# Patient Record
Sex: Female | Born: 1955 | Race: White | Hispanic: No | Smoking: Former smoker
Health system: Southern US, Community
[De-identification: ages and names within clinical notes are randomized; demographics above are authoritative.]

## PROBLEM LIST (undated history)

## (undated) DIAGNOSIS — F329 Major depressive disorder, single episode, unspecified: Secondary | ICD-10-CM

## (undated) DIAGNOSIS — F319 Bipolar disorder, unspecified: Secondary | ICD-10-CM

## (undated) DIAGNOSIS — R079 Chest pain, unspecified: Secondary | ICD-10-CM

## (undated) DIAGNOSIS — I10 Essential (primary) hypertension: Secondary | ICD-10-CM

## (undated) DIAGNOSIS — Z950 Presence of cardiac pacemaker: Secondary | ICD-10-CM

## (undated) DIAGNOSIS — G4700 Insomnia, unspecified: Secondary | ICD-10-CM

## (undated) DIAGNOSIS — M19049 Primary osteoarthritis, unspecified hand: Secondary | ICD-10-CM

## (undated) DIAGNOSIS — F321 Major depressive disorder, single episode, moderate: Secondary | ICD-10-CM

## (undated) DIAGNOSIS — G8929 Other chronic pain: Secondary | ICD-10-CM

## (undated) DIAGNOSIS — E785 Hyperlipidemia, unspecified: Secondary | ICD-10-CM

## (undated) DIAGNOSIS — I5020 Unspecified systolic (congestive) heart failure: Secondary | ICD-10-CM

## (undated) DIAGNOSIS — R5383 Other fatigue: Secondary | ICD-10-CM

## (undated) DIAGNOSIS — F32A Depression, unspecified: Secondary | ICD-10-CM

## (undated) DIAGNOSIS — R519 Headache, unspecified: Secondary | ICD-10-CM

## (undated) DIAGNOSIS — M542 Cervicalgia: Secondary | ICD-10-CM

## (undated) HISTORY — DX: Chest pain, unspecified: R07.9

## (undated) HISTORY — DX: Other chronic pain: G89.29

## (undated) HISTORY — PX: TUBAL LIGATION: SHX77

## (undated) HISTORY — DX: Presence of cardiac pacemaker: Z95.0

## (undated) HISTORY — DX: Bipolar disorder, unspecified: F31.9

## (undated) HISTORY — DX: Primary osteoarthritis, unspecified hand: M19.049

## (undated) HISTORY — PX: APPENDECTOMY: SHX54

## (undated) HISTORY — DX: Insomnia, unspecified: G47.00

## (undated) HISTORY — DX: Hyperlipidemia, unspecified: E78.5

## (undated) HISTORY — DX: Other fatigue: R53.83

## (undated) HISTORY — PX: NECK SURGERY: SHX720

## (undated) HISTORY — DX: Unspecified systolic (congestive) heart failure: I50.20

## (undated) HISTORY — DX: Major depressive disorder, single episode, moderate: F32.1

## (undated) HISTORY — PX: KIDNEY SURGERY: SHX687

## (undated) HISTORY — DX: Cervicalgia: M54.2

---

## 1997-06-11 ENCOUNTER — Ambulatory Visit (HOSPITAL_COMMUNITY): Admission: RE | Admit: 1997-06-11 | Discharge: 1997-06-11 | Payer: Self-pay | Admitting: Obstetrics and Gynecology

## 2000-04-06 ENCOUNTER — Encounter: Payer: Self-pay | Admitting: Family Medicine

## 2000-04-06 ENCOUNTER — Ambulatory Visit (HOSPITAL_COMMUNITY): Admission: RE | Admit: 2000-04-06 | Discharge: 2000-04-06 | Payer: Self-pay | Admitting: Family Medicine

## 2000-08-21 ENCOUNTER — Other Ambulatory Visit: Admission: RE | Admit: 2000-08-21 | Discharge: 2000-08-21 | Payer: Self-pay | Admitting: Family Medicine

## 2000-08-22 ENCOUNTER — Encounter: Admission: RE | Admit: 2000-08-22 | Discharge: 2000-08-22 | Payer: Self-pay | Admitting: Family Medicine

## 2000-08-22 ENCOUNTER — Encounter: Payer: Self-pay | Admitting: Family Medicine

## 2000-09-04 ENCOUNTER — Encounter: Payer: Self-pay | Admitting: Dermatology

## 2000-09-04 ENCOUNTER — Encounter: Admission: RE | Admit: 2000-09-04 | Discharge: 2000-09-04 | Payer: Self-pay | Admitting: Dermatology

## 2001-03-07 ENCOUNTER — Ambulatory Visit (HOSPITAL_COMMUNITY): Admission: RE | Admit: 2001-03-07 | Discharge: 2001-03-07 | Payer: Self-pay | Admitting: Internal Medicine

## 2001-03-07 ENCOUNTER — Encounter: Payer: Self-pay | Admitting: Internal Medicine

## 2001-04-03 ENCOUNTER — Emergency Department (HOSPITAL_COMMUNITY): Admission: EM | Admit: 2001-04-03 | Discharge: 2001-04-03 | Payer: Self-pay | Admitting: Emergency Medicine

## 2001-08-15 ENCOUNTER — Inpatient Hospital Stay (HOSPITAL_COMMUNITY): Admission: RE | Admit: 2001-08-15 | Discharge: 2001-08-16 | Payer: Self-pay | Admitting: Neurosurgery

## 2001-08-15 ENCOUNTER — Encounter: Payer: Self-pay | Admitting: Neurosurgery

## 2002-02-04 ENCOUNTER — Encounter: Admission: RE | Admit: 2002-02-04 | Discharge: 2002-05-05 | Payer: Self-pay | Admitting: Orthopedic Surgery

## 2002-05-24 ENCOUNTER — Emergency Department (HOSPITAL_COMMUNITY): Admission: EM | Admit: 2002-05-24 | Discharge: 2002-05-24 | Payer: Self-pay

## 2002-07-01 ENCOUNTER — Other Ambulatory Visit: Admission: RE | Admit: 2002-07-01 | Discharge: 2002-07-01 | Payer: Self-pay | Admitting: Family Medicine

## 2002-07-03 ENCOUNTER — Encounter: Payer: Self-pay | Admitting: Family Medicine

## 2002-07-03 ENCOUNTER — Ambulatory Visit (HOSPITAL_COMMUNITY): Admission: RE | Admit: 2002-07-03 | Discharge: 2002-07-03 | Payer: Self-pay | Admitting: Family Medicine

## 2002-07-04 ENCOUNTER — Encounter: Payer: Self-pay | Admitting: Family Medicine

## 2002-07-04 ENCOUNTER — Encounter: Admission: RE | Admit: 2002-07-04 | Discharge: 2002-07-04 | Payer: Self-pay | Admitting: Family Medicine

## 2002-08-05 ENCOUNTER — Encounter: Admission: RE | Admit: 2002-08-05 | Discharge: 2002-08-05 | Payer: Self-pay | Admitting: Obstetrics and Gynecology

## 2002-08-05 ENCOUNTER — Other Ambulatory Visit: Admission: RE | Admit: 2002-08-05 | Discharge: 2002-08-05 | Payer: Self-pay | Admitting: Obstetrics and Gynecology

## 2002-09-02 ENCOUNTER — Encounter: Admission: RE | Admit: 2002-09-02 | Discharge: 2002-09-02 | Payer: Self-pay | Admitting: Obstetrics and Gynecology

## 2003-08-14 ENCOUNTER — Emergency Department (HOSPITAL_COMMUNITY): Admission: EM | Admit: 2003-08-14 | Discharge: 2003-08-14 | Payer: Self-pay | Admitting: Emergency Medicine

## 2003-08-14 IMAGING — CR DG CERVICAL SPINE COMPLETE 4+V
5 series · 5 of 5 positions shown · non-contrast
Comparison: none

CLINICAL DATA: CERVICAL SPINE SERIES
 The patient has anterior fusion C3-5.  Degenerative changes are seen at all levels.  There is 4 mm of anterolisthesis of C2 on C3.  Though this is likely degenerative, flexion and extension views could be helpful to exclude mobility at this level.  No evidence for acute fracture or dislocation.

[view not recorded (1 of 5)]
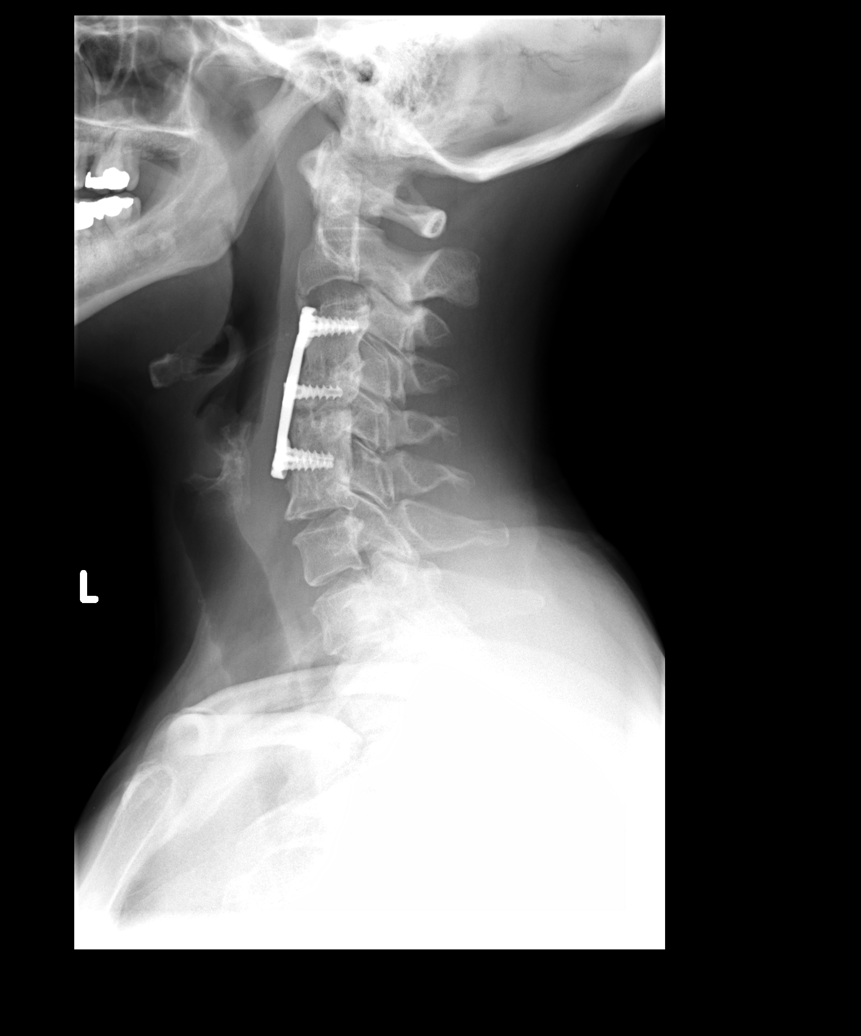

[view not recorded (2 of 5)]
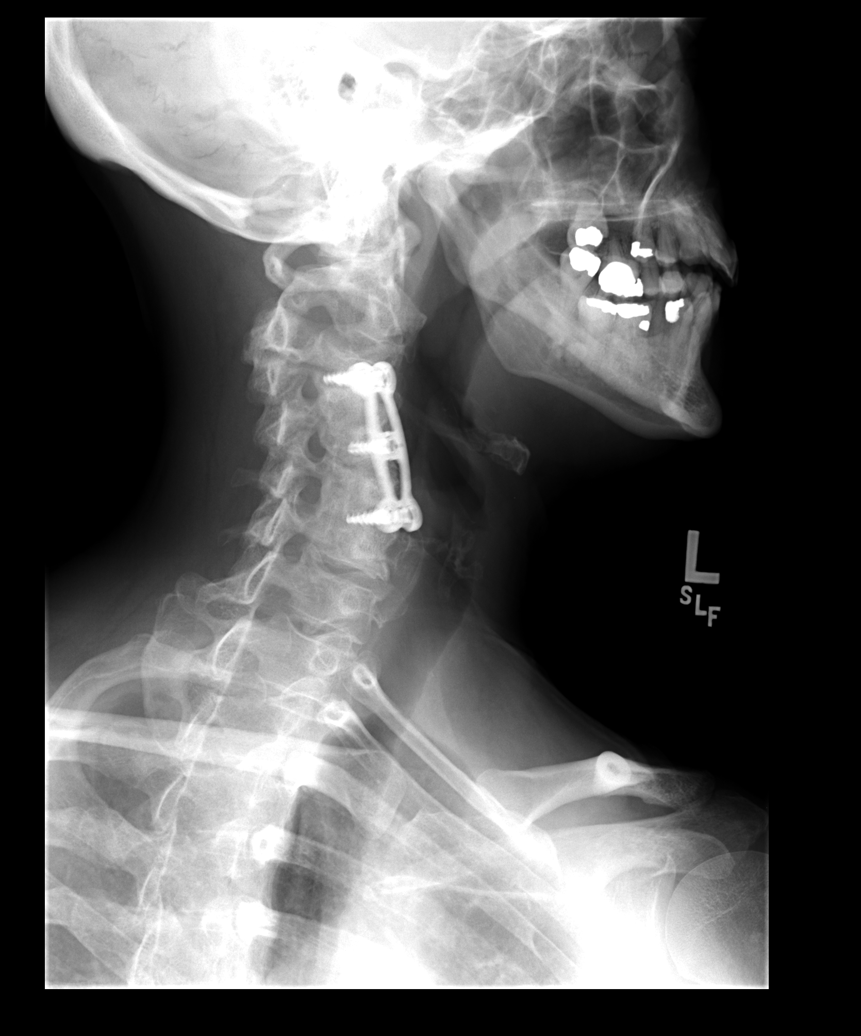

[view not recorded (3 of 5)]
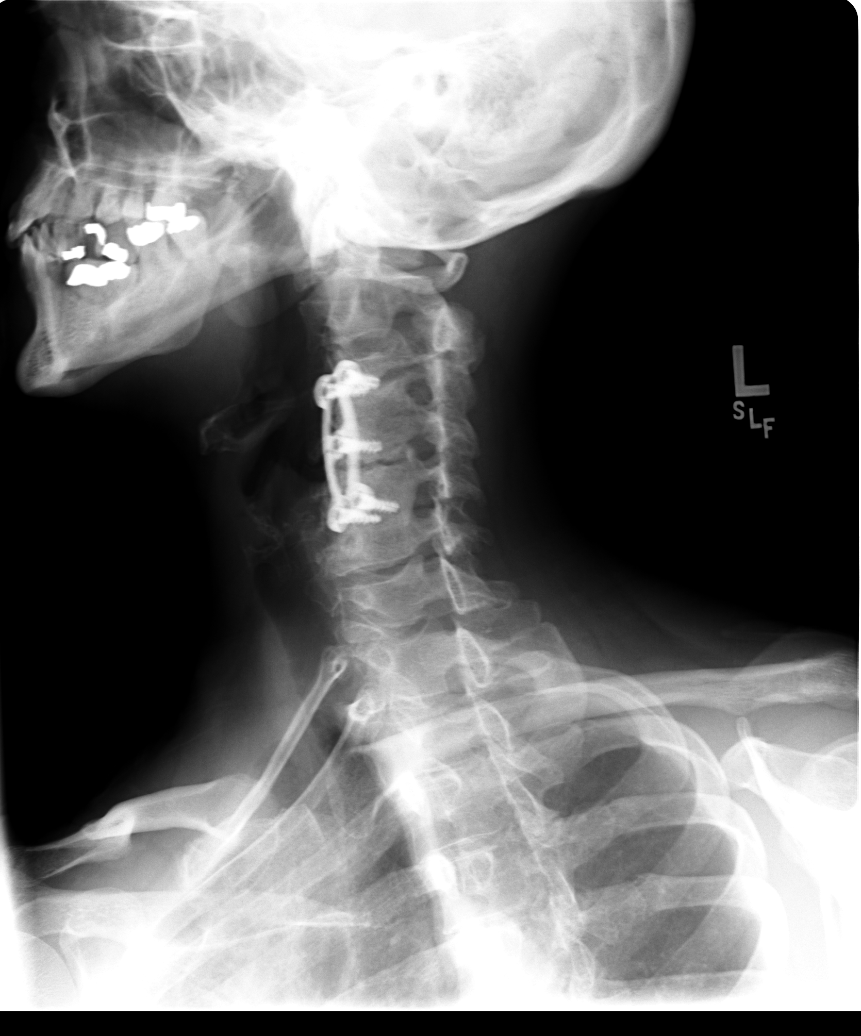

[view not recorded (4 of 5)]
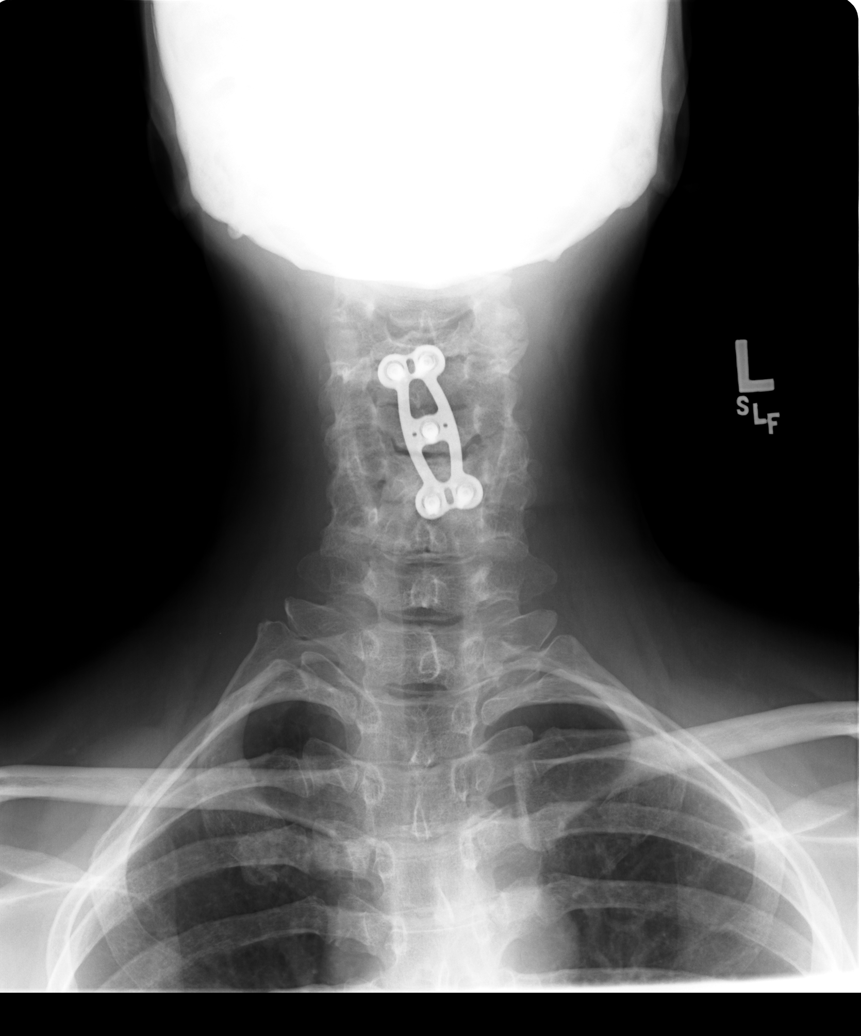

[view not recorded (5 of 5)]
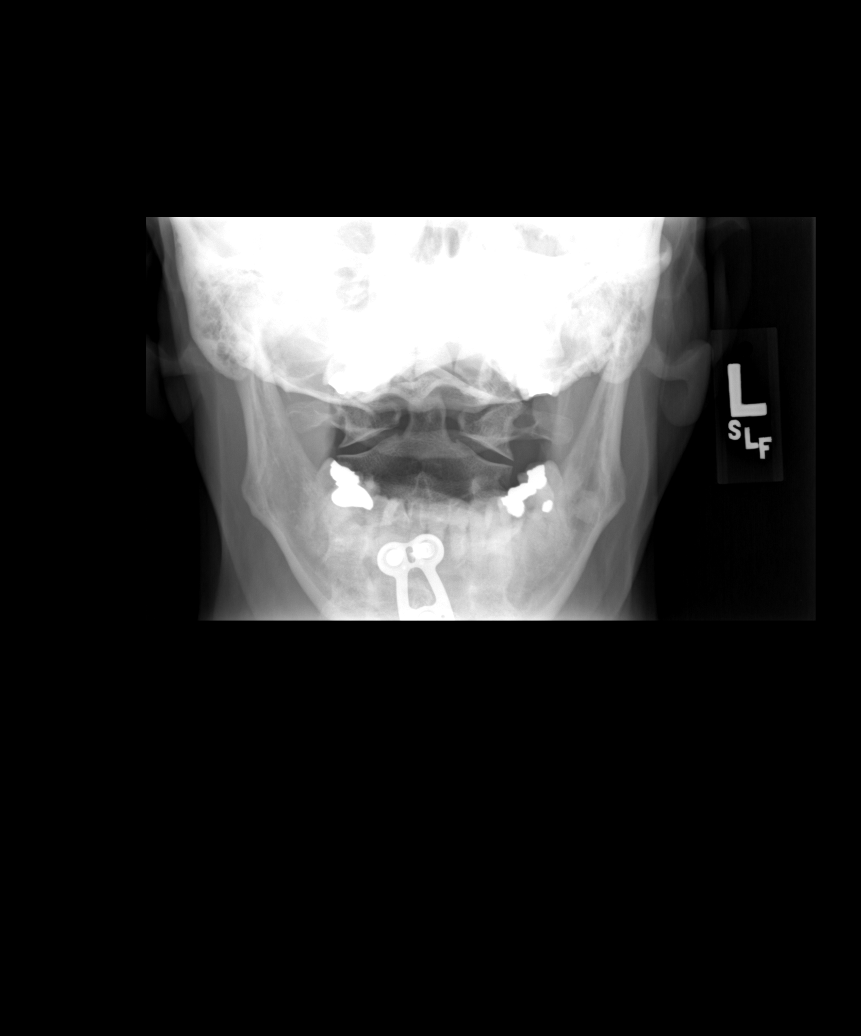

[5 of 5 positions shown; findings below may reference images not displayed]

IMPRESSION: Degenerative changes and postoperative changes.
 Anterolisthesis of C2 on C3 by 4 mm.  Consider follow-up flexion and extension views for further evaluation.

## 2004-04-16 ENCOUNTER — Ambulatory Visit: Payer: Self-pay | Admitting: Internal Medicine

## 2004-04-16 ENCOUNTER — Observation Stay (HOSPITAL_COMMUNITY): Admission: EM | Admit: 2004-04-16 | Discharge: 2004-04-19 | Payer: Self-pay | Admitting: Family Medicine

## 2004-04-16 IMAGING — CR DG CHEST 1V PORT
1 series · 1 of 1 positions shown · non-contrast
Comparison: None

CLINICAL DATA: Chest pain, shortness of breath

PORTABLE CHEST - 1 VIEW:

[view not recorded]
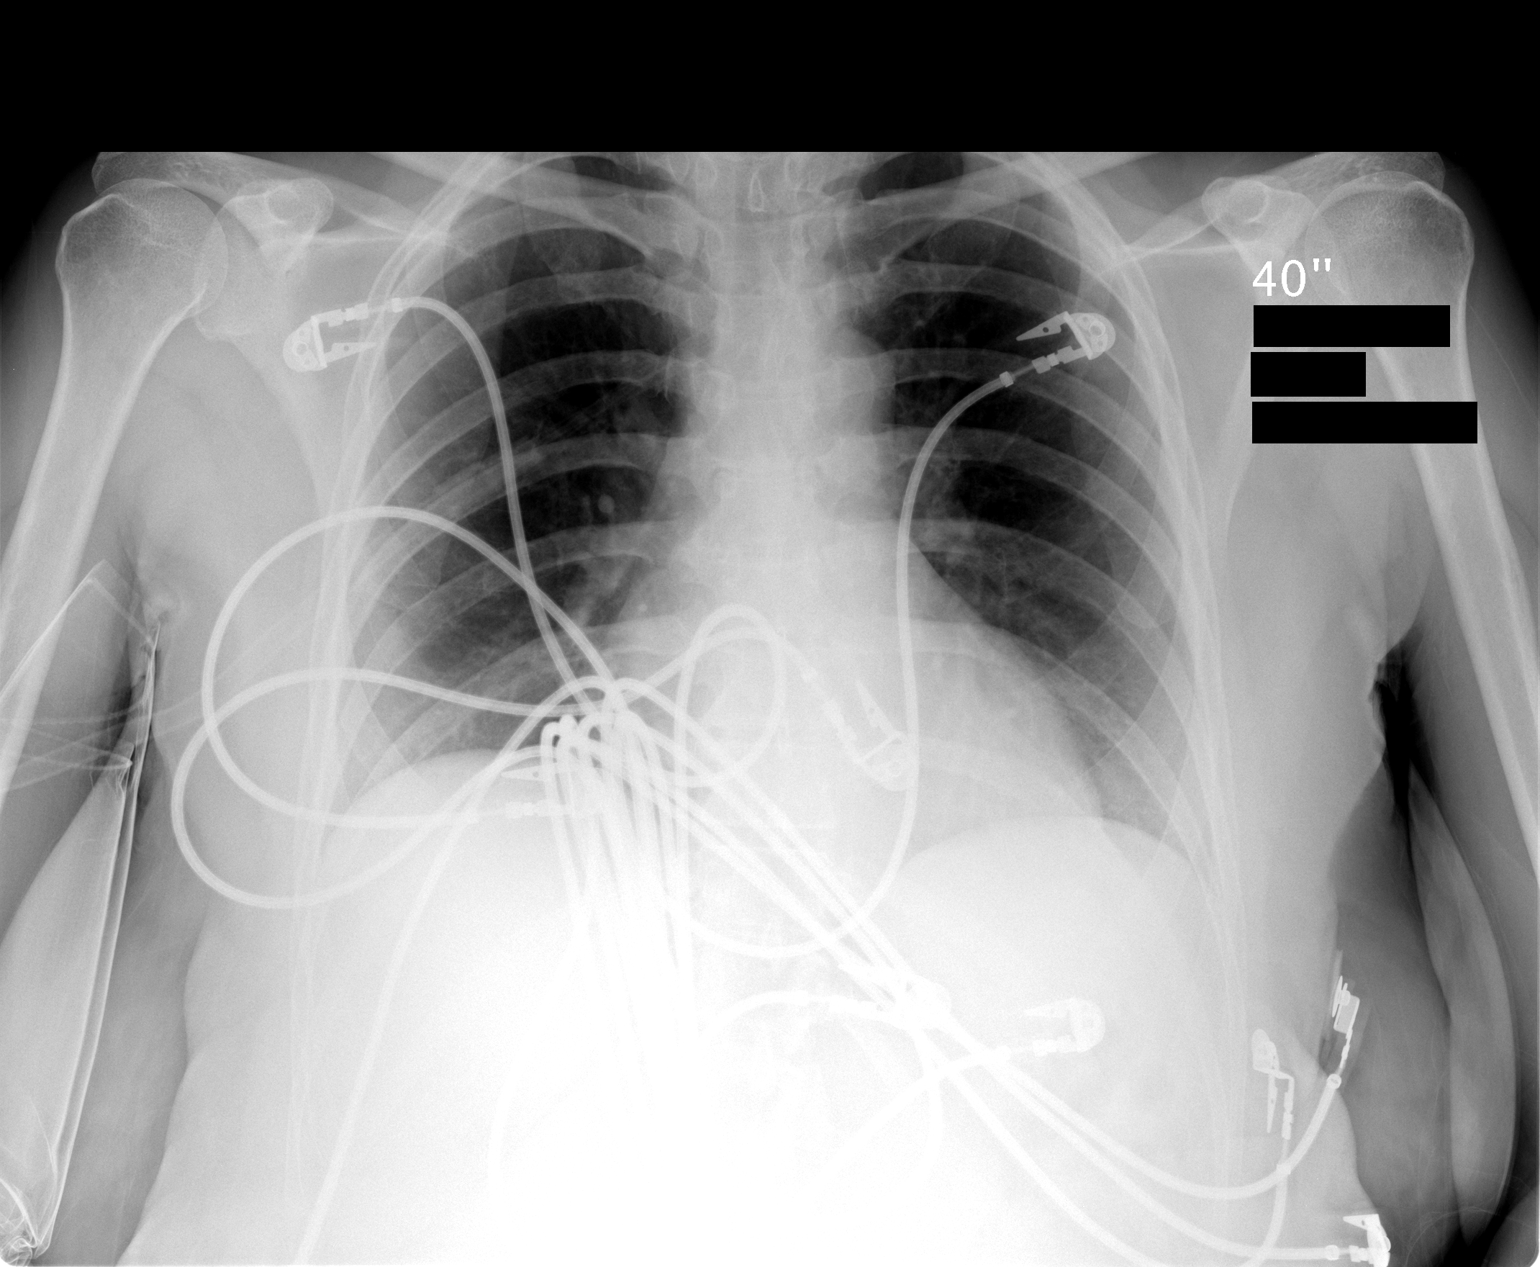

[1 of 1 positions shown; findings below may reference images not displayed]

FINDINGS: Heart and mediastinal contours are within normal limits. Lungs are
clear. No effusions.
IMPRESSION: No active disease.

## 2004-04-17 IMAGING — CR DG CHEST 2V
2 series · 2 of 2 positions shown · non-contrast
Comparison: [DATE].

CLINICAL DATA: Chest pain. 
 PA AND LATERAL CHEST, [DATE]:

[view not recorded (1 of 2)]
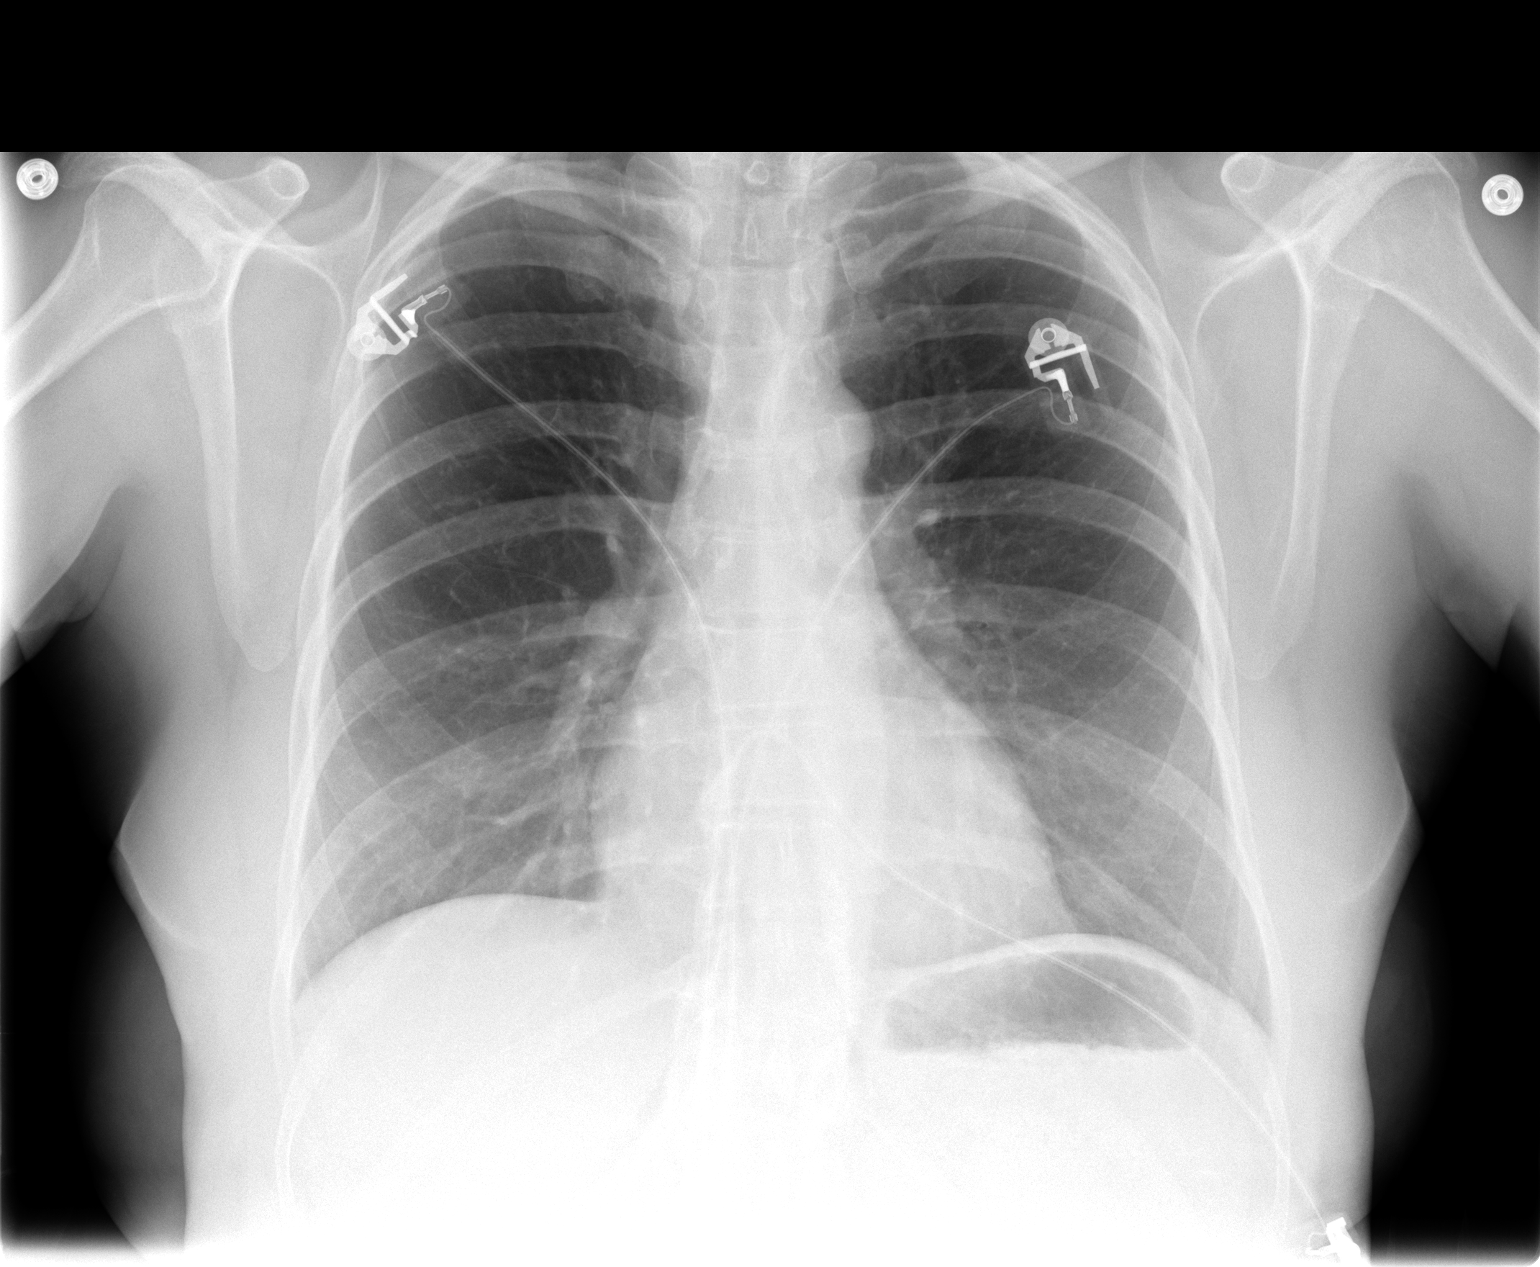

[view not recorded (2 of 2)]
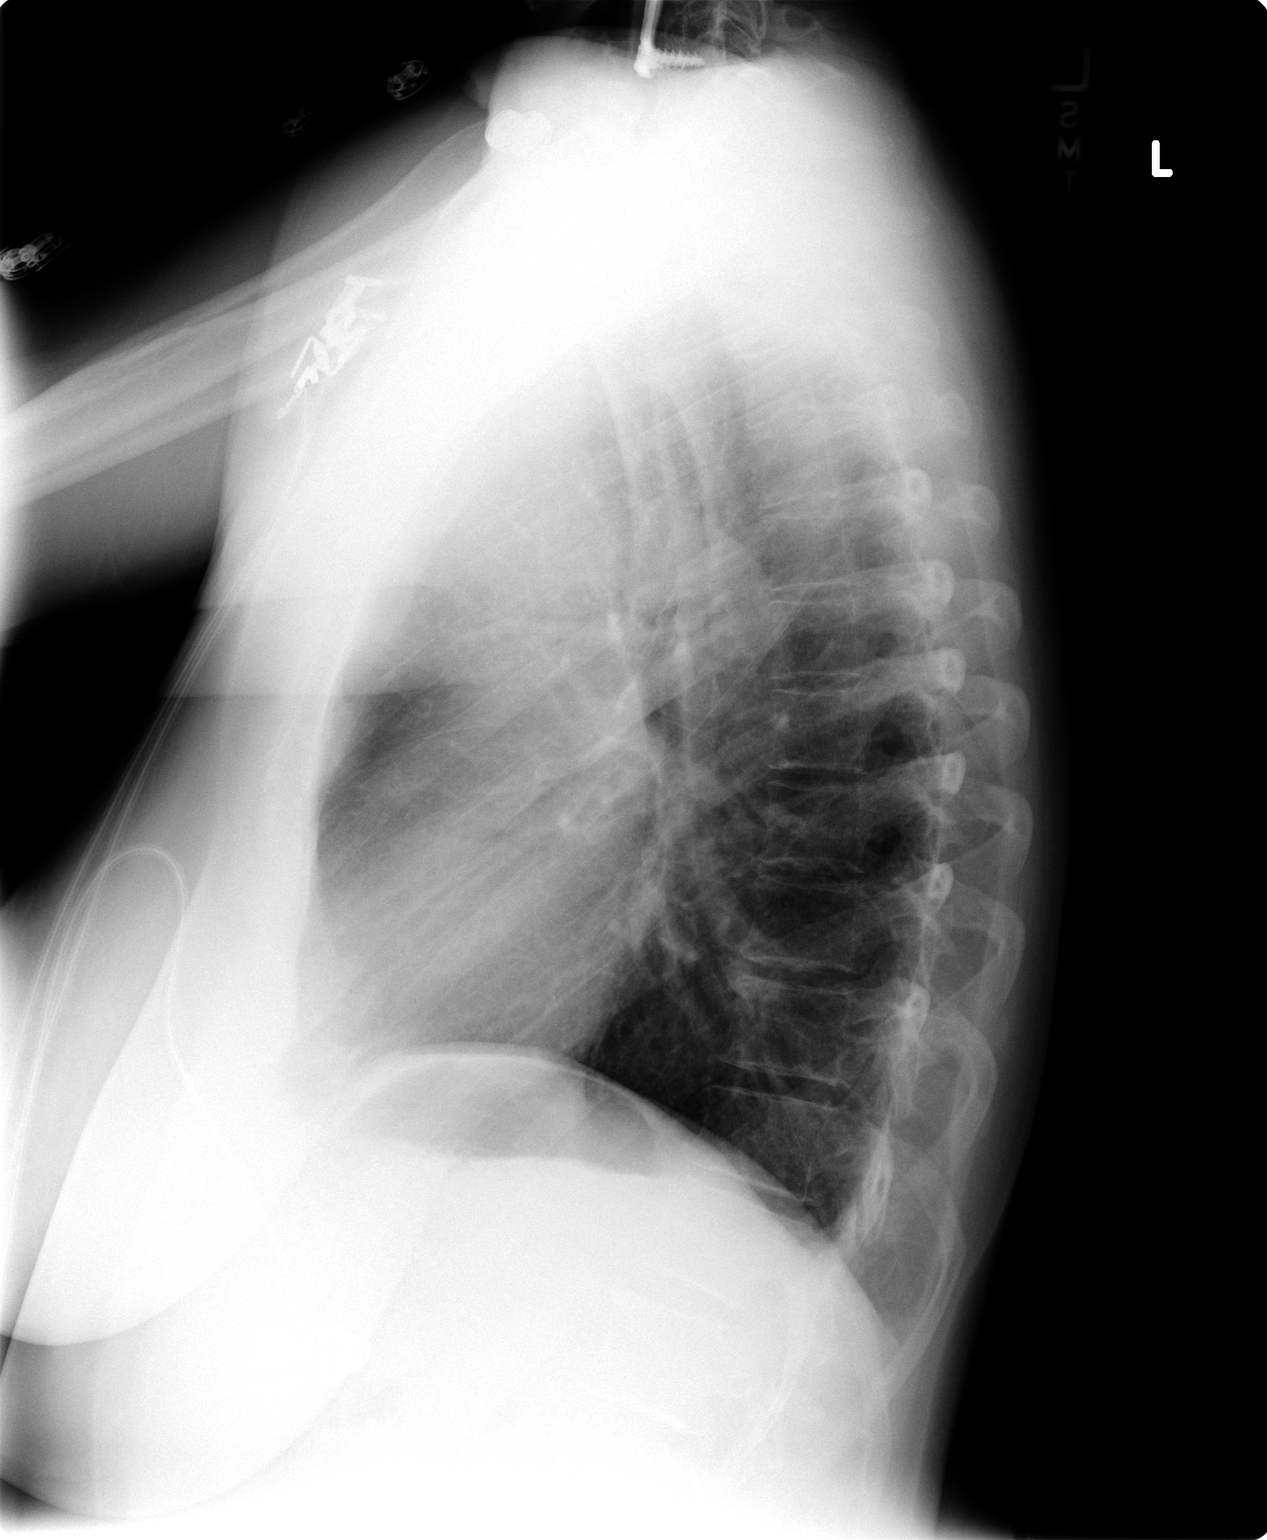

[2 of 2 positions shown; findings below may reference images not displayed]

The lungs are clear.  Cardiac and mediastinal contours are normal.  Osseous structures unremarkable.
IMPRESSION: Negative for acute cardiac or pulmonary process.

## 2004-04-29 ENCOUNTER — Ambulatory Visit: Payer: Self-pay | Admitting: Internal Medicine

## 2004-05-30 ENCOUNTER — Ambulatory Visit: Payer: Self-pay | Admitting: Internal Medicine

## 2004-05-31 ENCOUNTER — Ambulatory Visit: Payer: Self-pay | Admitting: Internal Medicine

## 2004-06-09 ENCOUNTER — Ambulatory Visit: Payer: Self-pay | Admitting: Internal Medicine

## 2004-06-20 ENCOUNTER — Encounter: Admission: RE | Admit: 2004-06-20 | Discharge: 2004-06-20 | Payer: Self-pay | Admitting: Internal Medicine

## 2010-08-24 ENCOUNTER — Inpatient Hospital Stay (INDEPENDENT_AMBULATORY_CARE_PROVIDER_SITE_OTHER)
Admission: RE | Admit: 2010-08-24 | Discharge: 2010-08-24 | Disposition: A | Discharge status: Home / Self Care | Payer: PRIVATE HEALTH INSURANCE | Source: Ambulatory Visit | Attending: Family Medicine | Admitting: Family Medicine

## 2010-08-24 DIAGNOSIS — S93429A Sprain of deltoid ligament of unspecified ankle, initial encounter: Secondary | ICD-10-CM

## 2011-11-22 ENCOUNTER — Other Ambulatory Visit (HOSPITAL_COMMUNITY): Payer: Self-pay | Admitting: Nurse Practitioner

## 2011-11-22 ENCOUNTER — Other Ambulatory Visit (HOSPITAL_COMMUNITY): Payer: Self-pay | Admitting: Psychiatry

## 2011-11-22 DIAGNOSIS — Z1231 Encounter for screening mammogram for malignant neoplasm of breast: Secondary | ICD-10-CM

## 2011-12-12 ENCOUNTER — Ambulatory Visit (HOSPITAL_COMMUNITY)
Admission: RE | Admit: 2011-12-12 | Discharge: 2011-12-12 | Disposition: A | Discharge status: Home / Self Care | Payer: Self-pay | Source: Ambulatory Visit | Attending: Nurse Practitioner | Admitting: Nurse Practitioner

## 2011-12-12 DIAGNOSIS — Z1231 Encounter for screening mammogram for malignant neoplasm of breast: Secondary | ICD-10-CM

## 2011-12-12 IMAGING — MG MM DIGITAL SCREENING BILAT
4 series · 4 of 4 positions shown · non-contrast
Comparison: Previous exam dated [DATE]..

CLINICAL DATA: Screening.

DIGITAL BILATERAL SCREENING MAMMOGRAM WITH CAD

[R CC]
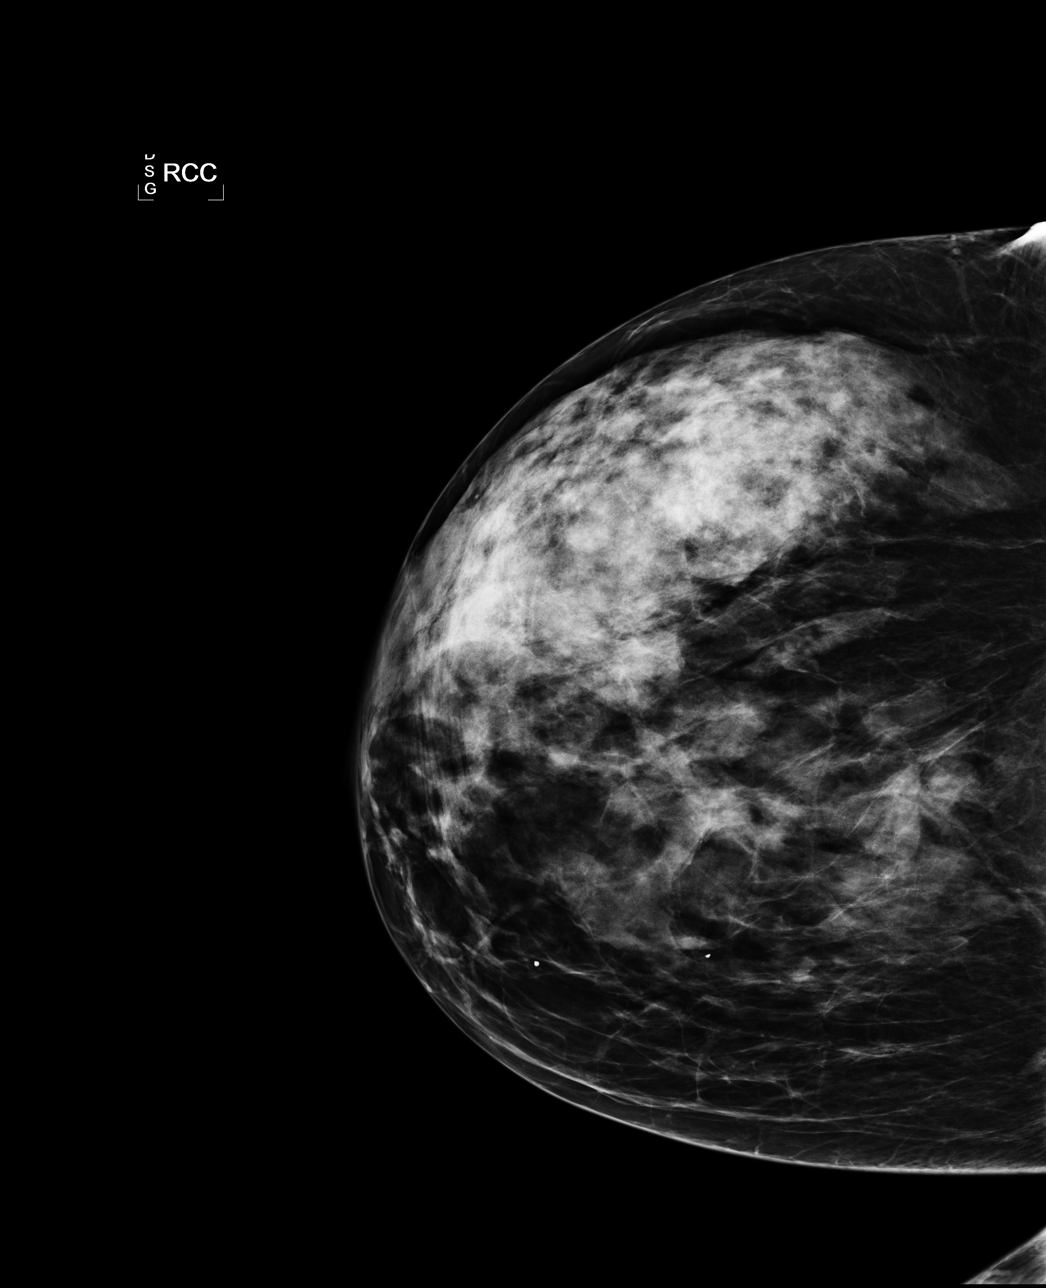

[R MLO]
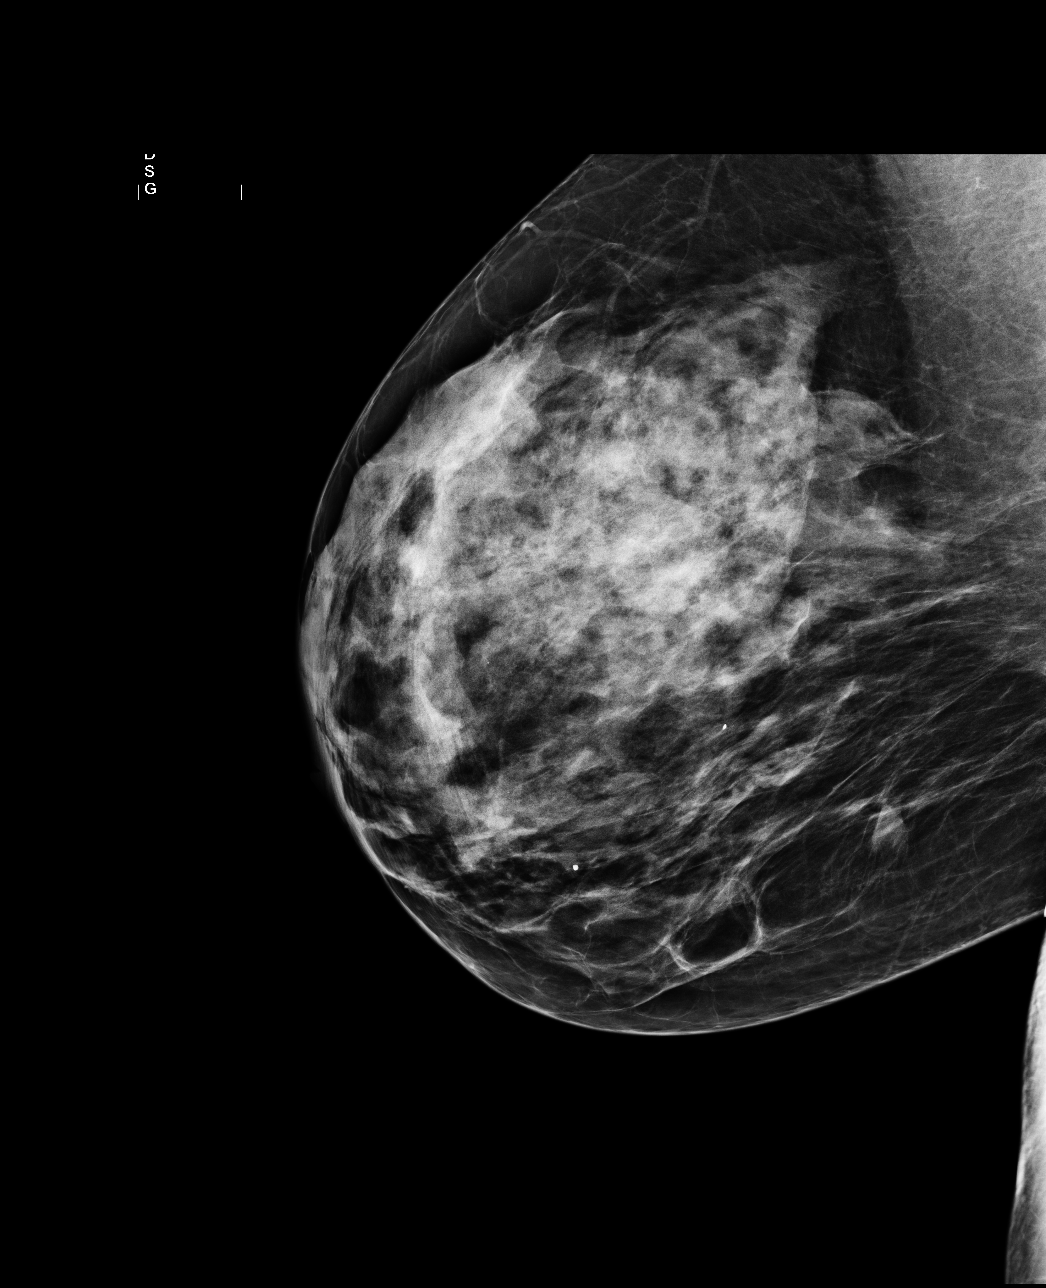

[L CC]
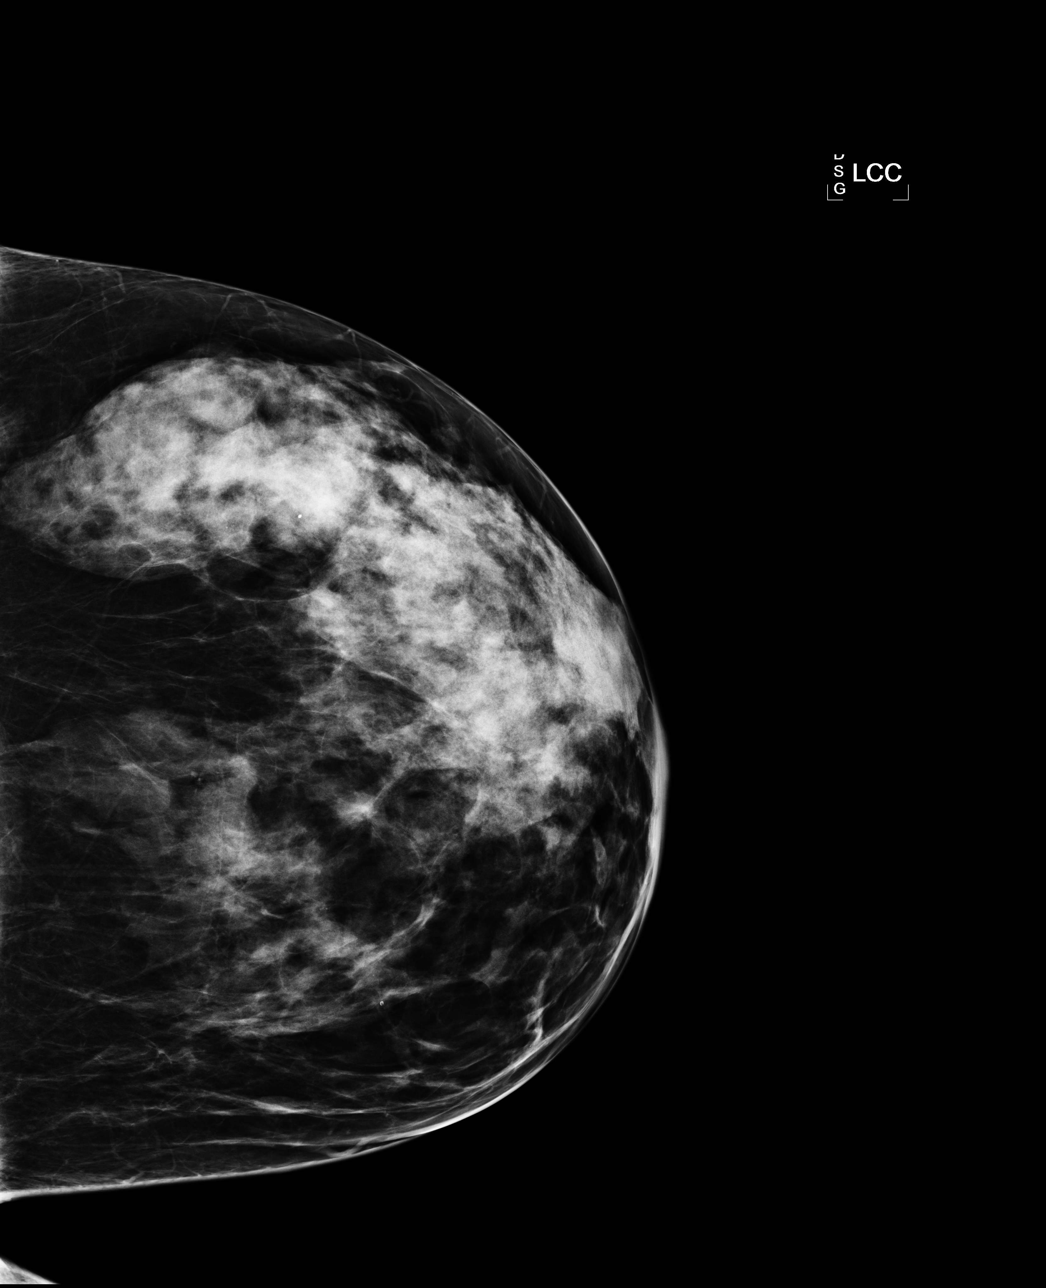

[L MLO]
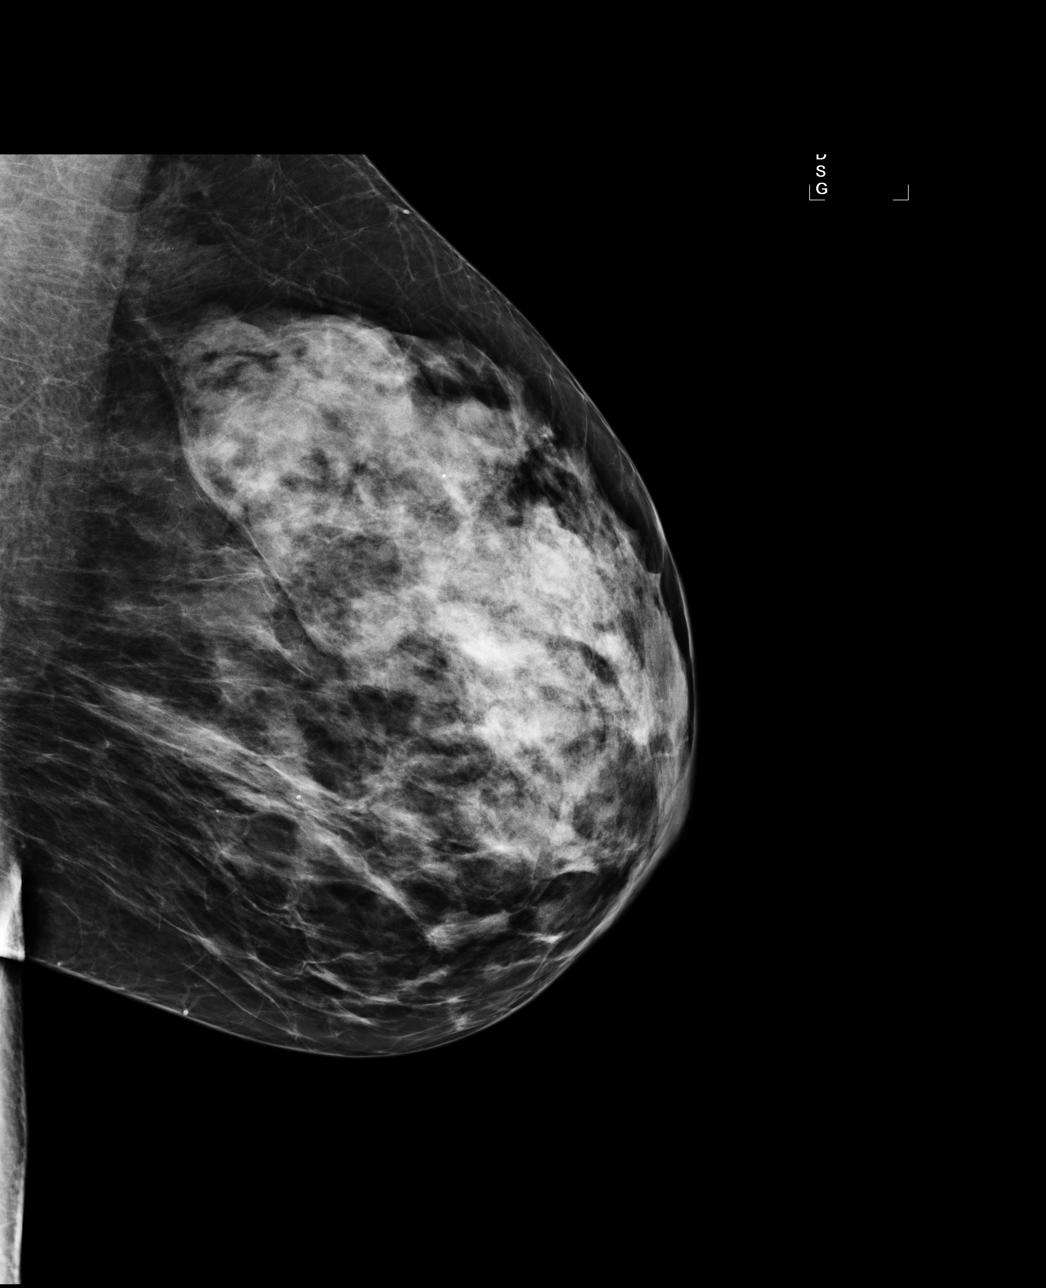

[4 of 4 positions shown; findings below may reference images not displayed]

FINDINGS: The breast tissue is extremely dense. No suspicious
masses, architectural distortion, or calcifications are present.

Images were processed with CAD.
IMPRESSION: No mammographic evidence of malignancy.

A result letter of this screening mammogram will be mailed directly
to the patient.

RECOMMENDATION:
Screening mammogram in one year. (Code:[S6])

BI-RADS CATEGORY 1:  Negative.

## 2013-03-07 ENCOUNTER — Other Ambulatory Visit (HOSPITAL_COMMUNITY): Payer: Self-pay | Admitting: Nurse Practitioner

## 2013-03-07 DIAGNOSIS — Z1231 Encounter for screening mammogram for malignant neoplasm of breast: Secondary | ICD-10-CM

## 2013-04-01 ENCOUNTER — Ambulatory Visit (HOSPITAL_COMMUNITY)
Admission: RE | Admit: 2013-04-01 | Discharge: 2013-04-01 | Disposition: A | Discharge status: Home / Self Care | Payer: Self-pay | Source: Ambulatory Visit | Attending: Nurse Practitioner | Admitting: Nurse Practitioner

## 2013-04-01 DIAGNOSIS — Z1231 Encounter for screening mammogram for malignant neoplasm of breast: Secondary | ICD-10-CM

## 2013-04-01 IMAGING — MG MM DIGITAL SCREENING BILAT
4 series · 4 of 4 positions shown · non-contrast
Comparison: Previous Exam(s)

CLINICAL DATA: Screening.

EXAM:
DIGITAL SCREENING BILATERAL MAMMOGRAM WITH CAD

[R CC]
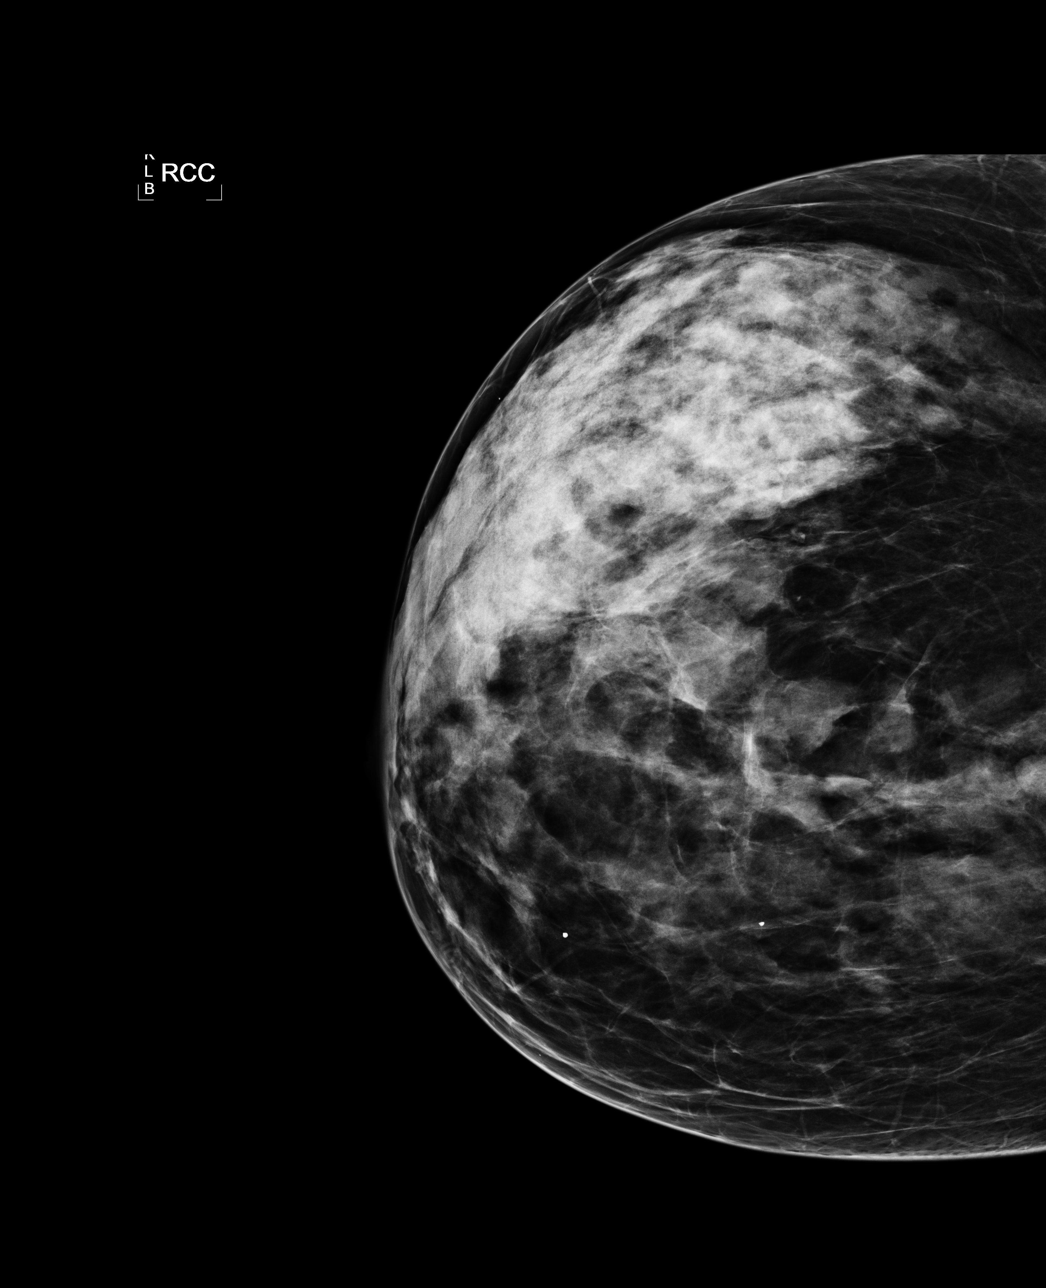

[R MLO]
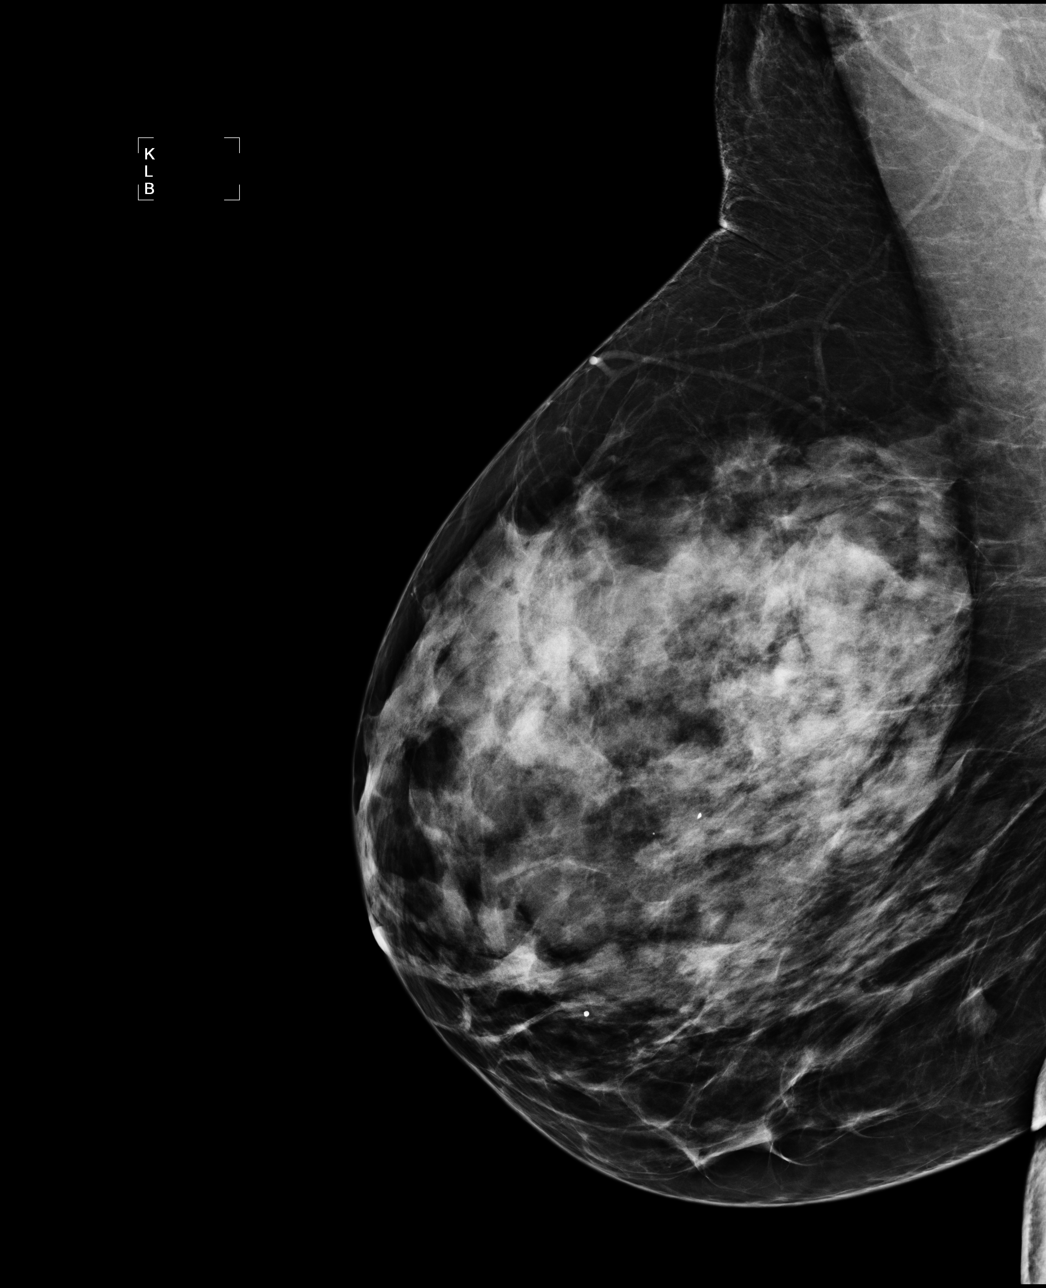

[L CC]
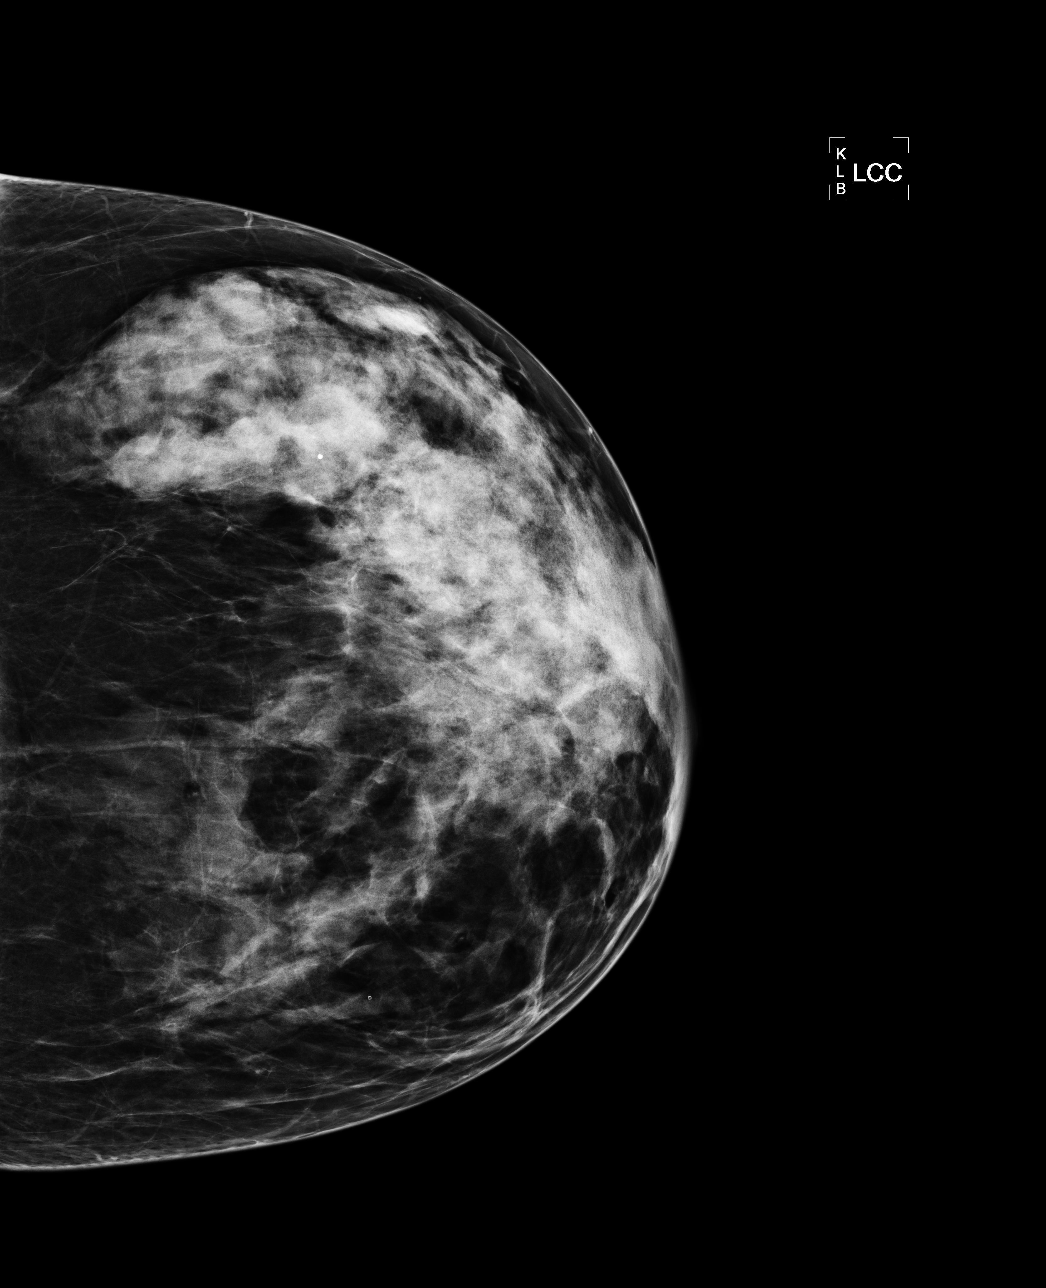

[L MLO]
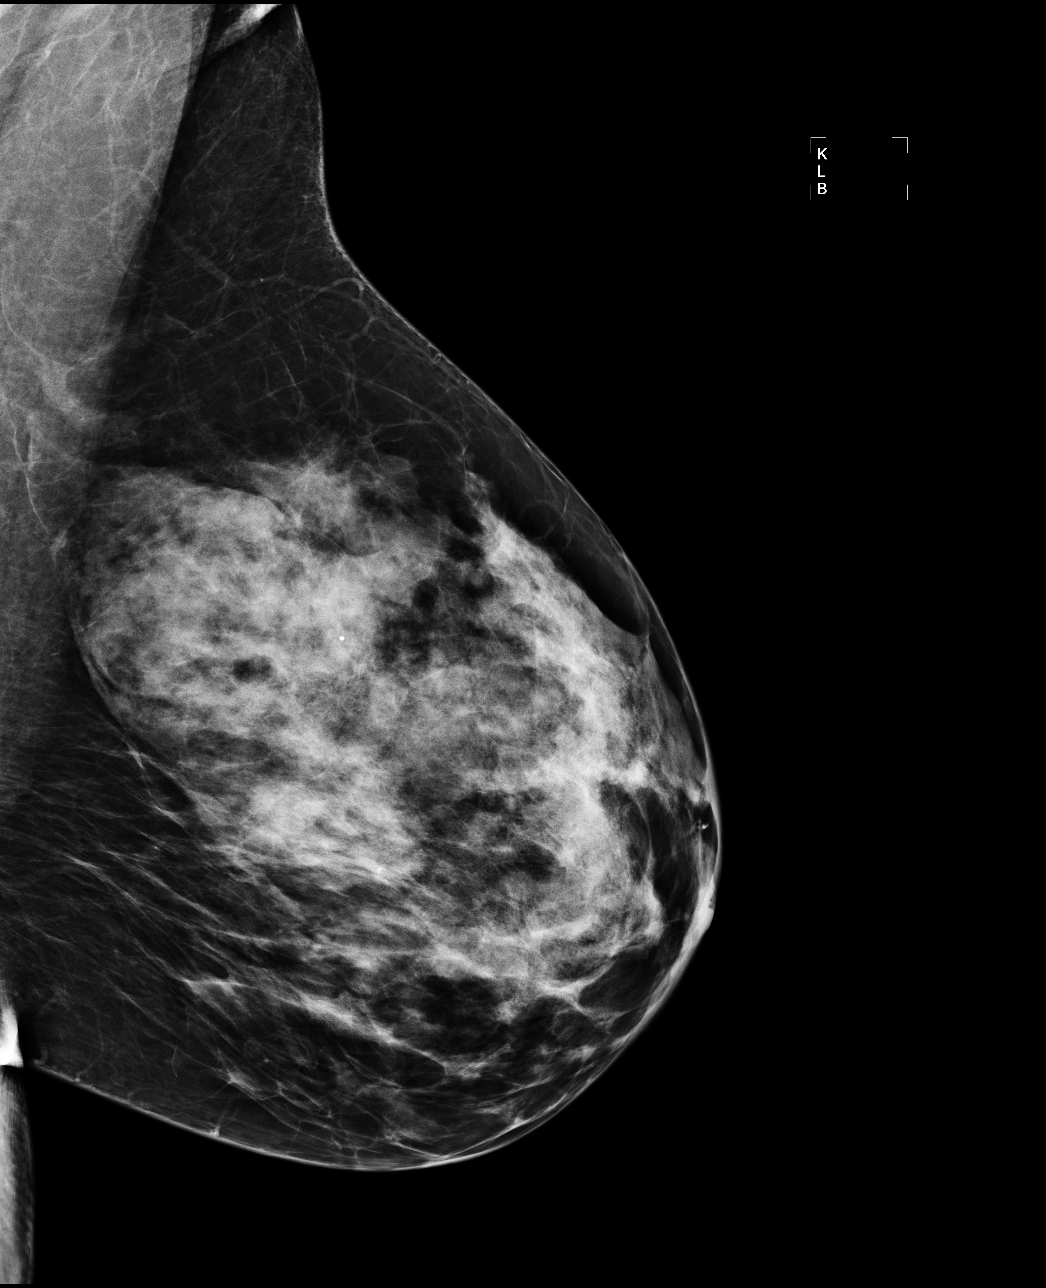

[4 of 4 positions shown; findings below may reference images not displayed]

ACR Breast Density Category c: The breasts are heterogeneously
dense, which may obscure small masses.
FINDINGS: In the right breast, a possible asymmetry warrants further
evaluation with spot compression views and possibly ultrasound. In
the left breast, no suspicious masses or malignant type
calcifications are identified. Images were processed with CAD.
IMPRESSION: Further evaluation is suggested for possible asymmetry in the right
breast.

RECOMMENDATION:
Diagnostic mammogram and possibly ultrasound of the right breast.
(Code:[9W])

The patient will be contacted regarding the findings, and additional
imaging will be scheduled.

BI-RADS CATEGORY  0: Incomplete. Need additional imaging evaluation
and/or prior mammograms for comparison.

## 2013-04-08 ENCOUNTER — Other Ambulatory Visit: Payer: Self-pay | Admitting: Nurse Practitioner

## 2013-04-08 DIAGNOSIS — R928 Other abnormal and inconclusive findings on diagnostic imaging of breast: Secondary | ICD-10-CM

## 2013-05-06 ENCOUNTER — Encounter (HOSPITAL_COMMUNITY): Payer: Self-pay

## 2013-05-06 ENCOUNTER — Encounter (INDEPENDENT_AMBULATORY_CARE_PROVIDER_SITE_OTHER): Payer: Self-pay

## 2013-05-06 ENCOUNTER — Ambulatory Visit (HOSPITAL_COMMUNITY)
Admission: RE | Admit: 2013-05-06 | Discharge: 2013-05-06 | Disposition: A | Discharge status: Home / Self Care | Payer: PRIVATE HEALTH INSURANCE | Source: Ambulatory Visit | Attending: Obstetrics and Gynecology | Admitting: Obstetrics and Gynecology

## 2013-05-06 VITALS — BP 140/92 | Temp 97.8°F | Ht 62.0 in | Wt 152.8 lb

## 2013-05-06 DIAGNOSIS — Z1239 Encounter for other screening for malignant neoplasm of breast: Secondary | ICD-10-CM

## 2013-05-06 HISTORY — DX: Depression, unspecified: F32.A

## 2013-05-06 HISTORY — DX: Major depressive disorder, single episode, unspecified: F32.9

## 2013-05-06 HISTORY — DX: Essential (primary) hypertension: I10

## 2013-05-06 NOTE — Progress Notes (Addendum)
Patient referred to [facility] by the [facility] due to needing additional imaging of there right breast. Screening mammogram completed 04/01/2013 at [facility].  Pap Smear:    Pap smear not completed today. Last Pap smear was in 2013 and normal per patient. Per patient has no history of an abnormal Pap smear. No Pap smear results in EPIC.  Physical exam: Breasts Breasts symmetrical. No skin abnormalities bilateral breasts. Bilateral nipple inversion per patient is normal for her. No nipple discharge bilateral breasts. No lymphadenopathy. No lumps palpated bilateral breasts. No complaints of pain or tenderness on exam. Referred patient to the [facility] for right breast diagnostic mammogram per recommendation. Appointment scheduled for Friday, May 09, 2013 at 1245.        Pelvic/Bimanual No Pap smear completed today since last Pap smear was in 2013 per patient. Pap smear not indicated per BCCCP guidelines.

## 2013-05-06 NOTE — Addendum Note (Signed)
Encounter addended by: Saintclair Halsted, RN on: 05/06/2013  1:51 PM<BR>     Documentation filed: Visit Diagnoses, Notes Section

## 2013-05-06 NOTE — Patient Instructions (Signed)
Taught Anita Diaz how to perform BSE and gave educational materials to take home. Patient did not need a Pap smear today due to last Pap smear was in 2013 per patient. Let her know BCCCP will cover Pap smears every 3 years unless has a history of abnormal Pap smears and that her next Pap smear is due next year. Referred patient to the [facility] for right breast diagnostic mammogram per recommendation. Appointment scheduled for Friday, May 09, 2013 at 1245. Patient aware of appointment and will be there.  Anita Diaz verbalized understanding.  Adhya Cocco, Kathaleen Maser, RN 1:25 PM

## 2013-05-09 ENCOUNTER — Ambulatory Visit
Admission: RE | Admit: 2013-05-09 | Discharge: 2013-05-09 | Disposition: A | Discharge status: Home / Self Care | Payer: No Typology Code available for payment source | Source: Ambulatory Visit | Attending: Nurse Practitioner | Admitting: Nurse Practitioner

## 2013-05-09 DIAGNOSIS — R928 Other abnormal and inconclusive findings on diagnostic imaging of breast: Secondary | ICD-10-CM

## 2013-11-25 ENCOUNTER — Encounter: Payer: Self-pay | Admitting: *Deleted

## 2013-11-27 ENCOUNTER — Encounter: Payer: Self-pay | Admitting: Nurse Practitioner

## 2013-11-27 ENCOUNTER — Ambulatory Visit (INDEPENDENT_AMBULATORY_CARE_PROVIDER_SITE_OTHER): Payer: No Typology Code available for payment source | Admitting: Nurse Practitioner

## 2013-11-27 VITALS — BP 102/66 | HR 51 | Temp 98.2°F | Resp 10 | Ht 61.0 in | Wt 150.0 lb

## 2013-11-27 DIAGNOSIS — G4700 Insomnia, unspecified: Secondary | ICD-10-CM

## 2013-11-27 DIAGNOSIS — F3289 Other specified depressive episodes: Secondary | ICD-10-CM

## 2013-11-27 DIAGNOSIS — F329 Major depressive disorder, single episode, unspecified: Secondary | ICD-10-CM

## 2013-11-27 DIAGNOSIS — G8929 Other chronic pain: Secondary | ICD-10-CM

## 2013-11-27 DIAGNOSIS — F319 Bipolar disorder, unspecified: Secondary | ICD-10-CM

## 2013-11-27 DIAGNOSIS — E785 Hyperlipidemia, unspecified: Secondary | ICD-10-CM

## 2013-11-27 DIAGNOSIS — F32A Depression, unspecified: Secondary | ICD-10-CM

## 2013-11-27 DIAGNOSIS — I10 Essential (primary) hypertension: Secondary | ICD-10-CM

## 2013-11-27 DIAGNOSIS — F321 Major depressive disorder, single episode, moderate: Secondary | ICD-10-CM | POA: Insufficient documentation

## 2013-11-27 MED ORDER — RISPERIDONE 2 MG PO TABS: 2.0000 mg | ORAL_TABLET | Freq: Every day | ORAL | Status: DC

## 2013-11-27 MED ORDER — TRAZODONE HCL 100 MG PO TABS: 100.0000 mg | ORAL_TABLET | Freq: Every day | ORAL | Status: DC

## 2013-11-27 MED ORDER — SIMVASTATIN 10 MG PO TABS: 10.0000 mg | ORAL_TABLET | Freq: Every day | ORAL | Status: DC

## 2013-11-27 MED ORDER — CITALOPRAM HYDROBROMIDE 40 MG PO TABS: 40.0000 mg | ORAL_TABLET | Freq: Every day | ORAL | Status: DC

## 2013-11-27 MED ORDER — LISINOPRIL 20 MG PO TABS: 20.0000 mg | ORAL_TABLET | Freq: Every day | ORAL | Status: DC

## 2013-11-27 MED ORDER — MELOXICAM 15 MG PO TABS: 15.0000 mg | ORAL_TABLET | Freq: Every day | ORAL | Status: DC
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
## 2013-11-27 NOTE — Patient Instructions (Signed)
Cont medications follow up in 2 months for EV  Will check fasting blood work prior to visit in 2 months

## 2014-01-27 ENCOUNTER — Other Ambulatory Visit: Payer: No Typology Code available for payment source

## 2014-01-27 ENCOUNTER — Other Ambulatory Visit: Payer: Self-pay | Admitting: Nurse Practitioner

## 2014-01-27 DIAGNOSIS — G8929 Other chronic pain: Secondary | ICD-10-CM

## 2014-01-27 DIAGNOSIS — E785 Hyperlipidemia, unspecified: Secondary | ICD-10-CM

## 2014-01-28 LAB — CBC WITH DIFFERENTIAL
Basophils Absolute: 0 10*3/uL (ref 0.0–0.2)
Basos: 0 %
EOS ABS: 0 10*3/uL (ref 0.0–0.4)
Eos: 0 %
HCT: 40.6 % (ref 34.0–46.6)
Hemoglobin: 13.8 g/dL (ref 11.1–15.9)
IMMATURE GRANS (ABS): 0 10*3/uL (ref 0.0–0.1)
IMMATURE GRANULOCYTES: 0 %
LYMPHS ABS: 1.4 10*3/uL (ref 0.7–3.1)
Lymphs: 12 %
MCH: 29.2 pg (ref 26.6–33.0)
MCHC: 34 g/dL (ref 31.5–35.7)
MCV: 86 fL (ref 79–97)
MONOS ABS: 0.8 10*3/uL (ref 0.1–0.9)
Monocytes: 7 %
NEUTROS PCT: 81 %
Neutrophils Absolute: 8.8 10*3/uL — ABNORMAL HIGH (ref 1.4–7.0)
PLATELETS: 353 10*3/uL (ref 150–379)
RBC: 4.72 x10E6/uL (ref 3.77–5.28)
RDW: 13.4 % (ref 12.3–15.4)
WBC: 11 10*3/uL — ABNORMAL HIGH (ref 3.4–10.8)

## 2014-01-28 LAB — COMPREHENSIVE METABOLIC PANEL
ALBUMIN: 5 g/dL (ref 3.5–5.5)
ALT: 14 IU/L (ref 0–32)
AST: 15 IU/L (ref 0–40)
Albumin/Globulin Ratio: 2.1 (ref 1.1–2.5)
Alkaline Phosphatase: 67 IU/L (ref 39–117)
BILIRUBIN TOTAL: 0.7 mg/dL (ref 0.0–1.2)
BUN/Creatinine Ratio: 12 (ref 9–23)
BUN: 12 mg/dL (ref 6–24)
CALCIUM: 9.8 mg/dL (ref 8.7–10.2)
CHLORIDE: 97 mmol/L (ref 97–108)
CO2: 22 mmol/L (ref 18–29)
Creatinine, Ser: 1.02 mg/dL — ABNORMAL HIGH (ref 0.57–1.00)
GFR, EST AFRICAN AMERICAN: 71 mL/min/{1.73_m2} (ref >59)
GFR, EST NON AFRICAN AMERICAN: 61 mL/min/{1.73_m2} (ref >59)
GLUCOSE: 98 mg/dL (ref 65–99)
Globulin, Total: 2.4 g/dL (ref 1.5–4.5)
Potassium: 4.5 mmol/L (ref 3.5–5.2)
Sodium: 138 mmol/L (ref 134–144)
TOTAL PROTEIN: 7.4 g/dL (ref 6.0–8.5)

## 2014-01-28 LAB — LIPID PANEL
CHOL/HDL RATIO: 2.9 ratio (ref 0.0–4.4)
Cholesterol, Total: 180 mg/dL (ref 100–199)
HDL: 62 mg/dL (ref >39)
LDL Calculated: 80 mg/dL (ref 0–99)
Triglycerides: 189 mg/dL — ABNORMAL HIGH (ref 0–149)
VLDL CHOLESTEROL CAL: 38 mg/dL (ref 5–40)

## 2014-01-29 ENCOUNTER — Encounter: Payer: Self-pay | Admitting: Nurse Practitioner

## 2014-01-29 ENCOUNTER — Ambulatory Visit (INDEPENDENT_AMBULATORY_CARE_PROVIDER_SITE_OTHER): Payer: No Typology Code available for payment source | Admitting: Nurse Practitioner

## 2014-01-29 VITALS — BP 124/80 | HR 65 | Temp 98.9°F | Resp 10 | Ht 62.0 in | Wt 149.0 lb

## 2014-01-29 DIAGNOSIS — Z0000 Encounter for general adult medical examination without abnormal findings: Secondary | ICD-10-CM

## 2014-01-29 DIAGNOSIS — I10 Essential (primary) hypertension: Secondary | ICD-10-CM
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
## 2014-01-29 NOTE — Patient Instructions (Signed)
Follow up in 3 months with Dr Mariea Clonts, with Blood work prior to visit.  ASA 81 mg daily  Health Maintenance Adopting a healthy lifestyle and getting preventive care can go a long way to promote health and wellness. Talk with your health care provider about what schedule of regular examinations is right for you. This is a good chance for you to check in with your provider about disease prevention and staying healthy. In between checkups, there are plenty of things you can do on your own. Experts have done a lot of research about which lifestyle changes and preventive measures are most likely to keep you healthy. Ask your health care provider for more information. WEIGHT AND DIET  Eat a healthy diet  Be sure to include plenty of vegetables, fruits, low-fat dairy products, and lean protein.  Do not eat a lot of foods high in solid fats, added sugars, or salt.  Get regular exercise. This is one of the most important things you can do for your health.  Most adults should exercise for at least 150 minutes each week. The exercise should increase your heart rate and make you sweat (moderate-intensity exercise).  Most adults should also do strengthening exercises at least twice a week. This is in addition to the moderate-intensity exercise.  Maintain a healthy weight  Body mass index (BMI) is a measurement that can be used to identify possible weight problems. It estimates body fat based on height and weight. Your health care provider can help determine your BMI and help you achieve or maintain a healthy weight.  For females 4 years of age and older:   A BMI below 18.5 is considered underweight.  A BMI of 18.5 to 24.9 is normal.  A BMI of 25 to 29.9 is considered overweight.  A BMI of 30 and above is considered obese.  Watch levels of cholesterol and blood lipids  You should start having your blood tested for lipids and cholesterol at 57 years of age, then have this test every 5  years.  You may need to have your cholesterol levels checked more often if:  Your lipid or cholesterol levels are high.  You are older than 57 years of age.  You are at high risk for heart disease.  CANCER SCREENING   Lung Cancer  Lung cancer screening is recommended for adults 72-98 years old who are at high risk for lung cancer because of a history of smoking.  A yearly low-dose CT scan of the lungs is recommended for people who:  Currently smoke.  Have quit within the past 15 years.  Have at least a 30-pack-year history of smoking. A pack year is smoking an average of one pack of cigarettes a day for 1 year.  Yearly screening should continue until it has been 15 years since you quit.  Yearly screening should stop if you develop a health problem that would prevent you from having lung cancer treatment.  Breast Cancer  Practice breast self-awareness. This means understanding how your breasts normally appear and feel.  It also means doing regular breast self-exams. Let your health care provider know about any changes, no matter how small.  If you are in your 20s or 30s, you should have a clinical breast exam (CBE) by a health care provider every 1-3 years as part of a regular health exam.  If you are 71 or older, have a CBE every year. Also consider having a breast X-ray (mammogram) every year.  If you  have a family history of breast cancer, talk to your health care provider about genetic screening.  If you are at high risk for breast cancer, talk to your health care provider about having an MRI and a mammogram every year.  Breast cancer gene (BRCA) assessment is recommended for women who have family members with BRCA-related cancers. BRCA-related cancers include:  Breast.  Ovarian.  Tubal.  Peritoneal cancers.  Results of the assessment will determine the need for genetic counseling and BRCA1 and BRCA2 testing. Cervical Cancer Routine pelvic examinations to  screen for cervical cancer are no longer recommended for nonpregnant women who are considered low risk for cancer of the pelvic organs (ovaries, uterus, and vagina) and who do not have symptoms. A pelvic examination may be necessary if you have symptoms including those associated with pelvic infections. Ask your health care provider if a screening pelvic exam is right for you.   The Pap test is the screening test for cervical cancer for women who are considered at risk.  If you had a hysterectomy for a problem that was not cancer or a condition that could lead to cancer, then you no longer need Pap tests.  If you are older than 65 years, and you have had normal Pap tests for the past 10 years, you no longer need to have Pap tests.  If you have had past treatment for cervical cancer or a condition that could lead to cancer, you need Pap tests and screening for cancer for at least 20 years after your treatment.  If you no longer get a Pap test, assess your risk factors if they change (such as having a new sexual partner). This can affect whether you should start being screened again.  Some women have medical problems that increase their chance of getting cervical cancer. If this is the case for you, your health care provider may recommend more frequent screening and Pap tests.  The human papillomavirus (HPV) test is another test that may be used for cervical cancer screening. The HPV test looks for the virus that can cause cell changes in the cervix. The cells collected during the Pap test can be tested for HPV.  The HPV test can be used to screen women 66 years of age and older. Getting tested for HPV can extend the interval between normal Pap tests from three to five years.  An HPV test also should be used to screen women of any age who have unclear Pap test results.  After 57 years of age, women should have HPV testing as often as Pap tests.  Colorectal Cancer  This type of cancer can be  detected and often prevented.  Routine colorectal cancer screening usually begins at 57 years of age and continues through 57 years of age.  Your health care provider may recommend screening at an earlier age if you have risk factors for colon cancer.  Your health care provider may also recommend using home test kits to check for hidden blood in the stool.  A small camera at the end of a tube can be used to examine your colon directly (sigmoidoscopy or colonoscopy). This is done to check for the earliest forms of colorectal cancer.  Routine screening usually begins at age 25.  Direct examination of the colon should be repeated every 5-10 years through 57 years of age. However, you may need to be screened more often if early forms of precancerous polyps or small growths are found. Skin Cancer  Check  your skin from head to toe regularly.  Tell your health care provider about any new moles or changes in moles, especially if there is a change in a mole's shape or color.  Also tell your health care provider if you have a mole that is larger than the size of a pencil eraser.  Always use sunscreen. Apply sunscreen liberally and repeatedly throughout the day.  Protect yourself by wearing long sleeves, pants, a wide-brimmed hat, and sunglasses whenever you are outside. HEART DISEASE, DIABETES, AND HIGH BLOOD PRESSURE   Have your blood pressure checked at least every 1-2 years. High blood pressure causes heart disease and increases the risk of stroke.  If you are between 34 years and 60 years old, ask your health care provider if you should take aspirin to prevent strokes.  Have regular diabetes screenings. This involves taking a blood sample to check your fasting blood sugar level.  If you are at a normal weight and have a low risk for diabetes, have this test once every three years after 57 years of age.  If you are overweight and have a high risk for diabetes, consider being tested at a  younger age or more often. PREVENTING INFECTION  Hepatitis B  If you have a higher risk for hepatitis B, you should be screened for this virus. You are considered at high risk for hepatitis B if:  You were born in a country where hepatitis B is common. Ask your health care provider which countries are considered high risk.  Your parents were born in a high-risk country, and you have not been immunized against hepatitis B (hepatitis B vaccine).  You have HIV or AIDS.  You use needles to inject street drugs.  You live with someone who has hepatitis B.  You have had sex with someone who has hepatitis B.  You get hemodialysis treatment.  You take certain medicines for conditions, including cancer, organ transplantation, and autoimmune conditions. Hepatitis C  Blood testing is recommended for:  Everyone born from [birth year] through 1964.  Anyone with known risk factors for hepatitis C. Sexually transmitted infections (STIs)  You should be screened for sexually transmitted infections (STIs) including gonorrhea and chlamydia if:  You are sexually active and are younger than 57 years of age.  You are older than 57 years of age and your health care provider tells you that you are at risk for this type of infection.  Your sexual activity has changed since you were last screened and you are at an increased risk for chlamydia or gonorrhea. Ask your health care provider if you are at risk.  If you do not have HIV, but are at risk, it may be recommended that you take a prescription medicine daily to prevent HIV infection. This is called pre-exposure prophylaxis (PrEP). You are considered at risk if:  You are sexually active and do not regularly use condoms or know the HIV status of your partner(s).  You take drugs by injection.  You are sexually active with a partner who has HIV. Talk with your health care provider about whether you are at high risk of being infected with HIV. If you choose  to begin PrEP, you should first be tested for HIV. You should then be tested every 3 months for as long as you are taking PrEP.  PREGNANCY   If you are premenopausal and you may become pregnant, ask your health care provider about preconception counseling.  If you may become pregnant, take  400 to 800 micrograms (mcg) of folic acid every day.  If you want to prevent pregnancy, talk to your health care provider about birth control (contraception). OSTEOPOROSIS AND MENOPAUSE   Osteoporosis is a disease in which the bones lose minerals and strength with aging. This can result in serious bone fractures. Your risk for osteoporosis can be identified using a bone density scan.  If you are 23 years of age or older, or if you are at risk for osteoporosis and fractures, ask your health care provider if you should be screened.  Ask your health care provider whether you should take a calcium or vitamin D supplement to lower your risk for osteoporosis.  Menopause may have certain physical symptoms and risks.  Hormone replacement therapy may reduce some of these symptoms and risks. Talk to your health care provider about whether hormone replacement therapy is right for you.  HOME CARE INSTRUCTIONS   Schedule regular health, dental, and eye exams.  Stay current with your immunizations.   Do not use any tobacco products including cigarettes, chewing tobacco, or electronic cigarettes.  If you are pregnant, do not drink alcohol.  If you are breastfeeding, limit how much and how often you drink alcohol.  Limit alcohol intake to no more than 1 drink per day for nonpregnant women. One drink equals 12 ounces of beer, 5 ounces of wine, or 1 ounces of hard liquor.  Do not use street drugs.  Do not share needles.  Ask your health care provider for help if you need support or information about quitting drugs.  Tell your health care provider if you often feel depressed.  Tell your health care  provider if you have ever been abused or do not feel safe at home. Document Released: 10/24/2010 Document Revised: 08/25/2013 Document Reviewed: 03/12/2013 [facility] Patient Information 2015 [place], [place]. This information is not intended to replace advice given to you by your health care provider. Make sure you discuss any questions you have with your health care provider.

## 2014-02-23 ENCOUNTER — Encounter: Payer: Self-pay | Admitting: Nurse Practitioner

## 2014-02-26 ENCOUNTER — Other Ambulatory Visit: Payer: Self-pay | Admitting: Nurse Practitioner

## 2014-02-26 NOTE — Telephone Encounter (Signed)
Patient requested and faxed to pharmacy 

## 2014-03-23 ENCOUNTER — Other Ambulatory Visit: Payer: Self-pay | Admitting: Nurse Practitioner

## 2014-05-19 ENCOUNTER — Other Ambulatory Visit: Payer: No Typology Code available for payment source

## 2014-05-21 ENCOUNTER — Ambulatory Visit: Payer: No Typology Code available for payment source | Admitting: Internal Medicine

## 2014-05-28 ENCOUNTER — Other Ambulatory Visit: Payer: 59

## 2014-05-28 DIAGNOSIS — Z0000 Encounter for general adult medical examination without abnormal findings: Secondary | ICD-10-CM

## 2014-05-29 LAB — COMPREHENSIVE METABOLIC PANEL
A/G RATIO: 2.1 (ref 1.1–2.5)
ALBUMIN: 4.8 g/dL (ref 3.5–5.5)
ALT: 9 IU/L (ref 0–32)
AST: 10 IU/L (ref 0–40)
Alkaline Phosphatase: 62 IU/L (ref 39–117)
BUN / CREAT RATIO: 20 (ref 9–23)
BUN: 18 mg/dL (ref 6–24)
CO2: 22 mmol/L (ref 18–29)
Calcium: 9.8 mg/dL (ref 8.7–10.2)
Chloride: 100 mmol/L (ref 97–108)
Creatinine, Ser: 0.91 mg/dL (ref 0.57–1.00)
GFR, EST AFRICAN AMERICAN: 80 mL/min/{1.73_m2} (ref >59)
GFR, EST NON AFRICAN AMERICAN: 70 mL/min/{1.73_m2} (ref >59)
GLOBULIN, TOTAL: 2.3 g/dL (ref 1.5–4.5)
GLUCOSE: 95 mg/dL (ref 65–99)
POTASSIUM: 4.5 mmol/L (ref 3.5–5.2)
SODIUM: 140 mmol/L (ref 134–144)
Total Bilirubin: 0.5 mg/dL (ref 0.0–1.2)
Total Protein: 7.1 g/dL (ref 6.0–8.5)

## 2014-06-04 ENCOUNTER — Ambulatory Visit (INDEPENDENT_AMBULATORY_CARE_PROVIDER_SITE_OTHER): Payer: 59 | Admitting: Internal Medicine

## 2014-06-04 ENCOUNTER — Encounter: Payer: Self-pay | Admitting: Internal Medicine

## 2014-06-04 ENCOUNTER — Telehealth: Payer: Self-pay | Admitting: Internal Medicine

## 2014-06-04 VITALS — BP 130/80 | HR 86 | Temp 97.7°F | Resp 18 | Ht 62.0 in | Wt 147.0 lb

## 2014-06-04 DIAGNOSIS — M545 Low back pain, unspecified: Secondary | ICD-10-CM

## 2014-06-04 DIAGNOSIS — R232 Flushing: Secondary | ICD-10-CM

## 2014-06-04 DIAGNOSIS — G8929 Other chronic pain: Secondary | ICD-10-CM

## 2014-06-04 DIAGNOSIS — N951 Menopausal and female climacteric states: Secondary | ICD-10-CM

## 2014-06-04 DIAGNOSIS — E785 Hyperlipidemia, unspecified: Secondary | ICD-10-CM

## 2014-06-04 DIAGNOSIS — F319 Bipolar disorder, unspecified: Secondary | ICD-10-CM

## 2014-06-04 DIAGNOSIS — I10 Essential (primary) hypertension: Secondary | ICD-10-CM

## 2014-06-04 DIAGNOSIS — R159 Full incontinence of feces: Secondary | ICD-10-CM

## 2014-06-04 MED ORDER — GABAPENTIN 400 MG PO CAPS: 400.0000 mg | ORAL_CAPSULE | Freq: Two times a day (BID) | ORAL | Status: DC
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
## 2014-06-04 NOTE — Telephone Encounter (Signed)
Anita Diaz was in the office for an acute visit this morning and was scheduled with Dr. Renato Gails. Anita Diaz has Requested to switch her care to Dr. Renato Gails from [facility] Anita Diaz was informed that Dr. Renato Gails stays booked more than Dr. Renato Gails and it may be a little harder to get in with Dr. Renato Gails than Shanda Bumps. Anita Diaz understood

## 2014-06-04 NOTE — Patient Instructions (Signed)
Try taking metamucil daily.  Be sure to drink plenty of water also.   The fiber supplement will help bulk your stools.

## 2014-08-27 ENCOUNTER — Other Ambulatory Visit: Payer: Self-pay | Admitting: Nurse Practitioner

## 2014-09-07 ENCOUNTER — Other Ambulatory Visit: Payer: Self-pay | Admitting: Internal Medicine

## 2014-09-08 ENCOUNTER — Other Ambulatory Visit: Payer: Self-pay | Admitting: *Deleted

## 2014-09-08 DIAGNOSIS — G8929 Other chronic pain: Secondary | ICD-10-CM

## 2014-09-08 DIAGNOSIS — M545 Low back pain, unspecified: Secondary | ICD-10-CM

## 2014-09-08 DIAGNOSIS — R232 Flushing: Secondary | ICD-10-CM

## 2014-09-08 MED ORDER — GABAPENTIN 400 MG PO CAPS: 400.0000 mg | ORAL_CAPSULE | Freq: Two times a day (BID) | ORAL | Status: DC

## 2014-09-08 NOTE — Telephone Encounter (Signed)
[facility] 

## 2014-09-24 ENCOUNTER — Other Ambulatory Visit: Payer: Self-pay | Admitting: Nurse Practitioner

## 2014-09-24 NOTE — Telephone Encounter (Signed)
Patient requested and faxed to pharmacy 

## 2014-10-15 ENCOUNTER — Ambulatory Visit: Payer: 59 | Admitting: Internal Medicine

## 2014-11-13 ENCOUNTER — Other Ambulatory Visit: Payer: Self-pay | Admitting: Nurse Practitioner

## 2014-11-13 NOTE — Telephone Encounter (Signed)
Patient called and requested to be faxed to pharmacy 

## 2015-03-01 ENCOUNTER — Other Ambulatory Visit: Payer: Self-pay | Admitting: *Deleted

## 2015-03-01 ENCOUNTER — Other Ambulatory Visit: Payer: Self-pay | Admitting: Internal Medicine

## 2015-03-12 ENCOUNTER — Other Ambulatory Visit: Payer: Self-pay | Admitting: Internal Medicine

## 2015-09-27 ENCOUNTER — Encounter: Payer: Self-pay | Admitting: *Deleted

## 2015-09-27 ENCOUNTER — Telehealth: Payer: Self-pay | Admitting: *Deleted

## 2015-09-27 NOTE — Telephone Encounter (Signed)
Patient husband, Vonna Kotyk called and stated that patient needs a letter to be excused from [place] duty due to wife having to care for him 24/7. Needs letter before Thursday he stated. They dropped of Jury Duty summons and placed in Dr. Hebert Soho folder along with this message. Juror Number: [id number], Panel Number: [id number] [place] Duty Summon date is July 6

## 2015-09-27 NOTE — Telephone Encounter (Signed)
Per Dr. Reed---Ok to do. Patient's husband must keep his appointment 6/15. Patient notified and letter left up front with summons for pick up.

## 2016-08-19 ENCOUNTER — Other Ambulatory Visit: Payer: Self-pay | Admitting: Internal Medicine

## 2016-08-19 DIAGNOSIS — G8929 Other chronic pain: Secondary | ICD-10-CM

## 2016-08-19 DIAGNOSIS — M545 Low back pain, unspecified: Secondary | ICD-10-CM

## 2016-08-19 DIAGNOSIS — R232 Flushing: Secondary | ICD-10-CM

## 2017-04-09 ENCOUNTER — Encounter (HOSPITAL_COMMUNITY): Payer: Self-pay

## 2017-06-03 ENCOUNTER — Emergency Department (HOSPITAL_COMMUNITY): Payer: BLUE CROSS/BLUE SHIELD

## 2017-06-03 ENCOUNTER — Emergency Department (HOSPITAL_COMMUNITY)
Admission: EM | Admit: 2017-06-03 | Discharge: 2017-06-03 | Disposition: A | Discharge status: Home / Self Care | Payer: BLUE CROSS/BLUE SHIELD | Attending: Physician Assistant | Admitting: Physician Assistant

## 2017-06-03 ENCOUNTER — Encounter (HOSPITAL_COMMUNITY): Payer: Self-pay | Admitting: Emergency Medicine

## 2017-06-03 DIAGNOSIS — Z87891 Personal history of nicotine dependence: Secondary | ICD-10-CM | POA: Insufficient documentation

## 2017-06-03 DIAGNOSIS — I447 Left bundle-branch block, unspecified: Secondary | ICD-10-CM | POA: Insufficient documentation

## 2017-06-03 DIAGNOSIS — Z79899 Other long term (current) drug therapy: Secondary | ICD-10-CM | POA: Diagnosis not present

## 2017-06-03 DIAGNOSIS — I10 Essential (primary) hypertension: Secondary | ICD-10-CM | POA: Diagnosis not present

## 2017-06-03 DIAGNOSIS — R51 Headache: Secondary | ICD-10-CM | POA: Diagnosis not present

## 2017-06-03 DIAGNOSIS — R0602 Shortness of breath: Secondary | ICD-10-CM | POA: Diagnosis not present

## 2017-06-03 DIAGNOSIS — R079 Chest pain, unspecified: Secondary | ICD-10-CM | POA: Diagnosis not present

## 2017-06-03 DIAGNOSIS — R0789 Other chest pain: Secondary | ICD-10-CM | POA: Diagnosis not present

## 2017-06-03 LAB — CBC
HEMATOCRIT: 36.6 % (ref 36.0–46.0)
Hemoglobin: 12 g/dL (ref 12.0–15.0)
MCH: 28.6 pg (ref 26.0–34.0)
MCHC: 32.8 g/dL (ref 30.0–36.0)
MCV: 87.1 fL (ref 78.0–100.0)
Platelets: 334 10*3/uL (ref 150–400)
RBC: 4.2 MIL/uL (ref 3.87–5.11)
RDW: 13.1 % (ref 11.5–15.5)
WBC: 10.5 10*3/uL (ref 4.0–10.5)

## 2017-06-03 LAB — BASIC METABOLIC PANEL
Anion gap: 11 (ref 5–15)
BUN: 17 mg/dL (ref 6–20)
CO2: 24 mmol/L (ref 22–32)
Calcium: 8.8 mg/dL — ABNORMAL LOW (ref 8.9–10.3)
Chloride: 104 mmol/L (ref 101–111)
Creatinine, Ser: 0.75 mg/dL (ref 0.44–1.00)
GFR calc Af Amer: >60 mL/min (ref >60)
GLUCOSE: 99 mg/dL (ref 65–99)
POTASSIUM: 4.1 mmol/L (ref 3.5–5.1)
Sodium: 139 mmol/L (ref 135–145)

## 2017-06-03 LAB — I-STAT TROPONIN, ED: Troponin i, poc: 0.06 ng/mL (ref 0.00–0.08)

## 2017-06-03 IMAGING — CR DG CHEST 2V
2 series · 2 of 2 positions shown · non-contrast
Comparison: [DATE]

CLINICAL DATA: Chest pain, shortness of Breath

EXAM:
CHEST  2 VIEW

[chest lat]
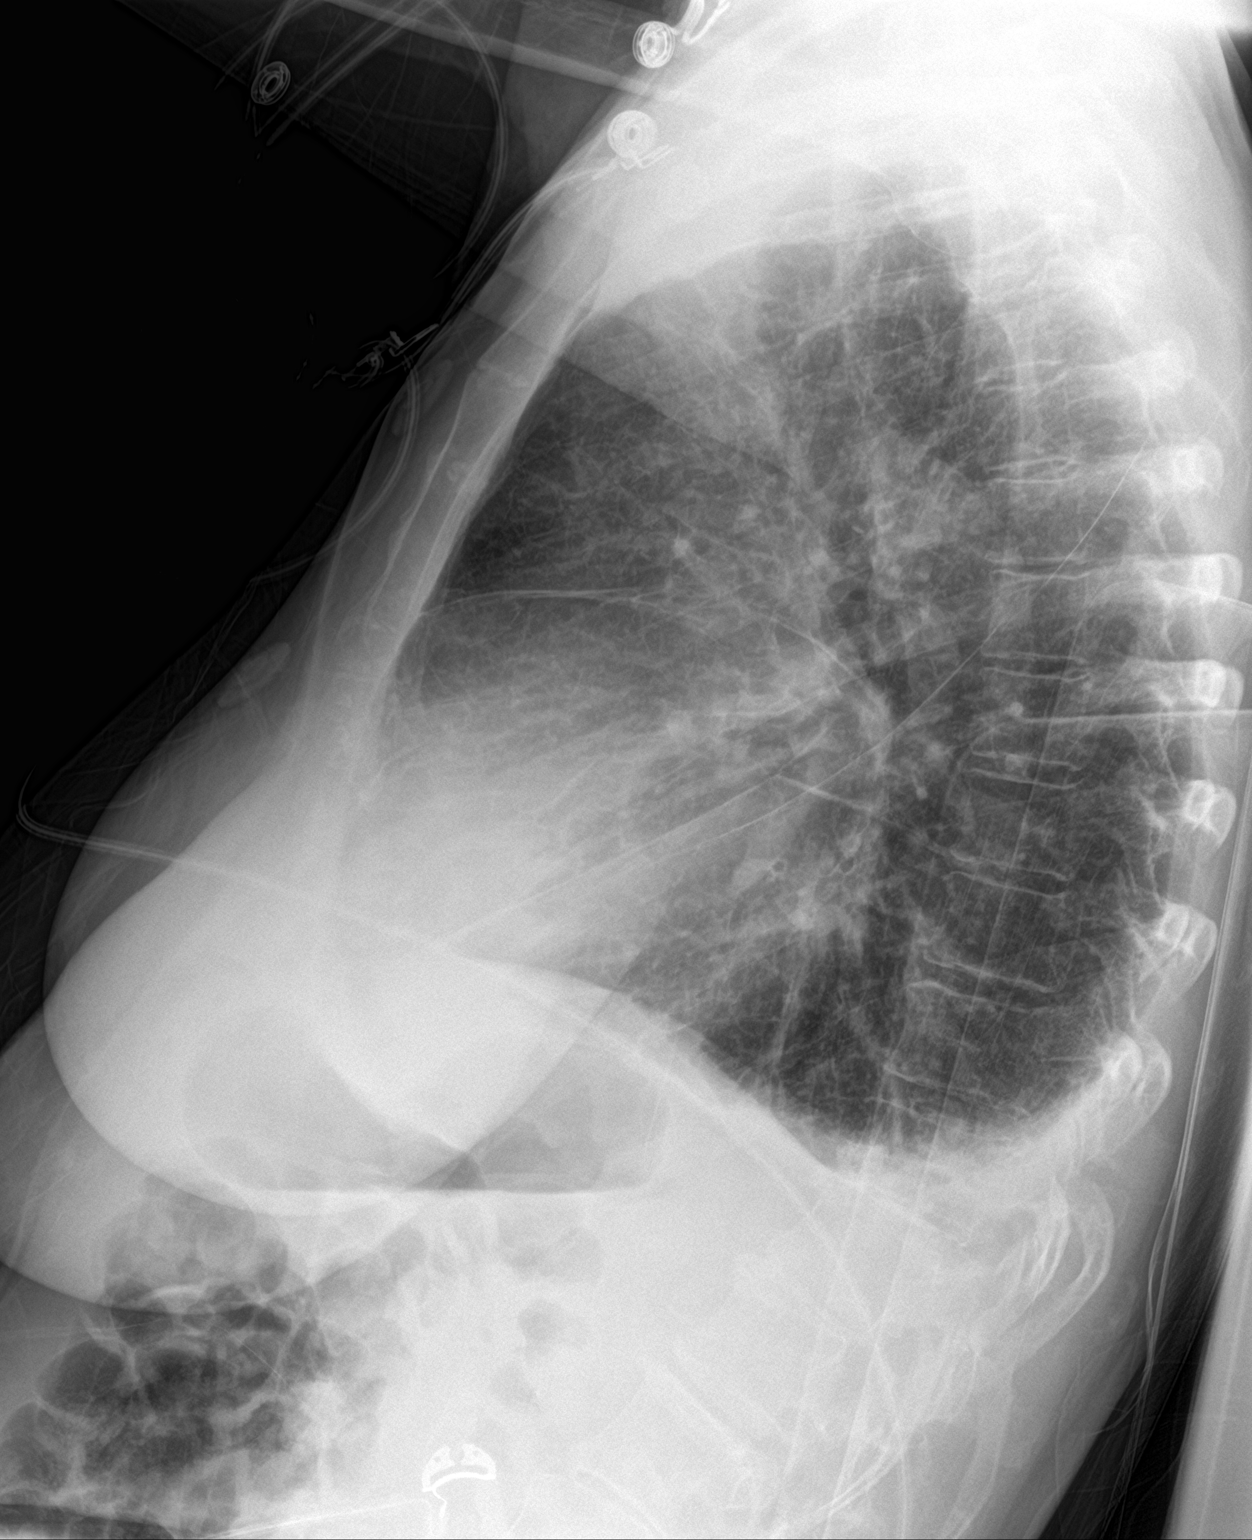

[chest ap]
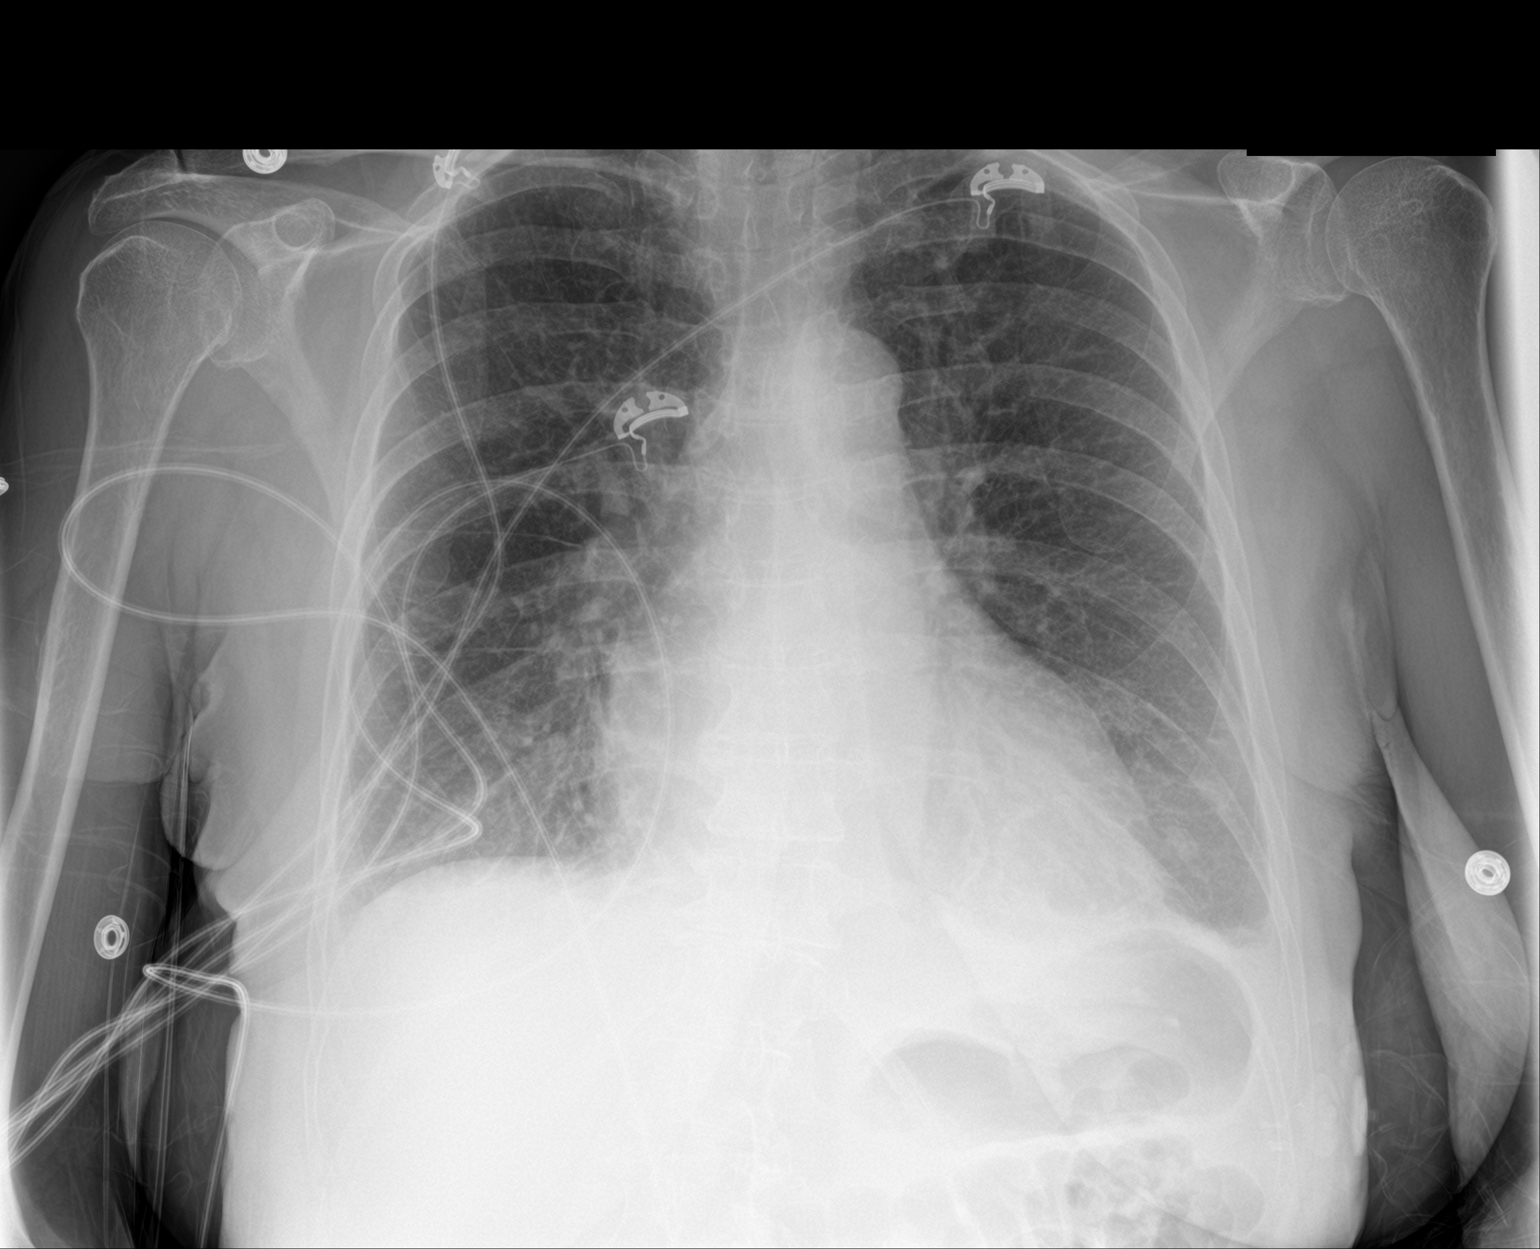

[2 of 2 positions shown; findings below may reference images not displayed]

FINDINGS: Borderline cardiomegaly. Small bilateral pleural effusions.
Bibasilar atelectasis. No acute bony abnormality or overt edema.
IMPRESSION: Small bilateral effusions with bibasilar atelectasis.

## 2017-06-03 NOTE — ED Notes (Signed)
ED Provider at bedside. 

## 2017-06-03 NOTE — Discharge Instructions (Signed)
We want you to follow-up within primary care as soon as possible.  We have given you a list of primary care below.  We also think you should follow-up with a cardiologist potentially for a cardiac echo given your left bundle branch seen on EKG.  Please bring this information to primary care for follow-up  To find a primary care or specialty doctor please call [phone] or [phone] to access "[facility] Find a Doctor Service."  You may also go on the [facility] website at [email or web address]  There are also multiple [facility] practices throughout the [place] that are frequently accepting new patients. You may find a clinic that is close to your home and contact them.  [facility] -  [address] [place] [address] [phone]  [facility] in [place] (also locations in [place] and [place]) -  [address] [place] [address] [phone]  [facility] -  [address] [place] [address] [phone]

## 2017-06-03 NOTE — ED Triage Notes (Signed)
Pt to ER for evaluation of central chest pressure with associated shortness of breath, denies radiation. States began 3 weeks ago and has progressively gotten worse. Sent from [facility] due to abnormal EKG. Pt in NAD. A/o x4.

## 2017-06-03 NOTE — ED Notes (Signed)
Pt returned from X-ray.  
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
## 2017-06-13 ENCOUNTER — Ambulatory Visit: Payer: BLUE CROSS/BLUE SHIELD | Admitting: Family Medicine

## 2017-06-13 ENCOUNTER — Encounter: Payer: Self-pay | Admitting: Family Medicine

## 2017-06-13 VITALS — BP 128/70 | HR 76 | Temp 98.2°F | Ht 62.0 in | Wt 123.4 lb

## 2017-06-13 DIAGNOSIS — R197 Diarrhea, unspecified: Secondary | ICD-10-CM | POA: Insufficient documentation

## 2017-06-13 DIAGNOSIS — F319 Bipolar disorder, unspecified: Secondary | ICD-10-CM

## 2017-06-13 DIAGNOSIS — R9431 Abnormal electrocardiogram [ECG] [EKG]: Secondary | ICD-10-CM

## 2017-06-13 NOTE — Assessment & Plan Note (Signed)
Continue Imodium as needed.  Recommended increased dietary fiber.  She will need her colonoscopy soon.  She will return soon to discuss this and set this up.

## 2017-06-13 NOTE — Patient Instructions (Addendum)
I would like for you to see cardiology soon. I will place this referral.  Come back to see me soon to discuss your healthcare maintenance issues such as colonoscopy, mammogram, pap smear, etc.  Take care, Dr [name]
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
## 2017-06-13 NOTE — Assessment & Plan Note (Signed)
No manic symptoms today.  We will not add any medications to her regimen at this time.  If symptoms become problematic, would likely need referral to psychiatry.  We can discuss this further next visit.

## 2017-06-20 ENCOUNTER — Ambulatory Visit: Payer: BLUE CROSS/BLUE SHIELD | Admitting: Nurse Practitioner

## 2017-06-20 ENCOUNTER — Encounter: Payer: Self-pay | Admitting: Nurse Practitioner

## 2017-06-20 VITALS — BP 160/74 | HR 69 | Ht 62.0 in | Wt 123.8 lb

## 2017-06-20 DIAGNOSIS — I10 Essential (primary) hypertension: Secondary | ICD-10-CM

## 2017-06-20 DIAGNOSIS — R079 Chest pain, unspecified: Secondary | ICD-10-CM | POA: Diagnosis not present

## 2017-06-20 MED ORDER — LOSARTAN POTASSIUM 25 MG PO TABS: 25.0000 mg | ORAL_TABLET | Freq: Every day | ORAL | 3 refills | Status: DC

## 2017-06-20 MED ORDER — ASPIRIN EC 81 MG PO TBEC: 81.0000 mg | DELAYED_RELEASE_TABLET | Freq: Every day | ORAL | 3 refills | Status: DC

## 2017-06-20 MED ORDER — NITROGLYCERIN 0.4 MG SL SUBL: 0.4000 mg | SUBLINGUAL_TABLET | SUBLINGUAL | 3 refills | Status: DC | PRN
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
## 2017-06-20 NOTE — Patient Instructions (Addendum)
We will be checking the following labs today - BMET, CBC, HPF, Lipids, PT and PTT   Medication Instructions:    We are starting baby aspirin daily - samples given - take one daily  We are starting Losartan 25 mg to take one each day I have sent a RX in for NTG - Use your NTG under your tongue for recurrent chest pain. May take one tablet every 5 minutes. If you are still having discomfort after 3 tablets in 15 minutes, call 911.     Testing/Procedures To Be Arranged:  Echocardiogram  Cardiac catheterization     Other Special Instructions:   Your provider has recommended a cardiac catherization  You are scheduled for a cardiac catheterization on Friday, March 8th at 1:30PM with Dr. Excell Seltzer or associate.  Please arrive at the [facility] (Main Entrance) at [facility] at [address] [place] -  2nd Floor Short Stay on Friday, March 8th at 11:30AM.    Special note: Every effort is made to have your procedure done on time.   Please understand that emergencies sometimes delay a scheduled   procedure.  No food or drink after midnight on Thursday, March 7th. On the morning of your procedure, take all your medicines.   You may take your morning medications with a sip of water on the day of your procedure.  Please take a baby aspirin (81 mg) on the morning of your procedure.   Medications to HOLD - NONE  Plan for a one night stay -- bring personal belongings.  Bring a current list of your medications and current insurance cards.  You MUST have a responsible person to drive you home. Someone MUST be with you the first 24 hours after you arrive home or your discharge will be delayed. Wear clothes that are easy to get on and off and wear slip on shoes.    Coronary Angiogram A coronary angiogram, also called coronary angiography, is an X-ray procedure used to look at the arteries in the heart. In this procedure, a dye (contrast dye) is injected through a long, hollow  tube (catheter). The catheter is about the size of a piece of cooked spaghetti and is inserted through your groin, wrist, or arm. The dye is injected into each artery, and X-rays are then taken to show if there is a blockage in the arteries of your heart.  LET [facility] CARE PROVIDER KNOW ABOUT: Any allergies you have, including allergies to shellfish or contrast dye.  All medicines you are taking, including vitamins, herbs, eye drops, creams, and over-the-counter medicines.  Previous problems you or members of your family have had with the use of anesthetics.  Any blood disorders you have.  Previous surgeries you have had. History of kidney problems or failure.  Other medical conditions you have.  RISKS AND COMPLICATIONS  Generally, a coronary angiogram is a safe procedure. However, about 1 person out of 1000 can have problems that may include: Allergic reaction to the dye. Bleeding/bruising from the access site or other locations. Kidney injury, especially in people with impaired kidney function. Stroke (rare). Heart attack (rare). Irregular rhythms (rare) Death (rare)  BEFORE THE PROCEDURE  Do not eat or drink anything after midnight the night before the procedure or as directed by your health care provider.  Ask your health care provider about changing or stopping your regular medicines. This is especially important if you are taking diabetes medicines or blood thinners.  PROCEDURE You may be  given a medicine to help you relax (sedative) before the procedure. This medicine is given through an intravenous (IV) access tube that is inserted into one of your veins.  The area where the catheter will be inserted will be washed and shaved. This is usually done in the groin but may be done in the fold of your arm (near your elbow) or in the wrist.  A medicine will be given to numb the area where the catheter will be inserted (local anesthetic).  The health care provider will  insert the catheter into an artery. The catheter will be guided by using a special type of X-ray (fluoroscopy) of the blood vessel being examined.  A special dye will then be injected into the catheter, and X-rays will be taken. The dye will help to show where any narrowing or blockages are located in the heart arteries.    AFTER THE PROCEDURE  If the procedure is done through the leg, you will be kept in bed lying flat for several hours. You will be instructed to not bend or cross your legs. The insertion site will be checked frequently.  The pulse in your feet or wrist will be checked frequently.  Additional blood tests, X-rays, and an electrocardiogram may be done.      If you need a refill on your cardiac medications before your next appointment, please call your pharmacy.   Call the [facility] office at [phone] if you have any questions, problems or concerns.

## 2017-06-21 LAB — HEPATIC FUNCTION PANEL
ALT: 15 IU/L (ref 0–32)
AST: 14 IU/L (ref 0–40)
Albumin: 4.6 g/dL (ref 3.6–4.8)
Alkaline Phosphatase: 67 IU/L (ref 39–117)
Bilirubin Total: 0.6 mg/dL (ref 0.0–1.2)
Bilirubin, Direct: 0.17 mg/dL (ref 0.00–0.40)
Total Protein: 7.4 g/dL (ref 6.0–8.5)

## 2017-06-21 LAB — CBC
Hematocrit: 39.4 % (ref 34.0–46.6)
Hemoglobin: 12.9 g/dL (ref 11.1–15.9)
MCH: 27.9 pg (ref 26.6–33.0)
MCHC: 32.7 g/dL (ref 31.5–35.7)
MCV: 85 fL (ref 79–97)
Platelets: 373 10*3/uL (ref 150–379)
RBC: 4.63 x10E6/uL (ref 3.77–5.28)
RDW: 13.5 % (ref 12.3–15.4)
WBC: 9.2 10*3/uL (ref 3.4–10.8)

## 2017-06-21 LAB — PT AND PTT
INR: 1 (ref 0.8–1.2)
Prothrombin Time: 10.4 s (ref 9.1–12.0)
aPTT: 28 s (ref 24–33)

## 2017-06-21 LAB — BASIC METABOLIC PANEL
BUN/Creatinine Ratio: 23 (ref 12–28)
BUN: 18 mg/dL (ref 8–27)
CO2: 23 mmol/L (ref 20–29)
Calcium: 9.8 mg/dL (ref 8.7–10.3)
Chloride: 103 mmol/L (ref 96–106)
Creatinine, Ser: 0.8 mg/dL (ref 0.57–1.00)
GFR calc Af Amer: 92 mL/min/{1.73_m2} (ref >59)
GFR calc non Af Amer: 80 mL/min/{1.73_m2} (ref >59)
Glucose: 87 mg/dL (ref 65–99)
Potassium: 4.6 mmol/L (ref 3.5–5.2)
Sodium: 141 mmol/L (ref 134–144)

## 2017-06-21 LAB — LIPID PANEL
Chol/HDL Ratio: 2.8 ratio (ref 0.0–4.4)
Cholesterol, Total: 195 mg/dL (ref 100–199)
HDL: 70 mg/dL (ref >39)
LDL Calculated: 98 mg/dL (ref 0–99)
Triglycerides: 133 mg/dL (ref 0–149)
VLDL Cholesterol Cal: 27 mg/dL (ref 5–40)

## 2017-06-28 ENCOUNTER — Telehealth: Payer: Self-pay | Admitting: *Deleted

## 2017-06-28 ENCOUNTER — Other Ambulatory Visit: Payer: Self-pay

## 2017-06-28 ENCOUNTER — Ambulatory Visit (HOSPITAL_COMMUNITY): Payer: BLUE CROSS/BLUE SHIELD | Attending: Cardiology

## 2017-06-28 DIAGNOSIS — R079 Chest pain, unspecified: Secondary | ICD-10-CM

## 2017-06-28 DIAGNOSIS — Z87891 Personal history of nicotine dependence: Secondary | ICD-10-CM | POA: Diagnosis not present

## 2017-06-28 DIAGNOSIS — E785 Hyperlipidemia, unspecified: Secondary | ICD-10-CM | POA: Diagnosis not present

## 2017-06-28 DIAGNOSIS — Z8249 Family history of ischemic heart disease and other diseases of the circulatory system: Secondary | ICD-10-CM | POA: Diagnosis not present

## 2017-06-28 DIAGNOSIS — I447 Left bundle-branch block, unspecified: Secondary | ICD-10-CM | POA: Diagnosis not present

## 2017-06-28 DIAGNOSIS — I083 Combined rheumatic disorders of mitral, aortic and tricuspid valves: Secondary | ICD-10-CM | POA: Diagnosis not present

## 2017-06-28 DIAGNOSIS — I10 Essential (primary) hypertension: Secondary | ICD-10-CM

## 2017-06-28 NOTE — Telephone Encounter (Signed)
Pt contacted pre-catheterization scheduled at [facility] for: Friday June 29, 2017 1:30 PM Verified arrival time and place: [facility] Main Entrance A/North Tower at: 11:30 AM Do not eat or drink after midnight prior to cath. Verified allergies in Epic.  AM meds can be  taken pre-cath with sip of water including: ASA 81 mg   Confirmed patient has responsible person to drive home post procedure and observe patient for 24 hours: yes

## 2017-06-29 ENCOUNTER — Encounter (HOSPITAL_COMMUNITY)
Admission: RE | Disposition: A | Discharge status: Home / Self Care | Payer: Self-pay | Source: Ambulatory Visit | Attending: Cardiovascular Disease

## 2017-06-29 ENCOUNTER — Ambulatory Visit (HOSPITAL_COMMUNITY)
Admission: RE | Admit: 2017-06-29 | Discharge: 2017-06-29 | Disposition: A | Discharge status: Home / Self Care | Payer: BLUE CROSS/BLUE SHIELD | Source: Ambulatory Visit | Attending: Cardiovascular Disease | Admitting: Cardiovascular Disease

## 2017-06-29 DIAGNOSIS — Z882 Allergy status to sulfonamides status: Secondary | ICD-10-CM | POA: Insufficient documentation

## 2017-06-29 DIAGNOSIS — R079 Chest pain, unspecified: Secondary | ICD-10-CM | POA: Diagnosis not present

## 2017-06-29 DIAGNOSIS — R9431 Abnormal electrocardiogram [ECG] [EKG]: Secondary | ICD-10-CM | POA: Diagnosis not present

## 2017-06-29 DIAGNOSIS — Z87891 Personal history of nicotine dependence: Secondary | ICD-10-CM | POA: Insufficient documentation

## 2017-06-29 DIAGNOSIS — I447 Left bundle-branch block, unspecified: Secondary | ICD-10-CM | POA: Insufficient documentation

## 2017-06-29 DIAGNOSIS — Z8249 Family history of ischemic heart disease and other diseases of the circulatory system: Secondary | ICD-10-CM | POA: Diagnosis not present

## 2017-06-29 DIAGNOSIS — I428 Other cardiomyopathies: Secondary | ICD-10-CM | POA: Insufficient documentation

## 2017-06-29 DIAGNOSIS — I10 Essential (primary) hypertension: Secondary | ICD-10-CM | POA: Diagnosis not present

## 2017-06-29 DIAGNOSIS — F319 Bipolar disorder, unspecified: Secondary | ICD-10-CM | POA: Insufficient documentation

## 2017-06-29 DIAGNOSIS — R0789 Other chest pain: Secondary | ICD-10-CM | POA: Diagnosis not present

## 2017-06-29 DIAGNOSIS — E785 Hyperlipidemia, unspecified: Secondary | ICD-10-CM | POA: Diagnosis not present

## 2017-06-29 DIAGNOSIS — G4700 Insomnia, unspecified: Secondary | ICD-10-CM | POA: Diagnosis not present

## 2017-06-29 DIAGNOSIS — G8929 Other chronic pain: Secondary | ICD-10-CM | POA: Insufficient documentation

## 2017-06-29 HISTORY — DX: Chest pain, unspecified: R07.9

## 2017-06-29 HISTORY — PX: LEFT HEART CATH AND CORONARY ANGIOGRAPHY: CATH118249

## 2017-06-29 SURGERY — LEFT HEART CATH AND CORONARY ANGIOGRAPHY
Anesthesia: LOCAL

## 2017-06-29 MED ORDER — SODIUM CHLORIDE 0.9 % IV SOLN: 250.0000 mL | INTRAVENOUS | Status: DC | PRN

## 2017-06-29 MED ORDER — SODIUM CHLORIDE 0.9 % WEIGHT BASED INFUSION: 1.0000 mL/kg/h | INTRAVENOUS | Status: DC

## 2017-06-29 MED ORDER — SODIUM CHLORIDE 0.9 % IV SOLN: INTRAVENOUS | Status: DC

## 2017-06-29 MED ORDER — IOPAMIDOL (ISOVUE-370) INJECTION 76%
INTRAVENOUS | Status: AC
  Filled 2017-06-29: qty 100

## 2017-06-29 MED ORDER — DIAZEPAM 5 MG PO TABS
10.0000 mg | ORAL_TABLET | ORAL | Status: AC
  Administered 2017-06-29: 10 mg via ORAL

## 2017-06-29 MED ORDER — HEPARIN SODIUM (PORCINE) 1000 UNIT/ML IJ SOLN
INTRAMUSCULAR | Status: AC
  Filled 2017-06-29: qty 1

## 2017-06-29 MED ORDER — LIDOCAINE HCL (PF) 1 % IJ SOLN
INTRAMUSCULAR | Status: AC
  Filled 2017-06-29: qty 30

## 2017-06-29 MED ORDER — ASPIRIN 81 MG PO CHEW: 81.0000 mg | CHEWABLE_TABLET | ORAL | Status: DC

## 2017-06-29 MED ORDER — CARVEDILOL 3.125 MG PO TABS: 3.1250 mg | ORAL_TABLET | Freq: Two times a day (BID) | ORAL | Status: DC

## 2017-06-29 MED ORDER — DIAZEPAM 5 MG PO TABS
ORAL_TABLET | ORAL | Status: AC
  Filled 2017-06-29: qty 2

## 2017-06-29 MED ORDER — NITROGLYCERIN 0.4 MG SL SUBL
SUBLINGUAL_TABLET | SUBLINGUAL | Status: AC
  Administered 2017-06-29: 0.4 mg via SUBLINGUAL
  Filled 2017-06-29: qty 1

## 2017-06-29 MED ORDER — LIDOCAINE HCL (PF) 1 % IJ SOLN
INTRAMUSCULAR | Status: DC | PRN
  Administered 2017-06-29: 2 mL

## 2017-06-29 MED ORDER — IOPAMIDOL (ISOVUE-370) INJECTION 76%
INTRAVENOUS | Status: DC | PRN
  Administered 2017-06-29: 60 mL via INTRA_ARTERIAL

## 2017-06-29 MED ORDER — MIDAZOLAM HCL 2 MG/2ML IJ SOLN
INTRAMUSCULAR | Status: AC
  Filled 2017-06-29: qty 2

## 2017-06-29 MED ORDER — NITROGLYCERIN 0.4 MG SL SUBL
0.4000 mg | SUBLINGUAL_TABLET | SUBLINGUAL | Status: DC | PRN
  Administered 2017-06-29 (×2): 0.4 mg via SUBLINGUAL

## 2017-06-29 MED ORDER — VERAPAMIL HCL 2.5 MG/ML IV SOLN
INTRAVENOUS | Status: AC
  Filled 2017-06-29: qty 2

## 2017-06-29 MED ORDER — FENTANYL CITRATE (PF) 100 MCG/2ML IJ SOLN
INTRAMUSCULAR | Status: DC | PRN
  Administered 2017-06-29: 25 ug via INTRAVENOUS

## 2017-06-29 MED ORDER — HEPARIN (PORCINE) IN NACL 2-0.9 UNIT/ML-% IJ SOLN
INTRAMUSCULAR | Status: AC
  Filled 2017-06-29: qty 1000

## 2017-06-29 MED ORDER — FENTANYL CITRATE (PF) 100 MCG/2ML IJ SOLN
INTRAMUSCULAR | Status: AC
  Filled 2017-06-29: qty 2

## 2017-06-29 MED ORDER — MIDAZOLAM HCL 2 MG/2ML IJ SOLN
INTRAMUSCULAR | Status: DC | PRN
  Administered 2017-06-29: 2 mg via INTRAVENOUS

## 2017-06-29 MED ORDER — CARVEDILOL 3.125 MG PO TABS: 3.1250 mg | ORAL_TABLET | Freq: Two times a day (BID) | ORAL | 5 refills | Status: DC

## 2017-06-29 MED ORDER — VERAPAMIL HCL 2.5 MG/ML IV SOLN
INTRAVENOUS | Status: DC | PRN
  Administered 2017-06-29: 10 mL via INTRA_ARTERIAL

## 2017-06-29 MED ORDER — SODIUM CHLORIDE 0.9% FLUSH: 3.0000 mL | Freq: Two times a day (BID) | INTRAVENOUS | Status: DC

## 2017-06-29 MED ORDER — HEPARIN SODIUM (PORCINE) 1000 UNIT/ML IJ SOLN
INTRAMUSCULAR | Status: DC | PRN
  Administered 2017-06-29: 4000 [IU] via INTRAVENOUS

## 2017-06-29 MED ORDER — SODIUM CHLORIDE 0.9% FLUSH: 3.0000 mL | INTRAVENOUS | Status: DC | PRN

## 2017-06-29 MED ORDER — HEPARIN (PORCINE) IN NACL 2-0.9 UNIT/ML-% IJ SOLN
INTRAMUSCULAR | Status: AC | PRN
  Administered 2017-06-29 (×2): 500 mL

## 2017-06-29 SURGICAL SUPPLY — 10 items
CATH IMPULSE 5F ANG/FL3.5 (CATHETERS) ×1 IMPLANT
DEVICE RAD COMP TR BAND LRG (VASCULAR PRODUCTS) ×1 IMPLANT
GLIDESHEATH SLEND SS 6F .021 (SHEATH) ×1 IMPLANT
GUIDEWIRE INQWIRE 1.5J.035X260 (WIRE) IMPLANT
INQWIRE 1.5J .035X260CM (WIRE) ×2
KIT HEART LEFT (KITS) ×2 IMPLANT
PACK CARDIAC CATHETERIZATION (CUSTOM PROCEDURE TRAY) ×2 IMPLANT
SYR MEDRAD MARK V 150ML (SYRINGE) ×2 IMPLANT
TRANSDUCER W/STOPCOCK (MISCELLANEOUS) ×2 IMPLANT
TUBING CIL FLEX 10 FLL-RA (TUBING) ×2 IMPLANT

## 2017-06-29 NOTE — Interval H&P Note (Signed)
History and Physical Interval Note:  06/29/2017 1:29 PM  Anita Diaz  has presented today for surgery, with the diagnosis of cp  The various methods of treatment have been discussed with the patient and family. After consideration of risks, benefits and other options for treatment, the patient has consented to  Procedure(s): LEFT HEART CATH AND CORONARY ANGIOGRAPHY (N/A) as a surgical intervention .  The patient's history has been reviewed, patient examined, no change in status, stable for surgery.  I have reviewed the patient's chart and labs.  Questions were answered to the patient's satisfaction.     [name]

## 2017-06-29 NOTE — Discharge Instructions (Signed)

## 2017-07-02 ENCOUNTER — Encounter (HOSPITAL_COMMUNITY): Payer: Self-pay | Admitting: Cardiovascular Disease

## 2017-07-03 ENCOUNTER — Ambulatory Visit: Payer: BLUE CROSS/BLUE SHIELD | Admitting: Nurse Practitioner

## 2017-07-03 ENCOUNTER — Encounter: Payer: Self-pay | Admitting: Nurse Practitioner

## 2017-07-03 VITALS — BP 150/80 | HR 80 | Ht 62.0 in | Wt 122.4 lb

## 2017-07-03 DIAGNOSIS — I5022 Chronic systolic (congestive) heart failure: Secondary | ICD-10-CM | POA: Diagnosis not present

## 2017-07-03 DIAGNOSIS — R0602 Shortness of breath: Secondary | ICD-10-CM | POA: Diagnosis not present

## 2017-07-03 MED ORDER — CARVEDILOL 3.125 MG PO TABS: 3.1250 mg | ORAL_TABLET | Freq: Two times a day (BID) | ORAL | 5 refills | Status: DC
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
## 2017-07-03 NOTE — Patient Instructions (Addendum)
We will be checking the following labs today - NONE   Medication Instructions:    Continue with your current medicines. BUT  We will go ahead and start the Coreg 3.125 mg twice a day - this has been sent to your pharmacy.     Testing/Procedures To Be Arranged:  N/A  Follow-Up:   See me in about 2 weeks.     Other Special Instructions:   Reminder to limit your salt.   Try to get a set of scales to weigh daily.     If you need a refill on your cardiac medications before your next appointment, please call your pharmacy.   Call the [facility] office at [phone] if you have any questions, problems or concerns.

## 2017-07-04 ENCOUNTER — Telehealth: Payer: Self-pay

## 2017-07-04 LAB — BASIC METABOLIC PANEL
BUN/Creatinine Ratio: 22 (ref 12–28)
BUN: 17 mg/dL (ref 8–27)
CO2: 21 mmol/L (ref 20–29)
Calcium: 10.1 mg/dL (ref 8.7–10.3)
Chloride: 102 mmol/L (ref 96–106)
Creatinine, Ser: 0.79 mg/dL (ref 0.57–1.00)
GFR calc Af Amer: 93 mL/min/{1.73_m2} (ref >59)
GFR calc non Af Amer: 81 mL/min/{1.73_m2} (ref >59)
Glucose: 92 mg/dL (ref 65–99)
Potassium: 4.8 mmol/L (ref 3.5–5.2)
Sodium: 141 mmol/L (ref 134–144)

## 2017-07-04 LAB — PRO B NATRIURETIC PEPTIDE: NT-Pro BNP: 1252 pg/mL — ABNORMAL HIGH (ref 0–287)

## 2017-07-04 MED ORDER — FUROSEMIDE 20 MG PO TABS: 20.0000 mg | ORAL_TABLET | Freq: Every day | ORAL | 3 refills | Status: DC

## 2017-07-04 NOTE — Telephone Encounter (Signed)
Patient aware of lab results. Per Norma Fredrickson NP, Labs are stable - does have some elevation in her fluid level - Coreg started at her visit yesterday - would also like to add low dose Lasix 20 mg a day. BMET on return visit and otherwise, would continue on current regimen as outlined at her visit. Patient verbalized understanding.

## 2017-07-04 NOTE — Telephone Encounter (Signed)
-----   Message from Rosalio Macadamia, NP sent at 07/04/2017  7:42 AM EDT ----- Ok to report. Labs are stable - does have some elevation in her fluid level - Coreg started at her visit yesterday - would also like to add low dose Lasix 20 mg a day. BMET on return visit and otherwise, would continue on current regimen as outlined at her visit.

## 2017-07-17 ENCOUNTER — Ambulatory Visit: Payer: BLUE CROSS/BLUE SHIELD | Admitting: Nurse Practitioner

## 2017-07-17 ENCOUNTER — Encounter: Payer: Self-pay | Admitting: Nurse Practitioner

## 2017-07-17 VITALS — BP 124/78 | HR 71 | Ht 62.0 in | Wt 122.4 lb

## 2017-07-17 DIAGNOSIS — I428 Other cardiomyopathies: Secondary | ICD-10-CM | POA: Diagnosis not present

## 2017-07-17 LAB — BASIC METABOLIC PANEL
BUN/Creatinine Ratio: 20 (ref 12–28)
BUN: 16 mg/dL (ref 8–27)
CO2: 24 mmol/L (ref 20–29)
Calcium: 9.9 mg/dL (ref 8.7–10.3)
Chloride: 100 mmol/L (ref 96–106)
Creatinine, Ser: 0.81 mg/dL (ref 0.57–1.00)
GFR calc Af Amer: 91 mL/min/{1.73_m2} (ref >59)
GFR calc non Af Amer: 79 mL/min/{1.73_m2} (ref >59)
Glucose: 97 mg/dL (ref 65–99)
Potassium: 4.2 mmol/L (ref 3.5–5.2)
Sodium: 140 mmol/L (ref 134–144)

## 2017-07-17 MED ORDER — SACUBITRIL-VALSARTAN 24-26 MG PO TABS: 1.0000 | ORAL_TABLET | Freq: Two times a day (BID) | ORAL | 6 refills | Status: DC
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
## 2017-07-17 NOTE — Patient Instructions (Addendum)
We will be checking the following labs today - BMET today  BMET in one week   Medication Instructions:    Continue with your current medicines. BUT  I am stopping the Losartan  I am starting you on Entresto 24-26 to take twice a day - use the samples - RX sent to your pharmacy    Testing/Procedures To Be Arranged:  N/A  Follow-Up:   See me in one month  See Dr. Anne Fu as new patient - first available    Other Special Instructions:   Talk to your PCP about the sleep issue    If you need a refill on your cardiac medications before your next appointment, please call your pharmacy.   Call the [facility] office at [phone] if you have any questions, problems or concerns.

## 2017-07-19 ENCOUNTER — Telehealth: Payer: Self-pay | Admitting: *Deleted

## 2017-07-19 NOTE — Telephone Encounter (Signed)
Prior authorization sent to [facility] on [address]

## 2017-07-19 NOTE — Addendum Note (Signed)
Addended by: Rosalio Macadamia on: 07/19/2017 03:50 PM   Modules accepted: Orders

## 2017-07-23 NOTE — Telephone Encounter (Signed)
We received a letter via fax from [facility] stating that they were unable to find the pt in their system.  I called the pts pharmacy and was advised that they have the same insurance info that we do so I did an insurance coverage check on the pt through ccovermymeds using the info off of the BCBS card that is scanned into the pts chart.  I was able to do an [place] through covermymeds. Awaiting response.

## 2017-07-24 ENCOUNTER — Telehealth: Payer: Self-pay | Admitting: *Deleted

## 2017-07-24 ENCOUNTER — Other Ambulatory Visit: Payer: BLUE CROSS/BLUE SHIELD

## 2017-07-24 DIAGNOSIS — I428 Other cardiomyopathies: Secondary | ICD-10-CM

## 2017-07-24 LAB — BASIC METABOLIC PANEL
BUN/Creatinine Ratio: 25 (ref 12–28)
BUN: 19 mg/dL (ref 8–27)
CO2: 22 mmol/L (ref 20–29)
Calcium: 9.6 mg/dL (ref 8.7–10.3)
Chloride: 104 mmol/L (ref 96–106)
Creatinine, Ser: 0.75 mg/dL (ref 0.57–1.00)
GFR calc Af Amer: 99 mL/min/{1.73_m2} (ref >59)
GFR calc non Af Amer: 86 mL/min/{1.73_m2} (ref >59)
Glucose: 103 mg/dL — ABNORMAL HIGH (ref 65–99)
Potassium: 4.4 mmol/L (ref 3.5–5.2)
Sodium: 140 mmol/L (ref 134–144)

## 2017-07-24 NOTE — Telephone Encounter (Signed)
Pt came in today for labs and stated throat and eyes have been itching.  Stated was probably allergies.  Pt is picking up allergy pills today,  It continues in itch call office next week.  Also stated husband is now on [facility]osp[facility]. Will send to [place] to [place].

## 2017-07-24 NOTE — Telephone Encounter (Signed)
Agree. Lets try the allergy medicine for now.

## 2017-08-01 ENCOUNTER — Ambulatory Visit (INDEPENDENT_AMBULATORY_CARE_PROVIDER_SITE_OTHER): Payer: BLUE CROSS/BLUE SHIELD | Admitting: Family Medicine

## 2017-08-01 ENCOUNTER — Telehealth: Payer: Self-pay

## 2017-08-01 ENCOUNTER — Encounter: Payer: Self-pay | Admitting: Family Medicine

## 2017-08-01 ENCOUNTER — Ambulatory Visit: Payer: BLUE CROSS/BLUE SHIELD | Admitting: Cardiology

## 2017-08-01 DIAGNOSIS — M19041 Primary osteoarthritis, right hand: Secondary | ICD-10-CM

## 2017-08-01 DIAGNOSIS — M19042 Primary osteoarthritis, left hand: Secondary | ICD-10-CM | POA: Diagnosis not present

## 2017-08-01 DIAGNOSIS — M19049 Primary osteoarthritis, unspecified hand: Secondary | ICD-10-CM

## 2017-08-01 DIAGNOSIS — I5020 Unspecified systolic (congestive) heart failure: Secondary | ICD-10-CM | POA: Diagnosis not present

## 2017-08-01 DIAGNOSIS — F321 Major depressive disorder, single episode, moderate: Secondary | ICD-10-CM | POA: Diagnosis not present

## 2017-08-01 HISTORY — DX: Unspecified systolic (congestive) heart failure: I50.20

## 2017-08-01 HISTORY — DX: Primary osteoarthritis, unspecified hand: M19.049

## 2017-08-01 MED ORDER — DICLOFENAC SODIUM 1 % TD GEL: 4.0000 g | Freq: Four times a day (QID) | TRANSDERMAL | 5 refills | Status: DC

## 2017-08-01 MED ORDER — CITALOPRAM HYDROBROMIDE 20 MG PO TABS: 40.0000 mg | ORAL_TABLET | Freq: Every day | ORAL | 3 refills | Status: DC

## 2017-08-01 NOTE — Assessment & Plan Note (Addendum)
Patient with significantly elevated PHQ to 22 today. Her MDQ was negative.  Discussed treatment options with patient.  She declined psychotherapy.  We will start Celexa 20 mg daily for 1-2 weeks.  She will then increase to 40 mg daily if she tolerates this well without side effects.  Discussed side effects of this medication.  She will follow-up with me in about 4 weeks, or sooner as needed.

## 2017-08-01 NOTE — Patient Instructions (Signed)
Start the voltaren gel.  Please start celexa. Take 1 pill daily for 1-2 weeks, then increase to 2 pills if you do well without side effects.  Come back to see me in 4 weeks, or sooner as needed.  Take care, Dr [name]

## 2017-08-01 NOTE — Progress Notes (Signed)
Subjective:  Anita Diaz is a 62 y.o. female who presents today with a chief complaint of mood disorder.   HPI:  Mood Disorder, established problem, worsening Patient with several year history of mood swings.  She was diagnosed with bipolar several years ago.  She has seen psychiatry in the past and has been on several medications, however does not remember the names of these medications.  Denies any prior episodes of mania.  Her mood has been depressed more recently due to new diagnosis of heart failure as well as stressors involving the health of her husband.  MDQ: Yes to 4 questions.  He has to question 2.  Serious problem.  Depression screen PHQ 2/9 08/01/2017  Decreased Interest 2  Down, Depressed, Hopeless 3  PHQ - 2 Score 5  Altered sleeping 3  Tired, decreased energy 3  Change in appetite 3  Feeling bad or failure about yourself  2  Trouble concentrating 2  Moving slowly or fidgety/restless 3  Suicidal thoughts 1  PHQ-9 Score 22   Heart failure with reduced ejection fraction, new problem This is a new diagnosis since our last visit.  She had an echocardiogram on 06/28/2017 which revealed an ejection fraction of 25-30%.  She has been evaluated by cardiology including a catheterization which was essentially normal.  Current medication regimen includes Coreg 3.125 mg twice daily, Lasix 20 mg daily, and Entresto 24-26 twice daily.  She tolerates all these well without side effects.  Still has occasional chest pain and shortness of breath.  Overall, her symptoms are stable.  Bilateral hand pain, chronic problem, new to this provider Several year history.  Stable over the past several months to years.  Located in her hands and fingers bilaterally.  She occasionally takes ibuprofen which does not significantly seem to help.  Pain is worse with weather.  No numbness or tingling.  No obvious alleviating factors.  ROS: Per HPI  PMH: She reports that she quit smoking about 9 years  ago. She has a 74.00 pack-year smoking history. She has never used smokeless tobacco. She reports that she drinks alcohol. She reports that she does not use drugs.   Objective:  Physical Exam: BP 118/76 (BP Location: Left Arm)   Pulse 67   Temp 98 F (36.7 C) (Oral)   Resp 14   Ht 5\' 2"  (1.575 m)   Wt 122 lb (55.3 kg)   SpO2 95%   BMI 22.31 kg/m   Gen: NAD, resting comfortably CV: RRR with no murmurs appreciated Pulm: NWOB, CTAB with no crackles, wheezes, or rhonchi MSK:  -Hands: Several nodules noted at DIP joints of fingers bilaterally.  Strength 5 out of 5.  Sensation light touch intact throughout. Skin: Warm, dry Neuro: Grossly normal, moves all extremities Psych: Normal affect and thought content  Assessment/Plan:  Depression, major, single episode, moderate (HCC) Patient with significantly elevated PHQ to 22 today. Her MDQ was negative.  Discussed treatment options with patient.  She declined psychotherapy.  We will start Celexa 20 mg daily for 1-2 weeks.  She will then increase to 40 mg daily if she tolerates this well without side effects.  Discussed side effects of this medication.  She will follow-up with me in about 4 weeks, or sooner as needed.  Heart failure with reduced ejection fraction (HCC) Stable.  No signs of volume overload.  Continue regimen including Coreg 3.125 mg twice daily, Lasix 20 mg daily, and Entresto 24-26 twice daily.  She has cardiology  follow-up scheduled for later this month.  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  We will start topical Voltaren gel.  If no improvement, would consider referral to sports medicine for further evaluation and treatment.  [name]. [name], MD 08/01/2017 9:09 AM

## 2017-08-01 NOTE — Telephone Encounter (Signed)
PA for voltaren gel approved through 04/23/2038.  Patient's pharmacy notified.

## 2017-08-01 NOTE — Assessment & Plan Note (Signed)
Patient with bilateral hand and finger osteoarthritis based on physical exam.  We will start topical Voltaren gel.  If no improvement, would consider referral to sports medicine for further evaluation and treatment.

## 2017-08-01 NOTE — Assessment & Plan Note (Signed)
Stable.  No signs of volume overload.  Continue regimen including Coreg 3.125 mg twice daily, Lasix 20 mg daily, and Entresto 24-26 twice daily.  She has cardiology follow-up scheduled for later this month.

## 2017-08-15 ENCOUNTER — Ambulatory Visit: Payer: BLUE CROSS/BLUE SHIELD | Admitting: Nurse Practitioner

## 2017-08-15 ENCOUNTER — Encounter: Payer: Self-pay | Admitting: Nurse Practitioner

## 2017-08-15 VITALS — BP 118/62 | HR 54 | Ht 62.0 in | Wt 124.1 lb

## 2017-08-15 DIAGNOSIS — I428 Other cardiomyopathies: Secondary | ICD-10-CM | POA: Diagnosis not present

## 2017-08-15 LAB — BASIC METABOLIC PANEL
BUN/Creatinine Ratio: 25 (ref 12–28)
BUN: 19 mg/dL (ref 8–27)
CO2: 25 mmol/L (ref 20–29)
Calcium: 9.3 mg/dL (ref 8.7–10.3)
Chloride: 101 mmol/L (ref 96–106)
Creatinine, Ser: 0.76 mg/dL (ref 0.57–1.00)
GFR calc Af Amer: 98 mL/min/{1.73_m2} (ref >59)
GFR calc non Af Amer: 85 mL/min/{1.73_m2} (ref >59)
Glucose: 95 mg/dL (ref 65–99)
Potassium: 4.4 mmol/L (ref 3.5–5.2)
Sodium: 139 mmol/L (ref 134–144)
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
## 2017-08-15 NOTE — Patient Instructions (Addendum)
We will be checking the following labs today - BMET   Medication Instructions:    Continue with your current medicines.     Testing/Procedures To Be Arranged:  N/A  Follow-Up:   See Dr. Anne Fu as planned next month    Other Special Instructions:   N/A    If you need a refill on your cardiac medications before your next appointment, please call your pharmacy.   Call the [facility] office at [phone] if you have any questions, problems or concerns.

## 2017-08-21 ENCOUNTER — Encounter: Payer: Self-pay | Admitting: Cardiology

## 2017-08-29 ENCOUNTER — Ambulatory Visit (INDEPENDENT_AMBULATORY_CARE_PROVIDER_SITE_OTHER): Payer: BLUE CROSS/BLUE SHIELD

## 2017-08-29 ENCOUNTER — Encounter: Payer: Self-pay | Admitting: Family Medicine

## 2017-08-29 ENCOUNTER — Ambulatory Visit: Payer: BLUE CROSS/BLUE SHIELD | Admitting: Family Medicine

## 2017-08-29 VITALS — BP 109/65 | HR 60 | Temp 98.8°F | Resp 14 | Ht 62.0 in | Wt 122.0 lb

## 2017-08-29 DIAGNOSIS — F321 Major depressive disorder, single episode, moderate: Secondary | ICD-10-CM | POA: Diagnosis not present

## 2017-08-29 DIAGNOSIS — Z1159 Encounter for screening for other viral diseases: Secondary | ICD-10-CM

## 2017-08-29 DIAGNOSIS — M542 Cervicalgia: Secondary | ICD-10-CM | POA: Insufficient documentation

## 2017-08-29 DIAGNOSIS — G8929 Other chronic pain: Secondary | ICD-10-CM

## 2017-08-29 DIAGNOSIS — R5383 Other fatigue: Secondary | ICD-10-CM | POA: Diagnosis not present

## 2017-08-29 DIAGNOSIS — Z114 Encounter for screening for human immunodeficiency virus [HIV]: Secondary | ICD-10-CM | POA: Diagnosis not present

## 2017-08-29 DIAGNOSIS — M25512 Pain in left shoulder: Secondary | ICD-10-CM | POA: Diagnosis not present

## 2017-08-29 DIAGNOSIS — M47812 Spondylosis without myelopathy or radiculopathy, cervical region: Secondary | ICD-10-CM | POA: Diagnosis not present

## 2017-08-29 HISTORY — DX: Cervicalgia: M54.2

## 2017-08-29 HISTORY — DX: Other fatigue: R53.83

## 2017-08-29 LAB — COMPREHENSIVE METABOLIC PANEL
ALBUMIN: 4.3 g/dL (ref 3.5–5.2)
ALK PHOS: 54 U/L (ref 39–117)
ALT: 13 U/L (ref 0–35)
AST: 14 U/L (ref 0–37)
BILIRUBIN TOTAL: 0.5 mg/dL (ref 0.2–1.2)
BUN: 19 mg/dL (ref 6–23)
CO2: 27 mEq/L (ref 19–32)
CREATININE: 0.81 mg/dL (ref 0.40–1.20)
Calcium: 9.4 mg/dL (ref 8.4–10.5)
Chloride: 102 mEq/L (ref 96–112)
GFR: 76.32 mL/min (ref >60.00)
Glucose, Bld: 94 mg/dL (ref 70–99)
Potassium: 4.3 mEq/L (ref 3.5–5.1)
Sodium: 137 mEq/L (ref 135–145)
TOTAL PROTEIN: 7.3 g/dL (ref 6.0–8.3)

## 2017-08-29 LAB — VITAMIN D 25 HYDROXY (VIT D DEFICIENCY, FRACTURES): VITD: 30.46 ng/mL (ref 30.00–100.00)

## 2017-08-29 LAB — CBC
HCT: 39.4 % (ref 36.0–46.0)
Hemoglobin: 13.1 g/dL (ref 12.0–15.0)
MCHC: 33.3 g/dL (ref 30.0–36.0)
MCV: 85.9 fl (ref 78.0–100.0)
PLATELETS: 394 10*3/uL (ref 150.0–400.0)
RBC: 4.58 Mil/uL (ref 3.87–5.11)
RDW: 14.9 % (ref 11.5–15.5)
WBC: 7.9 10*3/uL (ref 4.0–10.5)

## 2017-08-29 LAB — VITAMIN B12: Vitamin B-12: 197 pg/mL — ABNORMAL LOW (ref 211–911)

## 2017-08-29 LAB — TSH: TSH: 1.01 u[IU]/mL (ref 0.35–4.50)

## 2017-08-29 IMAGING — DX DG CERVICAL SPINE 2 OR 3 VIEWS
2 series · 2 of 2 positions shown · non-contrast
Comparison: [DATE] plain film exam.

CLINICAL DATA: 62-year-old female with pain posterior aspect of
neck extending into left side of neck and shoulder for 3 weeks. No
known injury. Two prior surgeries. Initial encounter.

EXAM:
CERVICAL SPINE - 2-3 VIEW

[cervical spine lat]
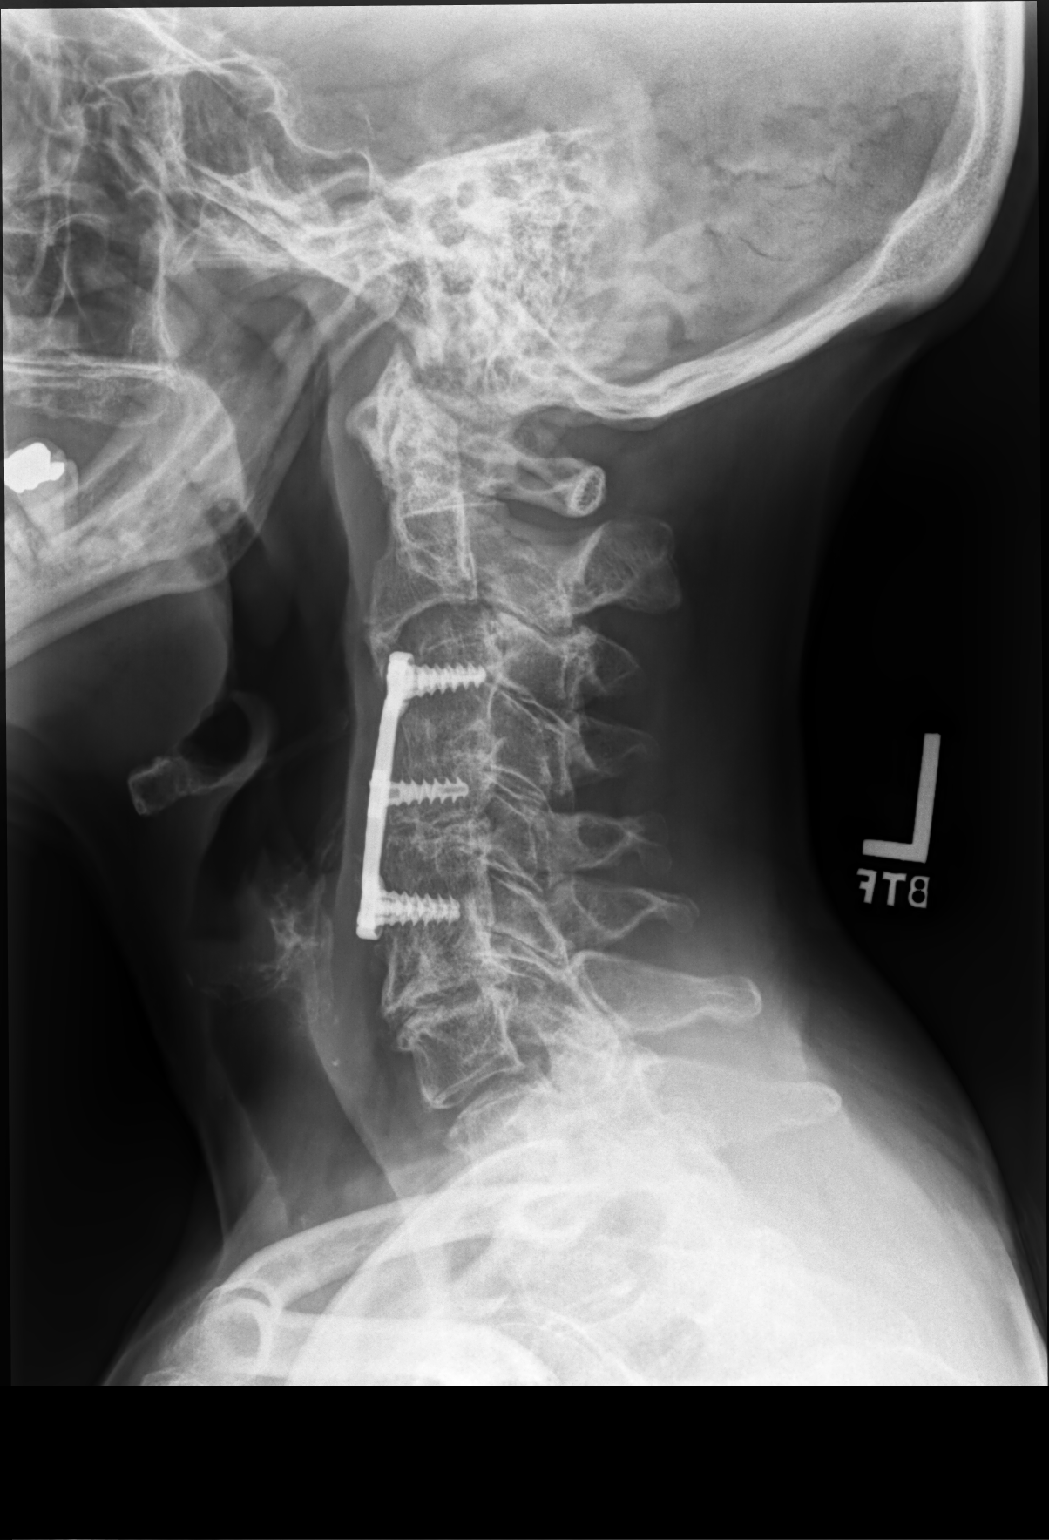

[cervical spine ap]
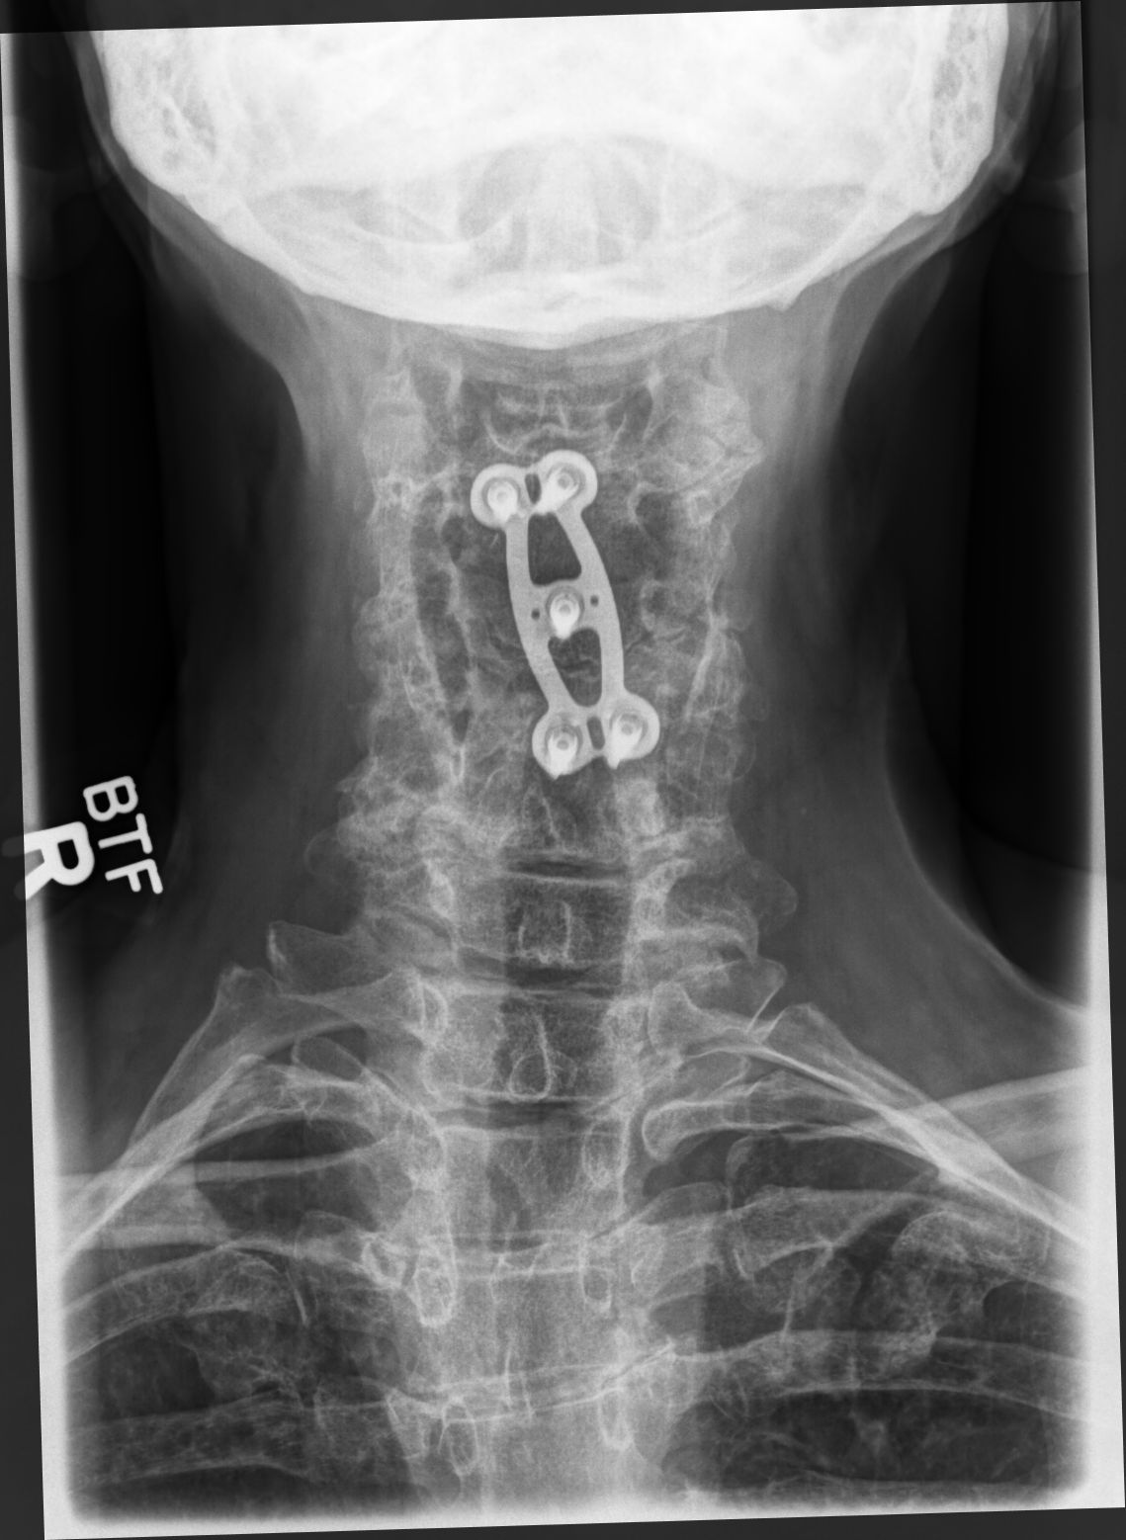

[2 of 2 positions shown; findings below may reference images not displayed]

FINDINGS: Prior fusion C3-C6.  Anterior plate and screws in place.

Progressive degenerative changes C6-7 with disc space narrowing and
osteophyte.

Slight curvature cervical and upper thoracic spine spine.

No lung apical lesion.
IMPRESSION: Prior fusion C3-C6.

Progressive degenerative changes C6-7 with disc space narrowing and
osteophyte.

Slight curvature cervical and upper thoracic spine spine.

## 2017-08-29 MED ORDER — METHYLPREDNISOLONE ACETATE 80 MG/ML IJ SUSP
80.0000 mg | Freq: Once | INTRAMUSCULAR | Status: AC
  Administered 2017-08-29: 80 mg via INTRA_ARTICULAR

## 2017-08-29 NOTE — Patient Instructions (Signed)
It was very nice to see you today!  I think you may have some rotator cuff pain.  We will perform an injection today to help you with your symptoms.  You can also try using Voltaren gel there as needed.  We will check blood work to look for other causes of your fatigue.  We also need to make sure that your heart is getting stronger and that her depression is adequately treated.  Lack of sleep will also cause you to feel fatigued.  We will not make any other changes today.  Please come back to see me in 1 to 3 months, or sooner as needed.  Take care, Dr [name]

## 2017-08-29 NOTE — Assessment & Plan Note (Signed)
Likely multifactorial.  We will check CBC, CMET, TSH, B12, and vitamin D levels today to rule out other causes.  Her congestive heart failure and depression are likely playing a role.  She has also having poor quality sleep was which is also likely playing a role, however patient defers getting sleep study today.  We will follow-up in 1 to 3 months.  Need to make sure her depression is adequately treated in addition to the above work-up.  Can readdress sleep study at that time.

## 2017-08-29 NOTE — Progress Notes (Signed)
Subjective:  Anita Diaz is a 62 y.o. female who presents today with a chief complaint of depression follow up.   HPI:  Bilateral Hand Pain, chronic problem Seen 1 month ago for this. Started on voltaren which works well when she remembers to take it.  Symptoms are stable.  Shoulder pain, chronic problem Left shoulder.  Present for the past several months to years.  Pain sometimes radiates down into her elbow.  No obvious precipitating events.  No history of injury or fall.  No treatments tried.  No numbness or tingling.  No weakness.  Symptoms are stable.  Fatigue, new problem Started a couple of months ago.  No obvious precipitating events.  No treatments tried.  Feels very sleepy during the day.  Denies any snoring or paroxysmal nocturnal dyspnea.  States that she does not want to be tested for sleep apnea as she would not wear CPAP machine.   Depression, established problem, Stable Patient seen for this about a month ago.  That time we started Celexa.  She has been on 40 mg for the past few weeks.  Overall, she thinks that this has been working well for her.  Feels less stressed.  No noted side effects.  Depression screen PHQ 2/9 08/29/2017  Decreased Interest 3  Down, Depressed, Hopeless 2  PHQ - 2 Score 5  Altered sleeping 3  Tired, decreased energy 3  Change in appetite 2  Feeling bad or failure about yourself  2  Trouble concentrating 2  Moving slowly or fidgety/restless 2  Suicidal thoughts 2  PHQ-9 Score 21  Difficult doing work/chores Somewhat difficult   ROS: Per HPI  PMH: She reports that she quit smoking about 9 years ago. She has a 74.00 pack-year smoking history. She has never used smokeless tobacco. She reports that she drinks alcohol. She reports that she does not use drugs.   Objective:  Physical Exam: BP 109/65   Pulse 60   Temp 98.8 F (37.1 C)   Resp 14   Ht 5\' 2"  (1.575 m)   Wt 122 lb (55.3 kg)   SpO2 96%   BMI 22.31 kg/m   Gen: NAD,  resting comfortably CV: RRR with no murmurs appreciated Pulm: NWOB, CTAB with no crackles, wheezes, or rhonchi MSK: -Neck: Limited leftward and rightward rotation.  No deformities.  Nontender to palpation.  Spurling negative bilaterally. -Left shoulder: No deformities.  Full range of motion throughout.  Significant pain and weakness with supraspinatus testing, and internal/external rotation. -Bilateral hands: Osteoarthritic changes noted bilaterally.  Shoulder Injection Procedure Note  Pre-operative Diagnosis: Rotator Cuff tendinitis  Post-operative Diagnosis: same  Indications: Pain Relief   Anesthesia: Topical ethyl chloride  Procedure Details   Written consent was obtained for the procedure. The shoulder was prepped with iodine and the skin was anesthetized with topical ethyl choride. Using a 22 gauge needle the subacromial space was injected with 3 mL 1% lidocaine and 1 mL of 80cc/ml depomedrol under the posterior aspect of the acromion. The injection site was cleansed with topical isopropyl alcohol and a dressing was applied.  Complications:  None; patient tolerated the procedure well.  Assessment/Plan:  Chronic left shoulder pain Exam consistent with rotator cuff pathology.  She may have a small amount of degenerative changes in her neck contributing as well.  Shoulder injection was performed today-see above procedure note.  Encourage patient to keep ice to the area and use topical Voltaren as needed.  Will avoid systemic NSAIDs at  this time given her history of heart failure.  She will follow-up with me in 1 to 3 months.  Consider referral to sports medicine or orthopedics pending effectiveness of injection.  Neck pain Likely secondary to degenerative changes.  No red flag signs or symptoms.  She has not had any imaging in several years.  We will obtain plain film today to further evaluate degree of degenerative changes.  This is also likely contributing to her shoulder  pain.  Depression, major, single episode, moderate (HCC) PHQ 9 still significantly elevated.  Discussed treatment options with patient.  She wishes to continue with current dose of Celexa for the next few weeks.  Discussed reasons to return to care.  She will follow-up with me in the next 1 to 3 months depending on her response to Celexa 40 mg daily.  Other fatigue Likely multifactorial.  We will check CBC, CMET, TSH, B12, and vitamin D levels today to rule out other causes.  Her congestive heart failure and depression are likely playing a role.  She has also having poor quality sleep was which is also likely playing a role, however patient defers getting sleep study today.  We will follow-up in 1 to 3 months.  Need to make sure her depression is adequately treated in addition to the above work-up.  Can readdress sleep study at that time.  Preventive health care Check hepatitis C and HIV antibody today.  [name]. [name], MD 08/29/2017 10:17 AM

## 2017-08-29 NOTE — Assessment & Plan Note (Signed)
PHQ 9 still significantly elevated.  Discussed treatment options with patient.  She wishes to continue with current dose of Celexa for the next few weeks.  Discussed reasons to return to care.  She will follow-up with me in the next 1 to 3 months depending on her response to Celexa 40 mg daily.

## 2017-08-29 NOTE — Assessment & Plan Note (Signed)
Likely secondary to degenerative changes.  No red flag signs or symptoms.  She has not had any imaging in several years.  We will obtain plain film today to further evaluate degree of degenerative changes.  This is also likely contributing to her shoulder pain.

## 2017-08-29 NOTE — Assessment & Plan Note (Signed)
Exam consistent with rotator cuff pathology.  She may have a small amount of degenerative changes in her neck contributing as well.  Shoulder injection was performed today-see above procedure note.  Encourage patient to keep ice to the area and use topical Voltaren as needed.  Will avoid systemic NSAIDs at this time given her history of heart failure.  She will follow-up with me in 1 to 3 months.  Consider referral to sports medicine or orthopedics pending effectiveness of injection.

## 2017-08-30 LAB — HEPATITIS C ANTIBODY
Hepatitis C Ab: NONREACTIVE
SIGNAL TO CUT-OFF: 0.02 (ref <1.00)

## 2017-08-30 LAB — HIV ANTIBODY (ROUTINE TESTING W REFLEX): HIV 1&2 Ab, 4th Generation: NONREACTIVE

## 2017-09-07 ENCOUNTER — Ambulatory Visit (INDEPENDENT_AMBULATORY_CARE_PROVIDER_SITE_OTHER): Payer: BLUE CROSS/BLUE SHIELD

## 2017-09-07 DIAGNOSIS — E538 Deficiency of other specified B group vitamins: Secondary | ICD-10-CM | POA: Diagnosis not present

## 2017-09-07 MED ORDER — CYANOCOBALAMIN 1000 MCG/ML IJ SOLN
1000.0000 ug | Freq: Once | INTRAMUSCULAR | Status: AC
  Administered 2017-09-07: 1000 ug via INTRAMUSCULAR

## 2017-09-07 NOTE — Progress Notes (Signed)
Patient received vitamin B12 1000 mcg IM in left deltoid.  Will schedule another vitamin B12 injection in 1 week.

## 2017-09-10 ENCOUNTER — Encounter: Payer: Self-pay | Admitting: Cardiology

## 2017-09-10 ENCOUNTER — Encounter (INDEPENDENT_AMBULATORY_CARE_PROVIDER_SITE_OTHER): Payer: Self-pay

## 2017-09-10 ENCOUNTER — Ambulatory Visit: Payer: BLUE CROSS/BLUE SHIELD | Admitting: Cardiology

## 2017-09-10 VITALS — BP 116/74 | HR 72 | Ht 62.0 in | Wt 123.8 lb

## 2017-09-10 DIAGNOSIS — I5022 Chronic systolic (congestive) heart failure: Secondary | ICD-10-CM

## 2017-09-10 DIAGNOSIS — I428 Other cardiomyopathies: Secondary | ICD-10-CM

## 2017-09-10 MED ORDER — CARVEDILOL 6.25 MG PO TABS: 6.2500 mg | ORAL_TABLET | Freq: Two times a day (BID) | ORAL | 3 refills | Status: DC
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
## 2017-09-10 NOTE — Patient Instructions (Addendum)
Medication Instructions:  Please increase your Carvedilol to 6.25 mg twice a day. Continue all other medications as listed.  Testing/Procedures: Your physician has requested that you have an echocardiogram (mid June 2019). Echocardiography is a painless test that uses sound waves to create images of your heart. It provides your doctor with information about the size and shape of your heart and how well your heart's chambers and valves are working. This procedure takes approximately one hour. There are no restrictions for this procedure.  Follow-Up: Follow up with Norma Fredrickson, NP after your 2 D Echo.  If you need a refill on your cardiac medications before your next appointment, please call your pharmacy.  Thank you for choosing [facility]!!

## 2017-09-13 ENCOUNTER — Ambulatory Visit (INDEPENDENT_AMBULATORY_CARE_PROVIDER_SITE_OTHER): Payer: BLUE CROSS/BLUE SHIELD

## 2017-09-13 DIAGNOSIS — E538 Deficiency of other specified B group vitamins: Secondary | ICD-10-CM

## 2017-09-13 MED ORDER — CYANOCOBALAMIN 1000 MCG/ML IJ SOLN
1000.0000 ug | Freq: Once | INTRAMUSCULAR | Status: AC
  Administered 2017-09-13: 1000 ug via INTRAMUSCULAR

## 2017-09-13 NOTE — Progress Notes (Signed)
Patient here today for a nurse visit to receive B12 IM injection, 1000 mcg. Patient tolerated well. Patient will return next week for another injection.

## 2017-09-20 ENCOUNTER — Telehealth: Payer: Self-pay

## 2017-09-20 ENCOUNTER — Other Ambulatory Visit: Payer: Self-pay

## 2017-09-20 ENCOUNTER — Ambulatory Visit (INDEPENDENT_AMBULATORY_CARE_PROVIDER_SITE_OTHER): Payer: BLUE CROSS/BLUE SHIELD

## 2017-09-20 DIAGNOSIS — E538 Deficiency of other specified B group vitamins: Secondary | ICD-10-CM

## 2017-09-20 DIAGNOSIS — G8929 Other chronic pain: Secondary | ICD-10-CM

## 2017-09-20 DIAGNOSIS — M25512 Pain in left shoulder: Principal | ICD-10-CM

## 2017-09-20 MED ORDER — CYANOCOBALAMIN 1000 MCG/ML IJ SOLN
1000.0000 ug | Freq: Once | INTRAMUSCULAR | Status: AC
  Administered 2017-09-20: 1000 ug via INTRAMUSCULAR

## 2017-09-20 NOTE — Progress Notes (Signed)
Per orders of Dr.Parker, injection of B 12 given by Donnamarie Poag. Patient tolerated injection well. Injection given in right arm.

## 2017-09-20 NOTE — Telephone Encounter (Signed)
Thank you.  -CMP

## 2017-09-20 NOTE — Telephone Encounter (Signed)
Patient in office today for b 12 injection. She is still having shoulder pain. Wanted to know why results for x ray were never given. After reviewing chart gave results for xray under lab notes from same day. Per note if still having pain you wanted her to see Berline Chough. I have made app for patient and will put referral in system for them.

## 2017-09-25 ENCOUNTER — Ambulatory Visit: Payer: BLUE CROSS/BLUE SHIELD | Admitting: Sports Medicine

## 2017-09-25 ENCOUNTER — Telehealth: Payer: Self-pay

## 2017-09-25 ENCOUNTER — Encounter: Payer: Self-pay | Admitting: Sports Medicine

## 2017-09-25 ENCOUNTER — Ambulatory Visit (INDEPENDENT_AMBULATORY_CARE_PROVIDER_SITE_OTHER): Payer: BLUE CROSS/BLUE SHIELD

## 2017-09-25 VITALS — BP 122/84 | HR 56 | Ht 62.0 in | Wt 125.8 lb

## 2017-09-25 DIAGNOSIS — E538 Deficiency of other specified B group vitamins: Secondary | ICD-10-CM | POA: Diagnosis not present

## 2017-09-25 DIAGNOSIS — M75102 Unspecified rotator cuff tear or rupture of left shoulder, not specified as traumatic: Secondary | ICD-10-CM | POA: Diagnosis not present

## 2017-09-25 DIAGNOSIS — M542 Cervicalgia: Secondary | ICD-10-CM | POA: Diagnosis not present

## 2017-09-25 DIAGNOSIS — G2589 Other specified extrapyramidal and movement disorders: Secondary | ICD-10-CM

## 2017-09-25 DIAGNOSIS — M479 Spondylosis, unspecified: Secondary | ICD-10-CM

## 2017-09-25 DIAGNOSIS — M25512 Pain in left shoulder: Secondary | ICD-10-CM

## 2017-09-25 DIAGNOSIS — G8929 Other chronic pain: Secondary | ICD-10-CM

## 2017-09-25 MED ORDER — GABAPENTIN 300 MG PO CAPS: ORAL_CAPSULE | ORAL | 1 refills | Status: DC

## 2017-09-25 MED ORDER — METHYLPREDNISOLONE 4 MG PO TBPK: ORAL_TABLET | ORAL | 0 refills | Status: DC

## 2017-09-25 MED ORDER — CYANOCOBALAMIN 1000 MCG/ML IJ SOLN
1000.0000 ug | Freq: Once | INTRAMUSCULAR | Status: AC
  Administered 2017-09-25: 1000 ug via INTRAMUSCULAR

## 2017-09-25 NOTE — Telephone Encounter (Signed)
Could you please call this patient and schedule her for an office visit?  Thank you!

## 2017-09-25 NOTE — Telephone Encounter (Signed)
Please ask pt to schedule appointment soon to discuss changing her meds.  [name]. [name], MD 09/25/2017 11:51 AM

## 2017-09-25 NOTE — Telephone Encounter (Signed)
Pt was in to see Dr. Berline Chough today and wanted to let Dr. [name] know that she doesn't feel any different since starting her new antidepressant. She said that She has been on it for about 2 months and there has been no change in her sx.

## 2017-09-25 NOTE — Telephone Encounter (Signed)
Noted  

## 2017-09-25 NOTE — Progress Notes (Deleted)
   Anita Diaz - 62 y.o. female MRN [id number]  Date of birth: 02/22/1956  Visit Date:   PCP: Ardith Dark, MD   Referred by: Ardith Dark, MD  Please see additional documentation for HPI, review of systems.  HISTORY & PERTINENT PRIOR DATA:  Prior History reviewed and updated per electronic medical record.  Significant/pertinent history, findings, studies include:  reports that she quit smoking about 9 years ago. She has a 74.00 pack-year smoking history. She has never used smokeless tobacco. No results for input(s): HGBA1C, LABURIC, CREATINE in the last 8760 hours. No specialty comments available. No problems updated.  OBJECTIVE:  VS:  HT:5\' 2"  (157.5 cm)   WT:125 lb 12.8 oz (57.1 kg)  BMI:23    BP:122/84  HR: (Abnormal) 56 bpm  TEMP: ( )  RESP:98 %   PHYSICAL EXAM: WDWN, Non-toxic appearing. Psychiatric: Alert & appropriately interactive.  Not depressed or anxious appearing. Respiratory: No increased work of breathing.  Trachea Midline Eyes: Pupils are equal.  EOM intact without nystagmus.  No scleral icterus  Vascular Exam: warm to touch no edema Radial Pulses: normal and present  upper extremity neuro exam: ***  MSK Exam: ***   ASSESSMENT & PLAN:  1. Chronic left shoulder pain   2. Neck pain   3. Spondylosis   4. Rotator cuff syndrome of left shoulder   5. Scapular dyskinesis     PLAN:  ***   Follow-up: No follow-ups on file.   {No problem-specific Assessment & Plan notes found for this encounter.  Orders Placed This Encounter  Procedures  ? Ambulatory referral to Physical Therapy     Meds ordered this encounter  Medications  ? gabapentin (NEURONTIN) 300 MG capsule    Sig: Start with 1 tab po qhs X 1 week, then increase to 1 tab po bid X 1 week then 1 tab po tid prn    Dispense:  90 capsule    Refill:  1  ? methylPREDNISolone (MEDROL DOSEPAK) 4 MG TBPK tablet    Sig: Take by mouth as directed. Take 6 tablets on the first day prescribed  then as directed.    Dispense:  21 tablet    Refill:  0     Future Appointments  Date Time Provider Department Center  09/25/2017  9:00 AM Dierdre Searles, CMA [facility]  10/02/2017  8:30 AM [facility] 2 [facility]  10/17/2017  8:00 AM Rosalio Macadamia, NP [facility]  10/30/2017  8:00 AM Ardith Dark, MD [facility]    }

## 2017-09-25 NOTE — Telephone Encounter (Signed)
I left a messages for the patient. She is scheduled to see [name] in July but left a message if she would like appointment sooner to change meds.

## 2017-09-25 NOTE — Progress Notes (Signed)
Cyanocobalamin 1000 mcg/mL, 1 mL given IM, R Deltoid, mfg: [employer], lot#: [id number], exp: OCT20, ndc: [id number]

## 2017-09-25 NOTE — Progress Notes (Signed)
Anita Diaz. [facility] [phone]  Anita Diaz - 62 y.o. female MRN [id number]  Date of birth: Nov 16, 1955  Visit Date: 09/25/2017  PCP: Ardith Dark, MD   Referred by: Ardith Dark, MD  Scribe for today's visit: Stevenson Clinch, CMA     SUBJECTIVE:  Anita Diaz is here for Initial Assessment (Neck and L shoulder pain)  Her neck and L shoulder pain symptoms INITIALLY: Began several months ago and MOI is unknown. She has hx of neck surgery x 2. She has plates E9-B2.  Described as severe aching, radiating to the L arm but not past the elbow. Pain is described as shooting pains.  Pain is constant, nothing seems to make it worse or better.  Additional associated symptoms include: She c/o tingling in L arm.     At this time symptoms are worsening compared to onset, more severe and constant.  She has been taking Advil or Aleve with some relief. She received steroid injection a couple of weeks ago and got minimal relief.   ROS Reports night time disturbances. Denies fevers, chills, or night sweats. Denies unexplained weight loss. Denies personal history of cancer. Denies changes in bowel or bladder habits. Denies recent unreported falls. Reports dyspnea or wheezing d/t heart trouble. Denies headaches or dizziness.  Reports tingling in L arm, at times its hard to raise the L arm.  Reports dizziness or presyncopal episodes over the past couple of weeks.  Reports lower extremity edema    HISTORY & PERTINENT PRIOR DATA:  Prior History reviewed and updated per electronic medical record.  Significant/pertinent history, findings, studies include:  reports that she quit smoking about 9 years ago. She has a 74.00 pack-year smoking history. She has never used smokeless tobacco. No results for input(s): HGBA1C, LABURIC, CREATINE in the last 8760 hours. No specialty comments available. No problems  updated.  OBJECTIVE:  VS:  HT:5\' 2"  (157.5 cm)   WT:125 lb 12.8 oz (57.1 kg)  BMI:23    BP:122/84  HR:(Abnormal) 56bpm  TEMP: ( )  RESP:98 %   PHYSICAL EXAM: Constitutional: WDWN, Non-toxic appearing. Psychiatric: Alert & appropriately interactive.  Not depressed or anxious appearing. Respiratory: No increased work of breathing.  Trachea Midline Eyes: Pupils are equal.  EOM intact without nystagmus.  No scleral icterus  Vascular Exam: warm to touch no edema  upper extremity neuro exam: Upper extremity strength is 5/5 in all myotomes with shoulder strength is decreased with shoulder abduction. Upper extremity sensation is intact to light touch in all dermatomes Upper extremity reflexes are 3+/4 diffusely.  Normal Hoffmann's.  MSK Exam: Mild pain with arm squeeze test and brachial plexus squeeze on the left, negative on the right.  She has marked scapular dyskinesis.  Intrinsic rotator cuff strength is intact however she has shoulder abduction strength is diminished in guarded due to pain.  No significant pain with axial load and circumduction.   ASSESSMENT & PLAN:   1. Chronic left shoulder pain   2. Neck pain   3. Spondylosis   4. Rotator cuff syndrome of left shoulder   5. Scapular dyskinesis     PLAN: Believe that she has a superimposed rotator cuff syndrome on top of a cervical radiculopathy.  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  If any persistent symptoms or lack of improvement further diagnostic evaluation of her left shoulder  with MSK ultrasound will be pursued prior to obtaining further imaging of her neck but this may need to be considered as well given the worsening adjacent level disease.  Gabapentin and Medrol Dosepak to be used cautiously with underlying bipolar disorder.  Referral to physical therapy.  Discussed the foundation of treatment for this condition is physical therapy and/or daily (5-6  days/week) therapeutic exercises, focusing on core strengthening, coordination, neuromuscular control/reeducation.  Therapeutic exercises prescribed per procedure note.  Follow-up: Return in about 6 weeks (around 11/06/2017).      Please see additional documentation for Objective, Assessment and Plan sections. Pertinent additional documentation may be included in corresponding procedure notes, imaging studies, problem based documentation and patient instructions. Please see these sections of the encounter for additional information regarding this visit.  CMA/ATC served as Neurosurgeon during this visit. History, Physical, and Plan performed by medical provider. Documentation and orders reviewed and attested to.      Anita Mews, DO    [facility]

## 2017-10-02 ENCOUNTER — Other Ambulatory Visit: Payer: Self-pay

## 2017-10-02 ENCOUNTER — Ambulatory Visit (HOSPITAL_COMMUNITY): Payer: BLUE CROSS/BLUE SHIELD | Attending: Cardiovascular Disease

## 2017-10-02 DIAGNOSIS — Z87891 Personal history of nicotine dependence: Secondary | ICD-10-CM | POA: Insufficient documentation

## 2017-10-02 DIAGNOSIS — I080 Rheumatic disorders of both mitral and aortic valves: Secondary | ICD-10-CM | POA: Insufficient documentation

## 2017-10-02 DIAGNOSIS — E785 Hyperlipidemia, unspecified: Secondary | ICD-10-CM | POA: Insufficient documentation

## 2017-10-02 DIAGNOSIS — I428 Other cardiomyopathies: Secondary | ICD-10-CM | POA: Diagnosis not present

## 2017-10-02 DIAGNOSIS — I509 Heart failure, unspecified: Secondary | ICD-10-CM | POA: Insufficient documentation

## 2017-10-02 DIAGNOSIS — I110 Hypertensive heart disease with heart failure: Secondary | ICD-10-CM | POA: Insufficient documentation

## 2017-10-04 ENCOUNTER — Ambulatory Visit: Payer: BLUE CROSS/BLUE SHIELD | Admitting: Physical Therapy

## 2017-10-04 ENCOUNTER — Telehealth: Payer: Self-pay | Admitting: Cardiology

## 2017-10-04 ENCOUNTER — Encounter: Payer: Self-pay | Admitting: Physical Therapy

## 2017-10-04 VITALS — HR 56

## 2017-10-04 DIAGNOSIS — R931 Abnormal findings on diagnostic imaging of heart and coronary circulation: Secondary | ICD-10-CM

## 2017-10-04 DIAGNOSIS — M25512 Pain in left shoulder: Secondary | ICD-10-CM

## 2017-10-04 DIAGNOSIS — M542 Cervicalgia: Secondary | ICD-10-CM | POA: Diagnosis not present

## 2017-10-04 DIAGNOSIS — I5022 Chronic systolic (congestive) heart failure: Secondary | ICD-10-CM

## 2017-10-04 NOTE — Telephone Encounter (Signed)
Pt calling    Stated she is returning call to nurse concerning results of her Echo. Please call pt.
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
## 2017-10-04 NOTE — Patient Instructions (Signed)
Access Code: [email or web address]  URL: [email or web address]  Date: 10/04/2017  Prepared by: Sedalia Muta   Exercises  Seated Scapular Retraction - 10 reps - 2 sets - 2x daily  Seated Cervical Retraction - 10 reps - 2 sets - 2x daily  Supine Chin Tuck - 10 reps - 2 sets - 2x daily  Supine Shoulder Flexion with Dowel - 10 reps - 2 sets - 2x daily  Supine Shoulder Flexion PROM - 10 reps - 2 sets - 2x daily

## 2017-10-04 NOTE — Telephone Encounter (Signed)
EF remains less than 35%. I would like for her to discuss ICD/CRT with electrophysiology. Please set up consultation. Donato Schultz, MD  Pt aware of results and recommendations.  She will await a call to be scheduled with EP.

## 2017-10-05 ENCOUNTER — Telehealth: Payer: Self-pay | Admitting: Cardiology

## 2017-10-05 NOTE — Telephone Encounter (Signed)
New Message:      Pt is calling to see if she still needs to keep her appt with Tyrone Sage due to being told on yesterday that she needs to see and EP doctor

## 2017-10-05 NOTE — Telephone Encounter (Signed)
Spoke with the pt and advised her to keep her follow-up appt with Norma Fredrickson NP for 10/17/17 at 0800, for Dr Anne Fu noted this is for follow-up of her echo results.  Pt verbalized understanding and agrees with this plan.

## 2017-10-09 ENCOUNTER — Encounter: Payer: Self-pay | Admitting: Physical Therapy

## 2017-10-09 ENCOUNTER — Ambulatory Visit: Payer: BLUE CROSS/BLUE SHIELD | Admitting: Physical Therapy

## 2017-10-09 VITALS — HR 50

## 2017-10-09 DIAGNOSIS — M542 Cervicalgia: Secondary | ICD-10-CM | POA: Diagnosis not present

## 2017-10-09 DIAGNOSIS — M25512 Pain in left shoulder: Secondary | ICD-10-CM
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
## 2017-10-16 ENCOUNTER — Encounter: Payer: BLUE CROSS/BLUE SHIELD | Admitting: Physical Therapy

## 2017-10-17 ENCOUNTER — Ambulatory Visit: Payer: BLUE CROSS/BLUE SHIELD | Admitting: Nurse Practitioner

## 2017-10-17 ENCOUNTER — Encounter: Payer: Self-pay | Admitting: Nurse Practitioner

## 2017-10-17 VITALS — BP 106/78 | HR 62 | Ht 62.0 in | Wt 128.4 lb

## 2017-10-17 DIAGNOSIS — I5022 Chronic systolic (congestive) heart failure: Secondary | ICD-10-CM

## 2017-10-17 DIAGNOSIS — I428 Other cardiomyopathies: Secondary | ICD-10-CM

## 2017-10-17 NOTE — Patient Instructions (Addendum)
We will be checking the following labs today - NONE   Medication Instructions:    Continue with your current medicines.     Testing/Procedures To Be Arranged:  N/A  Follow-Up:   See Dr. Ladona Ridgel as planned next week  See me in 3 months    Other Special Instructions:   The number for Social Security is [phone]    If you need a refill on your cardiac medications before your next appointment, please call your pharmacy.   Call the [facility] office at [phone] if you have any questions, problems or concerns.
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
## 2017-10-22 ENCOUNTER — Encounter: Payer: Self-pay | Admitting: Internal Medicine

## 2017-10-22 ENCOUNTER — Ambulatory Visit: Payer: BLUE CROSS/BLUE SHIELD | Admitting: Internal Medicine

## 2017-10-22 VITALS — BP 110/60 | HR 52 | Ht 62.0 in | Wt 127.6 lb

## 2017-10-22 DIAGNOSIS — I428 Other cardiomyopathies: Secondary | ICD-10-CM | POA: Diagnosis not present

## 2017-10-22 NOTE — Patient Instructions (Addendum)
Medication Instructions:  Your physician recommends that you continue on your current medications as directed. Please refer to the Current Medication list given to you today.  Labwork: None ordered.  Testing/Procedures: Your physician has recommended that you have a defibrillator inserted. An implantable cardioverter defibrillator (ICD) is a small device that is placed in your chest or, in rare cases, your abdomen. This device uses electrical pulses or shocks to help control life-threatening, irregular heartbeats that could lead the heart to suddenly stop beating (sudden cardiac arrest). Leads are attached to the ICD that goes into your heart. This is done in the hospital and usually requires an overnight stay. Please see the instruction sheet given to you today for more information.  Follow-Up:  July 8, 10, 11, 15, 16, 18 and 29 August 13, 15, 20, 22, 28 and 29  If you decide to go ahead with the procedure please call me: Ancil Boozer, RN with Dr. Ladona Ridgel [phone]   Biventricular Pacemaker Implantation A biventricular pacemaker implantation is a procedure to place (implant) a pacemaker into both of the lower chambers (ventricles) of the heart. A pacemaker is a small, battery-powered device that helps control the heartbeat. If the heart beats irregularly or too slowly (bradycardia), the pacemaker will pace the heart so that it beats at a normal rate or a programmed rate. The parts of a biventricular pacemaker include:  The pulse generator. The pulse generator contains a small computer and a memory system that is programmed to keep the heart beating at a certain rate. The pulse generator also produces the electrical signal that triggers the heart to beat. This is implanted under the skin of the upper chest, near the collarbone.  Wires (leads). The leads are placed in the left and right ventricles of the heart. The leads are connected to the pulse generator. They transmit electrical pulses  from the pulse generator to the heart.  This procedure may be done to treat:  Bradycardia.  Symptoms of severe heart failure, such as shortness of breath (dyspnea).  Loss of consciousness that happens repeatedly (syncope) because of an irregular heart rate.  Tell a health care provider about:  Any allergies you have.  All medicines you are taking, including vitamins, herbs, eye drops, creams, and over-the-counter medicines.  Any problems you or family members have had with anesthetic medicines.  Any blood disorders you have.  Any surgeries you have had.  Any medical conditions you have.  Whether you are pregnant or may be pregnant. What are the risks? Generally, this is a safe procedure. However, problems may occur, including:  Infection.  Bleeding.  Allergic reactions to medicines or dyes.  Damage to other structures or organs, such as your blood vessels, lungs, or heart.  Failure of the pacemaker to improve your condition.  What happens before the procedure?  Ask your health care provider about: ? Changing or stopping your regular medicines. This is especially important if you are taking diabetes medicines or blood thinners. ? Taking medicines such as aspirin and ibuprofen. These medicines can thin your blood. Do not take these medicines before your procedure if your health care provider instructs you not to.  Follow instructions from your health care provider about eating or drinking restrictions.  Do not use any tobacco products for at least 24 hours before your procedure. This includes cigarettes, chewing tobacco, or e-cigarettes.  Ask your health care provider how your surgical site will be marked or identified.  You may be given antibiotic medicine to  help prevent infection.  You may have tests, including: ? Blood tests. ? Chest X-rays.  Plan to have someone take you home after the procedure.  If you go home right after the procedure, plan to have  someone with you for 24 hours. What happens during the procedure?  To reduce your risk of infection: ? Your health care team will wash or sanitize their hands. ? Your skin will be washed with soap. ? Hair may be removed from your surgical area.  An IV tube will be inserted into one of your veins.  You will be given one or more of the following: ? A medicine to help you relax (sedative). ? A medicine to make you fall asleep (general anesthetic). ? A medicine that is injected into your spine to numb the area below and slightly above the injection site (spinal anesthetic). ? A medicine that is injected into an area of your body to numb everything below the injection site (regional anesthetic).  An incision will be made in your upper chest, near your heart.  The leads will be guided into your incision, through your blood vessels, and into your ventricles. Your surgeon will use an X-ray machine (fluoroscope) to guide the leads into your heart.  The leads will be attached to your heart muscles and to the pulse generator.  The leads will be tested to make sure that they work correctly.  The pulse generator will be implanted under your skin, near your incision.  Your incision will be closed with stitches (sutures), skin glue, or adhesive tape.  A bandage (dressing) will be placed over your incision. The procedure may vary among health care providers and hospitals. What happens after the procedure?  Your blood pressure, heart rate, breathing rate, and blood oxygen level will be monitored often until the medicines you were given have worn off.  You may continue to receive fluids and medicines through an IV tube.  You will have some pain. Pain medicines will be available to help you.  You will have a chest X-ray done. This is to make sure that your pacemaker is in the right place.  You may have to wear compression stockings. These stockings help to prevent blood clots and reduce swelling  in your legs.  You will be given a pacemaker identification card. This card lists the implant date, device model, and manufacturer of your pacemaker.  Do not drive for 24 hours if you received a sedative. This information is not intended to replace advice given to you by your health care provider. Make sure you discuss any questions you have with your health care provider. Document Released: 01/03/2012 Document Revised: 09/16/2015 Document Reviewed: 01/03/2015 Elsevier Interactive Patient Education  [employer].
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
## 2017-10-23 ENCOUNTER — Telehealth: Payer: Self-pay

## 2017-10-23 ENCOUNTER — Encounter: Payer: Self-pay | Admitting: Physical Therapy

## 2017-10-23 ENCOUNTER — Ambulatory Visit: Payer: BLUE CROSS/BLUE SHIELD | Admitting: Physical Therapy

## 2017-10-23 DIAGNOSIS — I5022 Chronic systolic (congestive) heart failure: Secondary | ICD-10-CM

## 2017-10-23 DIAGNOSIS — M25512 Pain in left shoulder: Secondary | ICD-10-CM

## 2017-10-23 NOTE — Telephone Encounter (Signed)
Call received from Pt.  Would like to schedule ICD placement for November 08, 2017 at 11:30 am.  Pt scheduled.  Instruction/letter soap at front desk for pick up tomorrow when Pt gets lab work.
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
## 2017-10-24 ENCOUNTER — Other Ambulatory Visit: Payer: BLUE CROSS/BLUE SHIELD | Admitting: *Deleted

## 2017-10-24 DIAGNOSIS — I5022 Chronic systolic (congestive) heart failure: Secondary | ICD-10-CM

## 2017-10-25 LAB — CBC WITH DIFFERENTIAL/PLATELET
Basophils Absolute: 0.1 10*3/uL (ref 0.0–0.2)
Basos: 1 %
EOS (ABSOLUTE): 0.2 10*3/uL (ref 0.0–0.4)
Eos: 2 %
HEMOGLOBIN: 11.5 g/dL (ref 11.1–15.9)
Hematocrit: 35.2 % (ref 34.0–46.6)
Immature Grans (Abs): 0 10*3/uL (ref 0.0–0.1)
Immature Granulocytes: 0 %
LYMPHS ABS: 3.2 10*3/uL — AB (ref 0.7–3.1)
LYMPHS: 38 %
MCH: 28.6 pg (ref 26.6–33.0)
MCHC: 32.7 g/dL (ref 31.5–35.7)
MCV: 88 fL (ref 79–97)
MONOCYTES: 11 %
Monocytes Absolute: 0.9 10*3/uL (ref 0.1–0.9)
NEUTROS ABS: 4 10*3/uL (ref 1.4–7.0)
Neutrophils: 48 %
Platelets: 377 10*3/uL (ref 150–450)
RBC: 4.02 x10E6/uL (ref 3.77–5.28)
RDW: 14.7 % (ref 12.3–15.4)
WBC: 8.4 10*3/uL (ref 3.4–10.8)

## 2017-10-25 LAB — BASIC METABOLIC PANEL
BUN/Creatinine Ratio: 24 (ref 12–28)
BUN: 18 mg/dL (ref 8–27)
CALCIUM: 9.4 mg/dL (ref 8.7–10.3)
CHLORIDE: 102 mmol/L (ref 96–106)
CO2: 23 mmol/L (ref 20–29)
Creatinine, Ser: 0.75 mg/dL (ref 0.57–1.00)
GFR calc Af Amer: 99 mL/min/{1.73_m2} (ref >59)
GFR calc non Af Amer: 86 mL/min/{1.73_m2} (ref >59)
GLUCOSE: 77 mg/dL (ref 65–99)
POTASSIUM: 4.2 mmol/L (ref 3.5–5.2)
Sodium: 137 mmol/L (ref 134–144)

## 2017-10-26 ENCOUNTER — Ambulatory Visit: Payer: BLUE CROSS/BLUE SHIELD

## 2017-10-30 ENCOUNTER — Ambulatory Visit: Payer: BLUE CROSS/BLUE SHIELD | Admitting: Family Medicine

## 2017-10-30 ENCOUNTER — Encounter: Payer: Self-pay | Admitting: Family Medicine

## 2017-10-30 VITALS — BP 114/62 | HR 53 | Temp 97.7°F | Ht 62.0 in | Wt 130.0 lb

## 2017-10-30 DIAGNOSIS — E538 Deficiency of other specified B group vitamins: Secondary | ICD-10-CM

## 2017-10-30 DIAGNOSIS — I10 Essential (primary) hypertension: Secondary | ICD-10-CM | POA: Diagnosis not present

## 2017-10-30 DIAGNOSIS — F321 Major depressive disorder, single episode, moderate: Secondary | ICD-10-CM | POA: Diagnosis not present

## 2017-10-30 MED ORDER — CITALOPRAM HYDROBROMIDE 40 MG PO TABS: 40.0000 mg | ORAL_TABLET | Freq: Every day | ORAL | 3 refills | Status: DC

## 2017-10-30 MED ORDER — CYANOCOBALAMIN 1000 MCG/ML IJ SOLN
1000.0000 ug | Freq: Once | INTRAMUSCULAR | Status: AC
  Administered 2017-10-30: 1000 ug via INTRAMUSCULAR

## 2017-10-30 NOTE — Assessment & Plan Note (Signed)
At goal.  Continue Coreg and Entresto.

## 2017-10-30 NOTE — Assessment & Plan Note (Signed)
We will give B12 injection today.  She will return next month for her next injection.  Will need recheck in 3 to 6 months.

## 2017-10-30 NOTE — Assessment & Plan Note (Signed)
Doing well on Celexa 40 mg daily.  Will refill this prescription today.  Follow-up in 3 to 6 months.

## 2017-10-30 NOTE — Progress Notes (Signed)
   Subjective:  Anita Diaz is a 62 y.o. female who presents today with a chief complaint of depression follow up.   HPI:  Depression, chronic problems, stable Patient has been on celexa 40mg  daily for the past few months.  Reports that this helped significantly with her depressive symptoms.  No notable side effects.  Depression screen [facility] 2/9 10/30/2017  Decreased Interest 0  Down, Depressed, Hopeless 0  PHQ - 2 Score 0  Altered sleeping 3  Tired, decreased energy 3  Change in appetite 1  Feeling bad or failure about yourself  0  Trouble concentrating 0  Moving slowly or fidgety/restless 0  Suicidal thoughts 0  PHQ-9 Score 7  Difficult doing work/chores Not difficult at all   ROS: No SI or HI.  Essential hypertension/HFrEF, Chronic problems, stable Currently on Coreg 6.25 mg twice daily, Entresto 24-26 twice daily, and Lasix 20 mg daily.  Tolerate solids well without side effects.  No reported chest pain or shortness of breath.  She will be having ICD placement later this month.  B12 Deficiency, Chronic problem, stable Currently getting monthly B12 replacement injections as office.  Tolerating well.  ROS: Per HPI  PMH: She reports that she quit smoking about 9 years ago. She has a 74.00 pack-year smoking history. She has never used smokeless tobacco. She reports that she drinks alcohol. She reports that she does not use drugs.  Objective:  Physical Exam: BP 114/62 (BP Location: Left Arm, Patient Position: Sitting, Cuff Size: Normal)   Pulse (!) 53   Temp 97.7 F (36.5 C) (Oral)   Ht 5\' 2"  (1.575 m)   Wt 130 lb (59 kg)   SpO2 99%   BMI 23.78 kg/m   Gen: NAD, resting comfortably CV: RRR with no murmurs appreciated Pulm: NWOB, CTAB with no crackles, wheezes, or rhonchi MSK: No edema, cyanosis, or clubbing noted  Assessment/Plan:  Hypertension At goal.  Continue Coreg and Entresto.  Depression, major, single episode, moderate (HCC) Doing well on Celexa 40 mg  daily.  Will refill this prescription today.  Follow-up in 3 to 6 months.  B12 deficiency We will give B12 injection today.  She will return next month for her next injection.  Will need recheck in 3 to 6 months.  Preventative health care Advised patient she was due for mammogram and Pap smear.  [name]. [name], MD 10/30/2017 8:26 AM

## 2017-10-30 NOTE — Patient Instructions (Signed)
It was very nice to see you today!  I am glad that you are doing well!  I will send in a new prescription for your Celexa.  This will be a 40 mg tablet.  Please take 1 tablet daily.  No other medication changes today.  We will give your B12 shot today.  Please come back in 1 month for your next B12 shot.  I would like to see you back for follow-up visit in 3 to 6 months, or sooner as needed.  Take care, Dr [name]

## 2017-10-31 ENCOUNTER — Encounter: Payer: BLUE CROSS/BLUE SHIELD | Admitting: Physical Therapy

## 2017-10-31 ENCOUNTER — Telehealth: Payer: Self-pay | Admitting: Internal Medicine

## 2017-10-31 NOTE — Telephone Encounter (Signed)
New Message   Pt is calling with questions about her procedure

## 2017-10-31 NOTE — Telephone Encounter (Signed)
Pt calls today with questions regarding which medication she needs to hold the morning of her procedure. Per her pre procedure letter, she should hold her Lasix the morning of her procedure. All remaining medications can be taken with a sip of water.   Pt also has some c/o SOB while at work at the [employer]. She states it is very hot at work. I advised her to be sure she takes breaks and is remaining hydrated without exacerbating her CHF symptoms. She states she only has another 2 shifts before she is out for 8 weeks for her procedure. I advised her to discuss this further with Dr Ladona Ridgel before she returns to work after her procedure.  She agrees with plan, verbalizes understanding and has no additional questions.

## 2017-11-05 ENCOUNTER — Telehealth: Payer: Self-pay

## 2017-11-05 NOTE — Telephone Encounter (Signed)
Call placed to Pt. Pt to start disability process.  Advised Pt after she meets with disability they will request certain information from [place], and we will address at that time.    Pt indicates understanding.  No further action needed at this time.

## 2017-11-06 ENCOUNTER — Ambulatory Visit: Payer: BLUE CROSS/BLUE SHIELD | Admitting: Sports Medicine

## 2017-11-08 ENCOUNTER — Ambulatory Visit (HOSPITAL_COMMUNITY)
Admission: RE | Admit: 2017-11-08 | Discharge: 2017-11-09 | Disposition: A | Discharge status: Home / Self Care | Payer: BLUE CROSS/BLUE SHIELD | Source: Ambulatory Visit | Attending: Internal Medicine | Admitting: Internal Medicine

## 2017-11-08 ENCOUNTER — Other Ambulatory Visit: Payer: Self-pay

## 2017-11-08 ENCOUNTER — Encounter (HOSPITAL_COMMUNITY): Payer: Self-pay | Admitting: *Deleted

## 2017-11-08 ENCOUNTER — Encounter (HOSPITAL_COMMUNITY)
Admission: RE | Disposition: A | Discharge status: Home / Self Care | Payer: Self-pay | Source: Ambulatory Visit | Attending: Internal Medicine

## 2017-11-08 DIAGNOSIS — G4700 Insomnia, unspecified: Secondary | ICD-10-CM | POA: Insufficient documentation

## 2017-11-08 DIAGNOSIS — F319 Bipolar disorder, unspecified: Secondary | ICD-10-CM | POA: Diagnosis not present

## 2017-11-08 DIAGNOSIS — I428 Other cardiomyopathies: Secondary | ICD-10-CM | POA: Diagnosis not present

## 2017-11-08 DIAGNOSIS — Z87891 Personal history of nicotine dependence: Secondary | ICD-10-CM | POA: Insufficient documentation

## 2017-11-08 DIAGNOSIS — E785 Hyperlipidemia, unspecified: Secondary | ICD-10-CM | POA: Diagnosis not present

## 2017-11-08 DIAGNOSIS — I447 Left bundle-branch block, unspecified: Secondary | ICD-10-CM | POA: Diagnosis not present

## 2017-11-08 DIAGNOSIS — Z882 Allergy status to sulfonamides status: Secondary | ICD-10-CM | POA: Insufficient documentation

## 2017-11-08 DIAGNOSIS — I5022 Chronic systolic (congestive) heart failure: Secondary | ICD-10-CM | POA: Diagnosis not present

## 2017-11-08 DIAGNOSIS — M19049 Primary osteoarthritis, unspecified hand: Secondary | ICD-10-CM | POA: Diagnosis not present

## 2017-11-08 DIAGNOSIS — I2510 Atherosclerotic heart disease of native coronary artery without angina pectoris: Secondary | ICD-10-CM | POA: Insufficient documentation

## 2017-11-08 DIAGNOSIS — I110 Hypertensive heart disease with heart failure: Secondary | ICD-10-CM | POA: Diagnosis not present

## 2017-11-08 DIAGNOSIS — Z950 Presence of cardiac pacemaker: Secondary | ICD-10-CM

## 2017-11-08 DIAGNOSIS — Z95810 Presence of automatic (implantable) cardiac defibrillator: Secondary | ICD-10-CM

## 2017-11-08 DIAGNOSIS — G8929 Other chronic pain: Secondary | ICD-10-CM | POA: Insufficient documentation

## 2017-11-08 DIAGNOSIS — Z7982 Long term (current) use of aspirin: Secondary | ICD-10-CM | POA: Insufficient documentation

## 2017-11-08 DIAGNOSIS — I5020 Unspecified systolic (congestive) heart failure: Secondary | ICD-10-CM | POA: Diagnosis present

## 2017-11-08 HISTORY — PX: BIV ICD INSERTION CRT-D: EP1195

## 2017-11-08 HISTORY — DX: Presence of cardiac pacemaker: Z95.0

## 2017-11-08 LAB — SURGICAL PCR SCREEN
MRSA, PCR: NEGATIVE
STAPHYLOCOCCUS AUREUS: NEGATIVE

## 2017-11-08 LAB — MAGNESIUM: MAGNESIUM: 2.1 mg/dL (ref 1.7–2.4)

## 2017-11-08 LAB — BASIC METABOLIC PANEL
Anion gap: 9 (ref 5–15)
BUN: 23 mg/dL (ref 8–23)
CHLORIDE: 103 mmol/L (ref 98–111)
CO2: 24 mmol/L (ref 22–32)
Calcium: 8.3 mg/dL — ABNORMAL LOW (ref 8.9–10.3)
Creatinine, Ser: 0.82 mg/dL (ref 0.44–1.00)
GFR calc non Af Amer: >60 mL/min (ref >60)
Glucose, Bld: 127 mg/dL — ABNORMAL HIGH (ref 70–99)
POTASSIUM: 4.1 mmol/L (ref 3.5–5.1)
SODIUM: 136 mmol/L (ref 135–145)

## 2017-11-08 SURGERY — BIV ICD INSERTION CRT-D

## 2017-11-08 MED ORDER — MUPIROCIN 2 % EX OINT
TOPICAL_OINTMENT | CUTANEOUS | Status: AC
  Administered 2017-11-08: 10:00:00
  Filled 2017-11-08: qty 22

## 2017-11-08 MED ORDER — FUROSEMIDE 20 MG PO TABS
20.0000 mg | ORAL_TABLET | Freq: Every day | ORAL | Status: DC
  Administered 2017-11-09: 20 mg via ORAL
  Filled 2017-11-08: qty 1

## 2017-11-08 MED ORDER — LIDOCAINE HCL 1 % IJ SOLN
INTRAMUSCULAR | Status: AC
  Filled 2017-11-08: qty 20

## 2017-11-08 MED ORDER — LEVOCETIRIZINE DIHYDROCHLORIDE 5 MG PO TABS: 5.0000 mg | ORAL_TABLET | Freq: Every evening | ORAL | Status: DC

## 2017-11-08 MED ORDER — LIDOCAINE HCL (PF) 1 % IJ SOLN
INTRAMUSCULAR | Status: DC | PRN
  Administered 2017-11-08: 45 mL

## 2017-11-08 MED ORDER — MIDAZOLAM HCL 5 MG/5ML IJ SOLN
INTRAMUSCULAR | Status: AC
  Filled 2017-11-08: qty 5

## 2017-11-08 MED ORDER — CEFAZOLIN SODIUM-DEXTROSE 2-4 GM/100ML-% IV SOLN
INTRAVENOUS | Status: AC
  Filled 2017-11-08: qty 100

## 2017-11-08 MED ORDER — NITROGLYCERIN 0.4 MG SL SUBL: 0.4000 mg | SUBLINGUAL_TABLET | SUBLINGUAL | Status: DC | PRN

## 2017-11-08 MED ORDER — SODIUM CHLORIDE 0.9 % IV SOLN
INTRAVENOUS | Status: DC
  Administered 2017-11-08: 10:00:00 via INTRAVENOUS

## 2017-11-08 MED ORDER — LORATADINE 10 MG PO TABS
10.0000 mg | ORAL_TABLET | Freq: Every day | ORAL | Status: DC
  Administered 2017-11-08: 10 mg via ORAL
  Filled 2017-11-08: qty 1

## 2017-11-08 MED ORDER — ASPIRIN EC 81 MG PO TBEC
81.0000 mg | DELAYED_RELEASE_TABLET | Freq: Every day | ORAL | Status: DC
  Administered 2017-11-09: 81 mg via ORAL
  Filled 2017-11-08: qty 1

## 2017-11-08 MED ORDER — CITALOPRAM HYDROBROMIDE 20 MG PO TABS
40.0000 mg | ORAL_TABLET | Freq: Every day | ORAL | Status: DC
  Administered 2017-11-09: 40 mg via ORAL
  Filled 2017-11-08: qty 2

## 2017-11-08 MED ORDER — CEFAZOLIN SODIUM-DEXTROSE 1-4 GM/50ML-% IV SOLN
1.0000 g | Freq: Four times a day (QID) | INTRAVENOUS | Status: AC
  Administered 2017-11-08 – 2017-11-09 (×3): 1 g via INTRAVENOUS
  Filled 2017-11-08 (×3): qty 50

## 2017-11-08 MED ORDER — SODIUM CHLORIDE 0.9 % IV SOLN
INTRAVENOUS | Status: AC
  Filled 2017-11-08: qty 2

## 2017-11-08 MED ORDER — IOPAMIDOL (ISOVUE-370) INJECTION 76%
INTRAVENOUS | Status: DC | PRN
  Administered 2017-11-08: 10 mL via INTRAVENOUS
  Administered 2017-11-08: 5 mL

## 2017-11-08 MED ORDER — IOPAMIDOL (ISOVUE-370) INJECTION 76%
INTRAVENOUS | Status: AC
  Filled 2017-11-08: qty 50

## 2017-11-08 MED ORDER — FENTANYL CITRATE (PF) 100 MCG/2ML IJ SOLN
INTRAMUSCULAR | Status: DC | PRN
  Administered 2017-11-08: 12.5 ug via INTRAVENOUS
  Administered 2017-11-08: 25 ug via INTRAVENOUS
  Administered 2017-11-08 (×5): 12.5 ug via INTRAVENOUS

## 2017-11-08 MED ORDER — FENTANYL CITRATE (PF) 100 MCG/2ML IJ SOLN
INTRAMUSCULAR | Status: AC
  Filled 2017-11-08: qty 2

## 2017-11-08 MED ORDER — GABAPENTIN 300 MG PO CAPS
300.0000 mg | ORAL_CAPSULE | Freq: Every day | ORAL | Status: DC
  Administered 2017-11-09: 300 mg via ORAL
  Filled 2017-11-08: qty 1

## 2017-11-08 MED ORDER — ONDANSETRON HCL 4 MG/2ML IJ SOLN: 4.0000 mg | Freq: Four times a day (QID) | INTRAMUSCULAR | Status: DC | PRN

## 2017-11-08 MED ORDER — CARVEDILOL 6.25 MG PO TABS
6.2500 mg | ORAL_TABLET | Freq: Two times a day (BID) | ORAL | Status: DC
  Administered 2017-11-08: 6.25 mg via ORAL
  Filled 2017-11-08 (×2): qty 1

## 2017-11-08 MED ORDER — SODIUM CHLORIDE 0.9 % IV SOLN
80.0000 mg | INTRAVENOUS | Status: AC
  Administered 2017-11-08: 80 mg

## 2017-11-08 MED ORDER — SACUBITRIL-VALSARTAN 24-26 MG PO TABS
1.0000 | ORAL_TABLET | Freq: Two times a day (BID) | ORAL | Status: DC
  Administered 2017-11-08 – 2017-11-09 (×2): 1 via ORAL
  Filled 2017-11-08 (×2): qty 1

## 2017-11-08 MED ORDER — OXYCODONE HCL 5 MG PO TABS
5.0000 mg | ORAL_TABLET | ORAL | Status: DC | PRN
  Administered 2017-11-08 – 2017-11-09 (×2): 5 mg via ORAL
  Filled 2017-11-08 (×2): qty 1

## 2017-11-08 MED ORDER — ACETAMINOPHEN 325 MG PO TABS
325.0000 mg | ORAL_TABLET | ORAL | Status: DC | PRN
  Administered 2017-11-08 – 2017-11-09 (×2): 650 mg via ORAL
  Filled 2017-11-08 (×2): qty 2

## 2017-11-08 MED ORDER — MIDAZOLAM HCL 5 MG/5ML IJ SOLN
INTRAMUSCULAR | Status: DC | PRN
  Administered 2017-11-08 (×10): 1 mg via INTRAVENOUS

## 2017-11-08 MED ORDER — CEFAZOLIN SODIUM-DEXTROSE 2-4 GM/100ML-% IV SOLN
2.0000 g | INTRAVENOUS | Status: AC
  Administered 2017-11-08: 2 g via INTRAVENOUS

## 2017-11-08 MED ORDER — CHLORHEXIDINE GLUCONATE 4 % EX LIQD: 60.0000 mL | Freq: Once | CUTANEOUS | Status: DC

## 2017-11-08 MED ORDER — LOPERAMIDE HCL 2 MG PO CAPS
4.0000 mg | ORAL_CAPSULE | Freq: Every day | ORAL | Status: DC
  Filled 2017-11-08: qty 2

## 2017-11-08 SURGICAL SUPPLY — 20 items
CABLE SURGICAL S-101-97-12 (CABLE) ×2 IMPLANT
CATH ACUITYPRO 45CM H 9F (CATHETERS) ×2 IMPLANT
CATH ACUITYPRO IC-130 7F (CATHETERS) ×2
CATH GD CS-IC 130 CVD 60X7FR (CATHETERS) IMPLANT
CATH HEXAPOLAR DAMATO 6F (CATHETERS) ×1 IMPLANT
ICD VIGILANT DF4 G247 (ICD Generator) ×1 IMPLANT
INGEVITY MRI 7740-45CM (Lead) ×2 IMPLANT
LEAD ACUITY X4 4671 (Lead) ×1 IMPLANT
LEAD PACING INGEVITY MRI 45CM (Lead) IMPLANT
LEAD RELIANCE G DF4 0292 (Lead) ×1 IMPLANT
PAD DEFIB LIFELINK (PAD) ×1 IMPLANT
SHEATH CLASSIC 7F (SHEATH) ×1 IMPLANT
SHEATH CLASSIC 9.5F (SHEATH) ×1 IMPLANT
SHEATH CLASSIC 9F (SHEATH) ×1 IMPLANT
SHEATH WORLEY 9FR 62CM (SHEATH) ×1 IMPLANT
SHIELD RADPAD SCOOP 12X17 (MISCELLANEOUS) ×1 IMPLANT
TRAY PACEMAKER INSERTION (PACKS) ×2 IMPLANT
WIRE ACUITY WHISPER EDS 4648 (WIRE) ×2 IMPLANT
WIRE LUGE 182CM (WIRE) ×1 IMPLANT
WIRE MAILMAN 182CM (WIRE) ×2 IMPLANT

## 2017-11-08 NOTE — Interval H&P Note (Signed)
History and Physical Interval Note:  11/08/2017 10:29 AM  Anita Diaz  has presented today for surgery, with the diagnosis of chronic systolic heart failure, LBBB  The various methods of treatment have been discussed with the patient and family. After consideration of risks, benefits and other options for treatment, the patient has consented to  Procedure(s): BIV ICD INSERTION CRT-D (N/A) as a surgical intervention .  The patient's history has been reviewed, patient examined, no change in status, stable for surgery.  I have reviewed the patient's chart and labs.  Questions were answered to the patient's satisfaction.     Lewayne Bunting

## 2017-11-09 ENCOUNTER — Ambulatory Visit (HOSPITAL_COMMUNITY): Payer: BLUE CROSS/BLUE SHIELD

## 2017-11-09 ENCOUNTER — Encounter (HOSPITAL_COMMUNITY): Payer: Self-pay | Admitting: Internal Medicine

## 2017-11-09 DIAGNOSIS — I110 Hypertensive heart disease with heart failure: Secondary | ICD-10-CM | POA: Diagnosis not present

## 2017-11-09 DIAGNOSIS — M19049 Primary osteoarthritis, unspecified hand: Secondary | ICD-10-CM | POA: Diagnosis not present

## 2017-11-09 DIAGNOSIS — I5022 Chronic systolic (congestive) heart failure: Secondary | ICD-10-CM | POA: Diagnosis not present

## 2017-11-09 DIAGNOSIS — Z882 Allergy status to sulfonamides status: Secondary | ICD-10-CM | POA: Diagnosis not present

## 2017-11-09 DIAGNOSIS — Z87891 Personal history of nicotine dependence: Secondary | ICD-10-CM | POA: Diagnosis not present

## 2017-11-09 DIAGNOSIS — G4700 Insomnia, unspecified: Secondary | ICD-10-CM | POA: Diagnosis not present

## 2017-11-09 DIAGNOSIS — E785 Hyperlipidemia, unspecified: Secondary | ICD-10-CM | POA: Diagnosis not present

## 2017-11-09 DIAGNOSIS — Z7982 Long term (current) use of aspirin: Secondary | ICD-10-CM | POA: Diagnosis not present

## 2017-11-09 DIAGNOSIS — I2510 Atherosclerotic heart disease of native coronary artery without angina pectoris: Secondary | ICD-10-CM | POA: Diagnosis not present

## 2017-11-09 DIAGNOSIS — Z95810 Presence of automatic (implantable) cardiac defibrillator: Secondary | ICD-10-CM | POA: Diagnosis not present

## 2017-11-09 DIAGNOSIS — I428 Other cardiomyopathies: Secondary | ICD-10-CM

## 2017-11-09 DIAGNOSIS — G8929 Other chronic pain: Secondary | ICD-10-CM | POA: Diagnosis not present

## 2017-11-09 DIAGNOSIS — I447 Left bundle-branch block, unspecified: Secondary | ICD-10-CM | POA: Diagnosis not present

## 2017-11-09 DIAGNOSIS — F319 Bipolar disorder, unspecified: Secondary | ICD-10-CM | POA: Diagnosis not present

## 2017-11-09 IMAGING — DX DG CHEST 2V
2 series · 2 of 2 positions shown · non-contrast
Comparison: [DATE]

CLINICAL DATA: Postop defibrillator

EXAM:
CHEST - 2 VIEW

[chest pa]
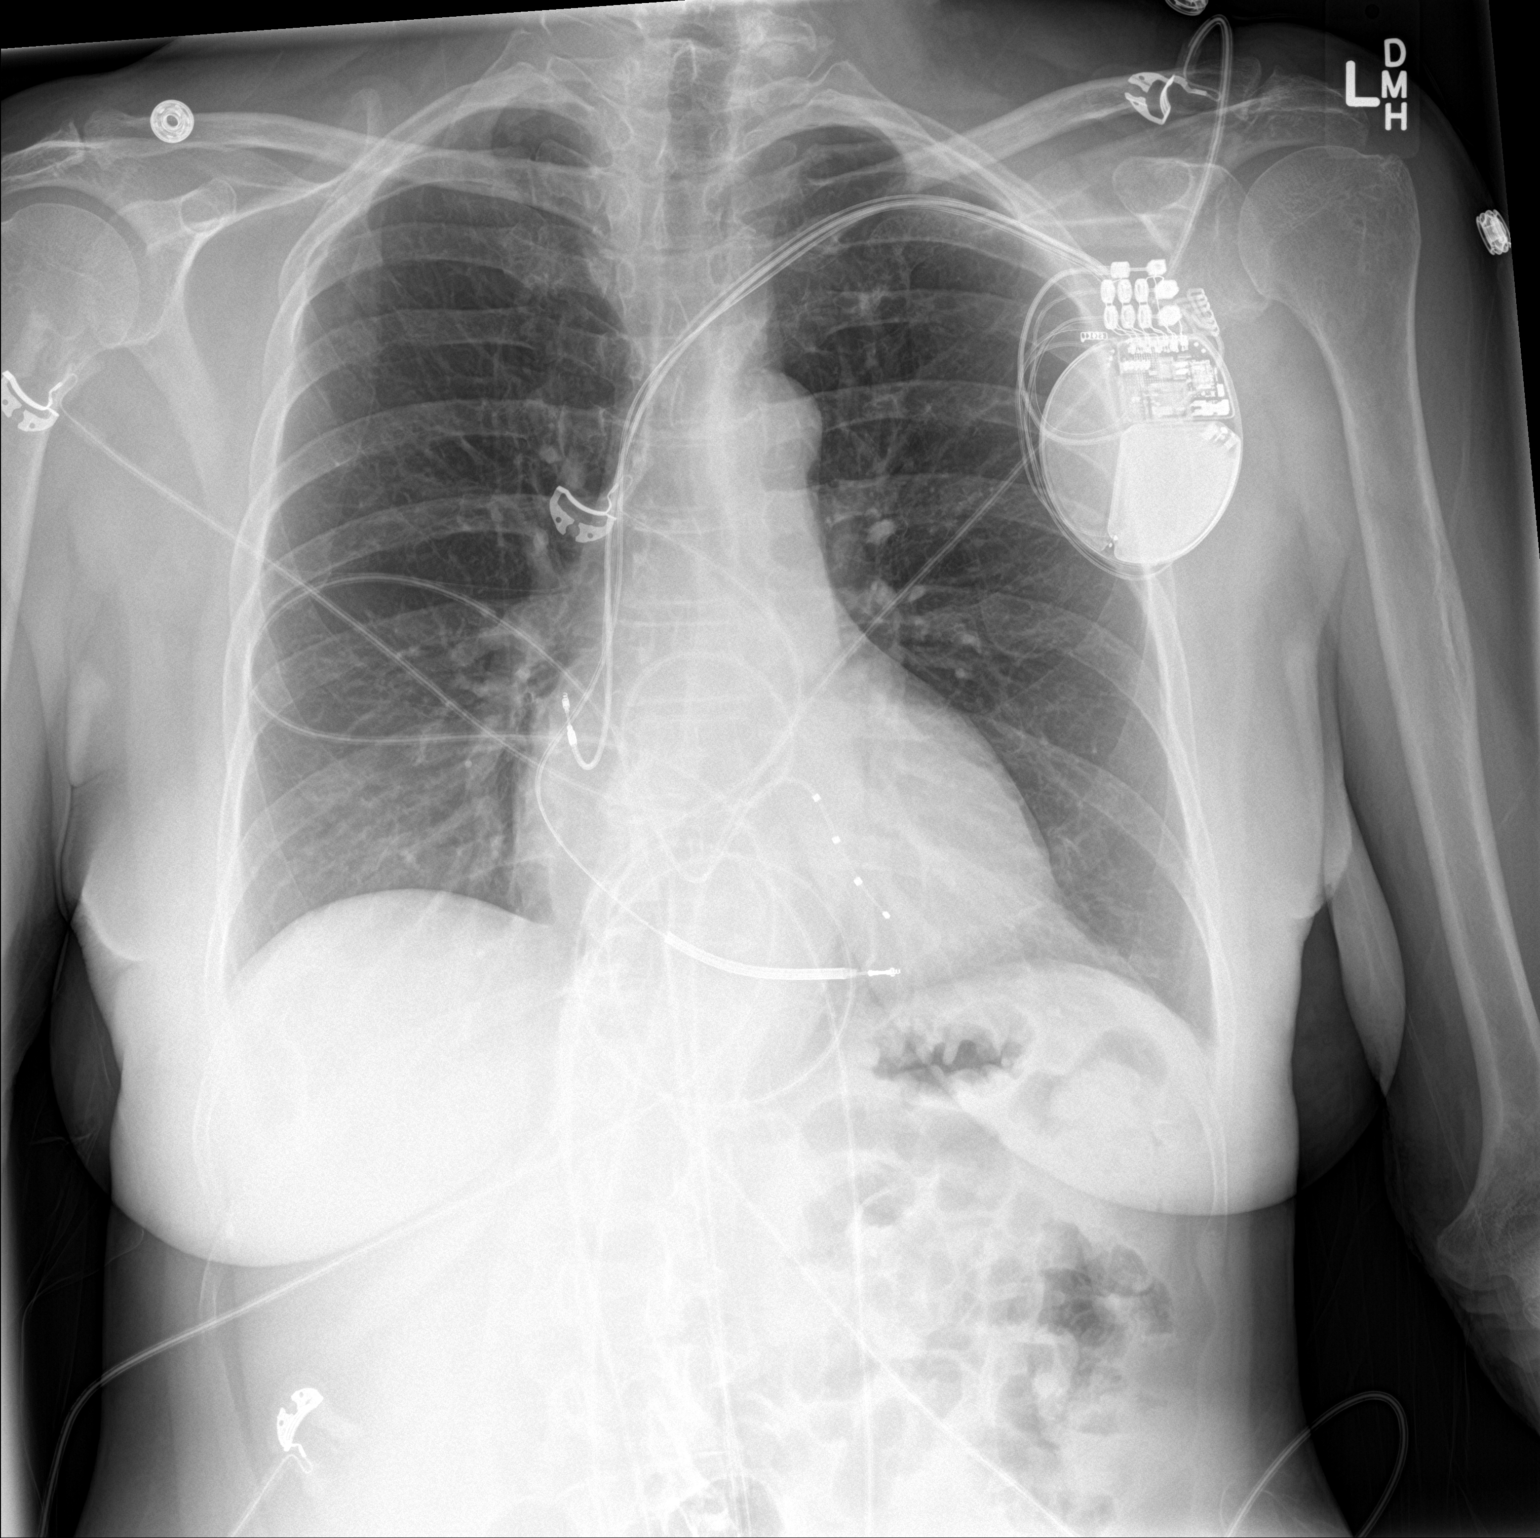

[chest lat]
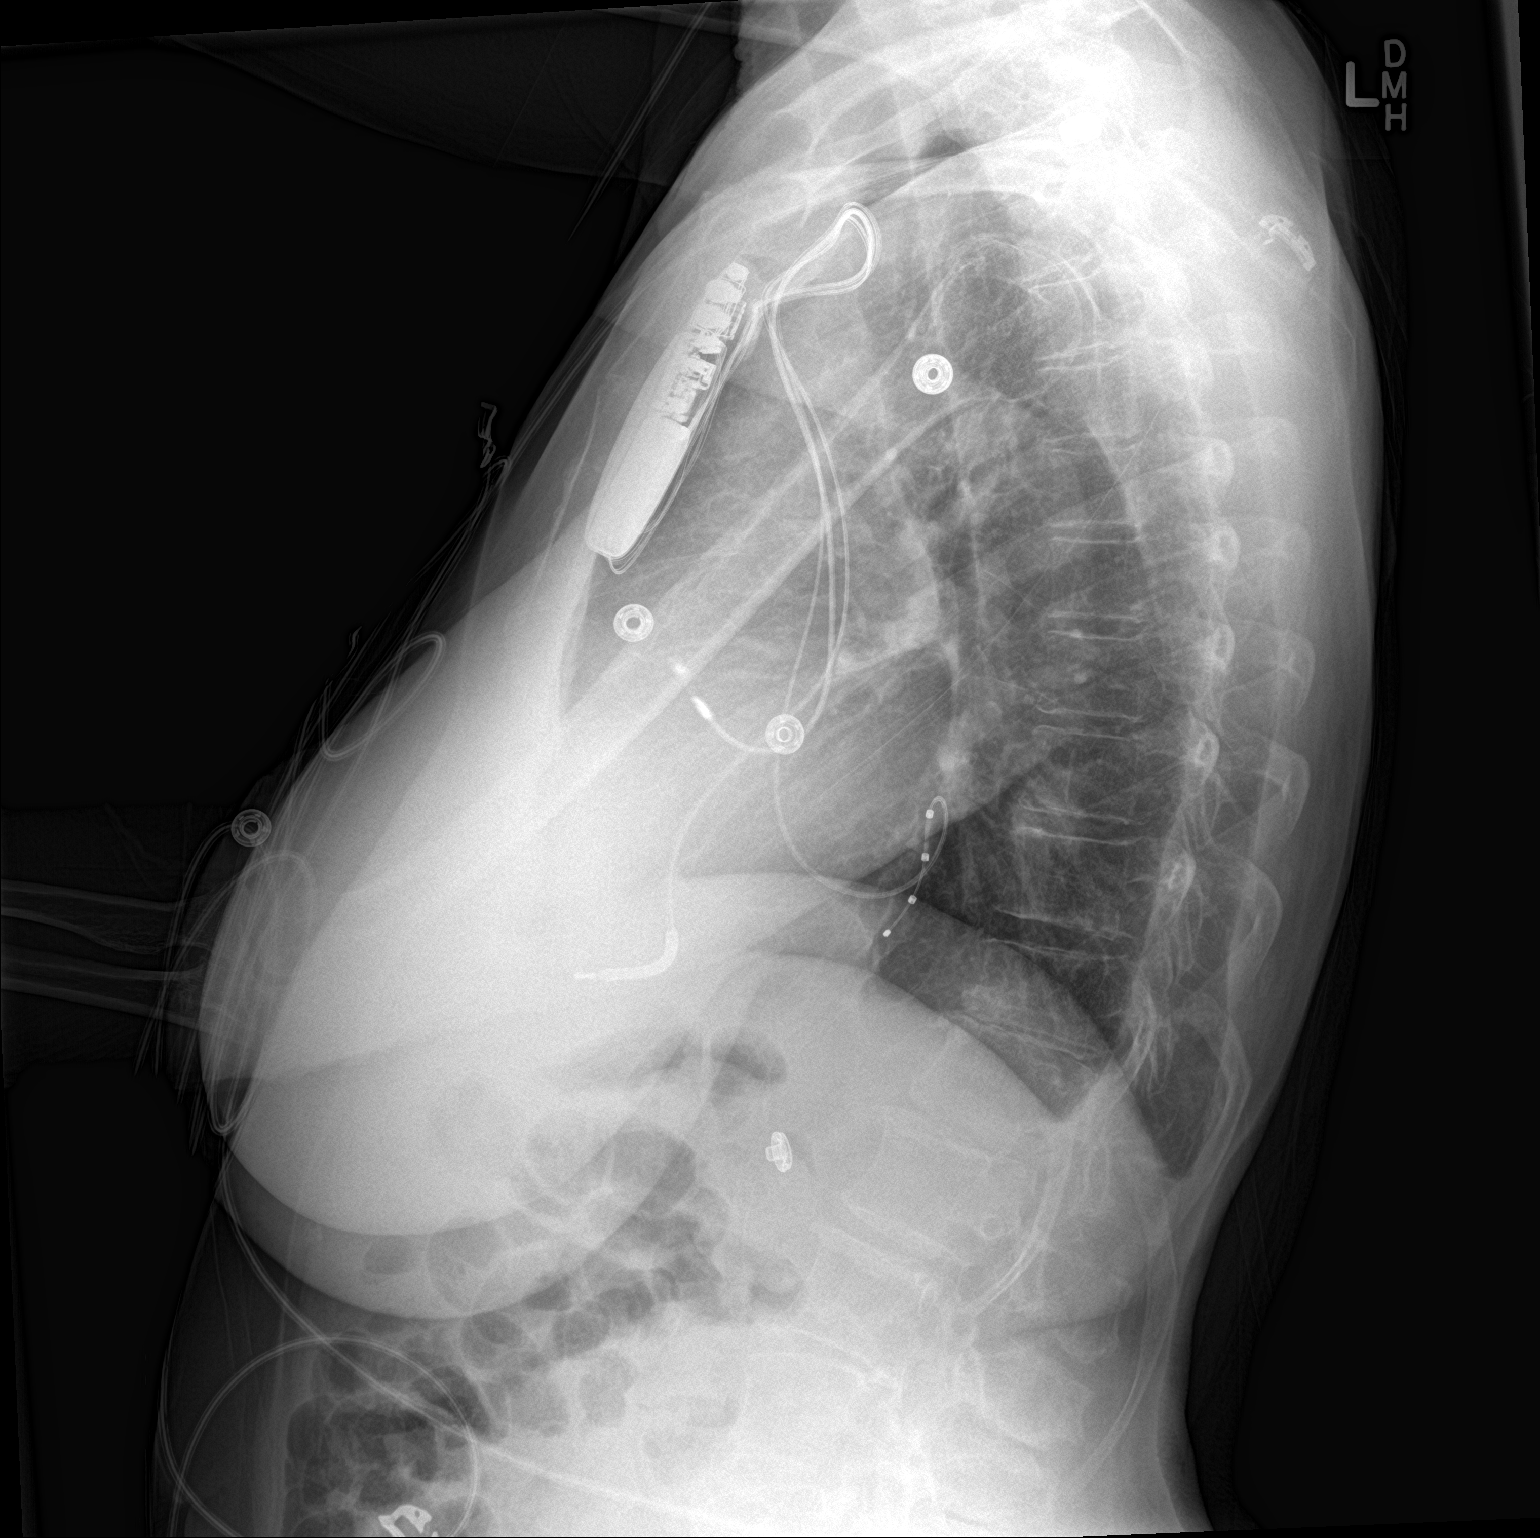

[2 of 2 positions shown; findings below may reference images not displayed]

FINDINGS: Left fibrillator has been placed with leads in the right atrium,
right ventricle and coronary sinus. No pneumothorax. Heart is upper
limits normal in size. No confluent opacities or effusions.
IMPRESSION: Left fibrillator placement.  No pneumothorax.

## 2017-11-09 MED ORDER — CARVEDILOL 12.5 MG PO TABS
12.5000 mg | ORAL_TABLET | Freq: Two times a day (BID) | ORAL | Status: DC
  Administered 2017-11-09: 12.5 mg via ORAL
  Filled 2017-11-09: qty 1

## 2017-11-09 MED ORDER — CARVEDILOL 12.5 MG PO TABS: 12.5000 mg | ORAL_TABLET | Freq: Two times a day (BID) | ORAL | 6 refills | Status: DC

## 2017-11-09 MED ORDER — CARVEDILOL 12.5 MG PO TABS: 12.5000 mg | ORAL_TABLET | Freq: Two times a day (BID) | ORAL | Status: DC

## 2017-11-09 NOTE — Discharge Instructions (Signed)
° ° °  Supplemental Discharge Instructions for  Pacemaker/Defibrillator Patients  Activity No heavy lifting or vigorous activity with your left/right arm for 6 to 8 weeks.  Do not raise your left/right arm above your head for one week.  Gradually raise your affected arm as drawn below.              11/12/17                     11/13/17                    11/14/17                  11/15/17 __  NO DRIVING for  1 week   ; you may begin driving on  9/37/90  .  WOUND CARE - Keep the wound area clean and dry.  Do not get this area wet for one week. No showers for one week; you may shower on  11/15/17  . - The tape/steri-strips on your wound will fall off; do not pull them off.  No bandage is needed on the site.  DO  NOT apply any creams, oils, or ointments to the wound area. - If you notice any drainage or discharge from the wound, any swelling or bruising at the site, or you develop a fever > 101? F after you are discharged home, call the office at once.  Special Instructions - You are still able to use cellular telephones; use the ear opposite the side where you have your pacemaker/defibrillator.  Avoid carrying your cellular phone near your device. - When traveling through airports, show security personnel your identification card to avoid being screened in the metal detectors.  Ask the security personnel to use the hand wand. - Avoid arc welding equipment, MRI testing (magnetic resonance imaging), TENS units (transcutaneous nerve stimulators).  Call the office for questions about other devices. - Avoid electrical appliances that are in poor condition or are not properly grounded. - Microwave ovens are safe to be near or to operate.  Additional information for defibrillator patients should your device go off: - If your device goes off ONCE and you feel fine afterward, notify the device clinic nurses. - If your device goes off ONCE and you do not feel well afterward, call 911. - If your device goes  off TWICE, call 911. - If your device goes off THREE times in one day, call 911.  DO NOT DRIVE YOURSELF OR A FAMILY MEMBER WITH A DEFIBRILLATOR TO THE HOSPITAL--CALL 911.

## 2017-11-09 NOTE — Discharge Summary (Addendum)
ELECTROPHYSIOLOGY PROCEDURE DISCHARGE SUMMARY    Patient ID: Anita Diaz,  MRN: [id number], DOB/AGE: 07-19-1955 62 y.o.  Admit date: 11/08/2017 Discharge date: 11/09/2017  Primary Care Physician: Ardith Dark, MD  Primary Cardiologist: Dr. Anne Fu Electrophysiologist: Dr. Ladona Ridgel  Primary Discharge Diagnosis:  1. NICM 2. LBBB  Secondary Discharge Diagnosis:  1. Chronic CHF (systolic) 2. CAD 3. HTN   Allergies  Allergen Reactions  . Sulfa Antibiotics Other (See Comments)    Childhood allergy     Procedures This Admission:  1.  Implantation of a BSCi CRT-D on 11/08/17 by Dr Ladona Ridgel.  The patient received a [employer] (serial Number [id number]) biventricular ICD, [employer] (serial # [id number] ) right atrial lead and a [employer] (serial number [id number]) right ventricular defibrillator lead, A [employer](serial number [id number]) lead (LV) DFT's were deferred at time of implant.   There were no immediate post procedure complications. 2.  CXR on 11/09/17 demonstrated no pneumothorax status post device implantation.   Brief HPI: Anita Diaz is a 62 y.o. female was referred to electrophysiology in the outpatient setting for consideration of ICD implantation.  Past medical history includes above.  The patient has persistent LV dysfunction despite guideline directed therapy.  Risks, benefits, and alternatives to ICD implantation were reviewed with the patient who wished to proceed.   Hospital Course:  The patient was admitted and underwent implantation of a CRT-D with details as outlined above. She was monitored on telemetry overnight which demonstrated SR/V pacing, occasional PATs.  Left chest was without hematoma or ecchymosis.  The device was interrogated and found to be functioning normally.  CXR was obtained and demonstrated no pneumothorax status post device implantation.  Wound care, arm mobility, and restrictions were reviewed with the patient.  She has had some recurrent PATs,  will up-titrate her coreg. The patient feels well, no CP or SOB, she was examined by Dr. Ladona Ridgel and considered stable for discharge to home.   The patient's discharge medications include an ARB (entresto) and beta blocker (carvedilol).   Physical Exam: Vitals:   11/08/17 2226 11/08/17 2250 11/09/17 0318 11/09/17 0704  BP:   129/67 138/77  Pulse:   (!) 51 (!) 50  Resp:   18 18  Temp: 98.1 F (36.7 C) 97.8 F (36.6 C) 97.7 F (36.5 C) 98.1 F (36.7 C)  TempSrc:  Oral Oral Oral  SpO2:   96% 97%  Weight:      Height:        GEN- The patient is well appearing, alert and oriented x 3 today.   HEENT: normocephalic, atraumatic; sclera clear, conjunctiva pink; hearing intact; oropharynx clear Lungs- CTA b/l, normal work of breathing.  No wheezes, rales, rhonchi Heart- RRR, no murmurs, rubs or gallops, PMI not laterally displaced GI- soft, non-tender, non-distended Extremities- no clubbing, cyanosis, or edema MS- no significant deformity or atrophy Skin- warm and dry, no rash or lesion, left chest without hematoma/ecchymosis Psych- euthymic mood, full affect Neuro- no gross defecits  Labs:   Lab Results  Component Value Date   WBC 8.4 10/24/2017   HGB 11.5 10/24/2017   HCT 35.2 10/24/2017   MCV 88 10/24/2017   PLT 377 10/24/2017    Recent Labs  Lab 11/08/17 2156  NA 136  K 4.1  CL 103  CO2 24  BUN 23  CREATININE 0.82  CALCIUM 8.3*  GLUCOSE 127*    Discharge Medications:  Allergies as of 11/09/2017  Reactions   Sulfa Antibiotics Other (See Comments)   Childhood allergy      Medication List    TAKE these medications   aspirin EC 81 MG tablet Take 1 tablet (81 mg total) by mouth daily.   carvedilol 12.5 MG tablet Commonly known as:  COREG Take 1 tablet (12.5 mg total) by mouth 2 (two) times daily with a meal. What changed:    medication strength  how much to take   citalopram 40 MG tablet Commonly known as:  CELEXA Take 1 tablet (40 mg total) by  mouth daily.   diclofenac sodium 1 % Gel Commonly known as:  VOLTAREN Apply topically as needed.   furosemide 20 MG tablet Commonly known as:  LASIX Take 1 tablet (20 mg total) by mouth daily.   gabapentin 300 MG capsule Commonly known as:  NEURONTIN Take 300 mg by mouth daily.   levocetirizine 5 MG tablet Commonly known as:  XYZAL Take 5 mg by mouth every evening.   loperamide 2 MG tablet Commonly known as:  IMODIUM A-D Take 4 mg by mouth daily.   nitroGLYCERIN 0.4 MG SL tablet Commonly known as:  NITROSTAT Place 1 tablet (0.4 mg total) under the tongue every 5 (five) minutes as needed for chest pain.   sacubitril-valsartan 24-26 MG Commonly known as:  ENTRESTO Take 1 tablet by mouth 2 (two) times daily.       Disposition:  Home  Discharge Instructions    Diet - low sodium heart healthy   Complete by:  As directed    Increase activity slowly   Complete by:  As directed      Follow-up Information    [facility] Office Follow up on 11/21/2017.   Specialty:  Cardiology Why:  9:00AM, wound check visit Contact information: [address], [address] [place] [address] [phone]       Marinus Maw, MD Follow up on 02/13/2018.   Specialty:  Cardiology Why:  9:15AM Contact information: [address] [place] [address] [phone]           Duration of Discharge Encounter: Greater than 30 minutes including physician time.  Norma Fredrickson, PA-C 11/09/2017 9:44 AM  EP Attending  Patient seen and examined. Agree with above. The patient is doing well after Biv ICD insertion. Her CXR and ICD interogation under my direct supervision are satisfactory. She has had some atrial tachycardia. She will be discharged home with usual followup.  Leonia Reeves.D.

## 2017-11-21 ENCOUNTER — Ambulatory Visit (INDEPENDENT_AMBULATORY_CARE_PROVIDER_SITE_OTHER): Payer: BLUE CROSS/BLUE SHIELD | Admitting: *Deleted

## 2017-11-21 DIAGNOSIS — I428 Other cardiomyopathies: Secondary | ICD-10-CM | POA: Diagnosis not present

## 2017-11-21 DIAGNOSIS — Z95810 Presence of automatic (implantable) cardiac defibrillator: Secondary | ICD-10-CM | POA: Diagnosis not present

## 2017-11-21 DIAGNOSIS — I5022 Chronic systolic (congestive) heart failure: Secondary | ICD-10-CM | POA: Diagnosis not present

## 2017-11-21 LAB — CUP PACEART INCLINIC DEVICE CHECK
Date Time Interrogation Session: 20190731040000
HighPow Impedance: 59 Ohm
Implantable Lead Implant Date: 20190718
Implantable Lead Model: 292
Implantable Lead Model: 7740
Implantable Lead Serial Number: 815914
Implantable Pulse Generator Implant Date: 20190718
Lead Channel Impedance Value: 431 Ohm
Lead Channel Impedance Value: 458 Ohm
Lead Channel Impedance Value: 540 Ohm
Lead Channel Pacing Threshold Amplitude: 1.2 V
Lead Channel Pacing Threshold Amplitude: 1.8 V
Lead Channel Pacing Threshold Pulse Width: 0.4 ms
Lead Channel Pacing Threshold Pulse Width: 0.4 ms
Lead Channel Sensing Intrinsic Amplitude: 19.6 mV
Lead Channel Sensing Intrinsic Amplitude: 19.8 mV
Lead Channel Setting Pacing Amplitude: 3.5 V
Lead Channel Setting Pacing Amplitude: 3.5 V
Lead Channel Setting Sensing Sensitivity: 0.5 mV
Lead Channel Setting Sensing Sensitivity: 1 mV
MDC IDC LEAD IMPLANT DT: 20190718
MDC IDC LEAD IMPLANT DT: 20190718
MDC IDC LEAD LOCATION: 753858
MDC IDC LEAD LOCATION: 753859
MDC IDC LEAD LOCATION: 753860
MDC IDC LEAD SERIAL: 447121
MDC IDC LEAD SERIAL: 694576
MDC IDC MSMT LEADCHNL LV PACING THRESHOLD PULSEWIDTH: 0.4 ms
MDC IDC MSMT LEADCHNL RA PACING THRESHOLD AMPLITUDE: 0.9 V
MDC IDC MSMT LEADCHNL RA SENSING INTR AMPL: 2.5 mV
MDC IDC PG SERIAL: 215046
MDC IDC SET LEADCHNL LV PACING PULSEWIDTH: 0.4 ms
MDC IDC SET LEADCHNL RV PACING AMPLITUDE: 3.5 V
MDC IDC SET LEADCHNL RV PACING PULSEWIDTH: 0.4 ms
MDC IDC STAT BRADY RA PERCENT PACED: 3 %
MDC IDC STAT BRADY RV PERCENT PACED: 1 % — AB

## 2017-11-21 NOTE — Progress Notes (Signed)
Wound check appointment. Steri-strips removed. Wound without redness or edema. Incision edges approximated, wound well healed. Normal device function. Thresholds, sensing, and impedances consistent with implant measurements. Device programmed at 3.5V for extra safety margin until 3 month visit. Histogram distribution indicates some binning in the 130s-150s, consistent with recorded 1:1 conducted AT episodes. Sleep incline calibrated today. No mode switches noted. 116 nonsustained episodes (no EGMs) and 115 VT-1 monitor zone episodes--all available EGMs show 1:1 conducted AT. Patient reports she can feel short periods of heart racing, but she has no other associated symptoms, taking carvedilol and Entresto as instructed. PMT also noted and reproducible, V-A measures , max PVARP extended to , min PVARP extended to . Patient educated about wound care, arm mobility, lifting restrictions, shock plan, and Latitude monitor. ROV with GT on 02/13/18.

## 2017-11-27 ENCOUNTER — Telehealth: Payer: Self-pay | Admitting: Internal Medicine

## 2017-11-27 NOTE — Telephone Encounter (Signed)
Spoke with pt informed her that to make sure that any swimming pool she gets in to check and make sure it is grounded, pt voiced understanding. Also informed pt to take tylenol before bed for shoulder pain pt voiced understanding.

## 2017-11-27 NOTE — Telephone Encounter (Signed)
New message    Patient calling with concerns of shoulder pain. Also has questions regarding "swimming". Advised patient to contact PCP; patient declined. Requesting to speak with [facility] nurse. Please call

## 2017-12-14 ENCOUNTER — Telehealth: Payer: Self-pay

## 2017-12-14 NOTE — Telephone Encounter (Signed)
Pt was implanted 5 weeks ago. Pt states she sometimes feels a heaviness in her chest. Pt also states she feel like the device is racing.

## 2017-12-14 NOTE — Telephone Encounter (Signed)
Spoke with pt informed her that a "heaviness" in her chest is not a usual symptom of having a device, pt stated that it kind of comes of goes pt stated that she didn't know it it was related to her cleaning, pt also stated that she has had some palpations informed pt that I did see some episodes of where her heart was racing verified that pt was taking Coreg 12.5mg  BID, informed pt that I would talk to Dr. Ladona Ridgel regarding these episodes pt also asked abut her home monitor informed her that we had given her the number to technical services and this was her next step. Pt voiced understanding. Also informed pt that she is not to lift anything over ten pounds with affected arm until 6 weeks s/p procedure informed pt that she did not need to move up her apt with Norma Fredrickson prior to the restrictions of 10lbs being lifted.

## 2017-12-17 ENCOUNTER — Telehealth: Payer: Self-pay | Admitting: Internal Medicine

## 2017-12-17 ENCOUNTER — Ambulatory Visit
Admission: RE | Admit: 2017-12-17 | Discharge: 2017-12-17 | Disposition: A | Discharge status: Home / Self Care | Payer: BLUE CROSS/BLUE SHIELD | Source: Ambulatory Visit | Attending: Internal Medicine | Admitting: Internal Medicine

## 2017-12-17 DIAGNOSIS — I5020 Unspecified systolic (congestive) heart failure: Secondary | ICD-10-CM

## 2017-12-17 DIAGNOSIS — R0602 Shortness of breath: Secondary | ICD-10-CM | POA: Diagnosis not present

## 2017-12-17 IMAGING — DX DG CHEST 2V
2 series · 2 of 2 positions shown · non-contrast
Comparison: Chest x-ray of [DATE]

CLINICAL DATA: SOB/ Check new ICD leads

EXAM:
CHEST - 2 VIEW

[dg chest 2 view (1 of 2)]
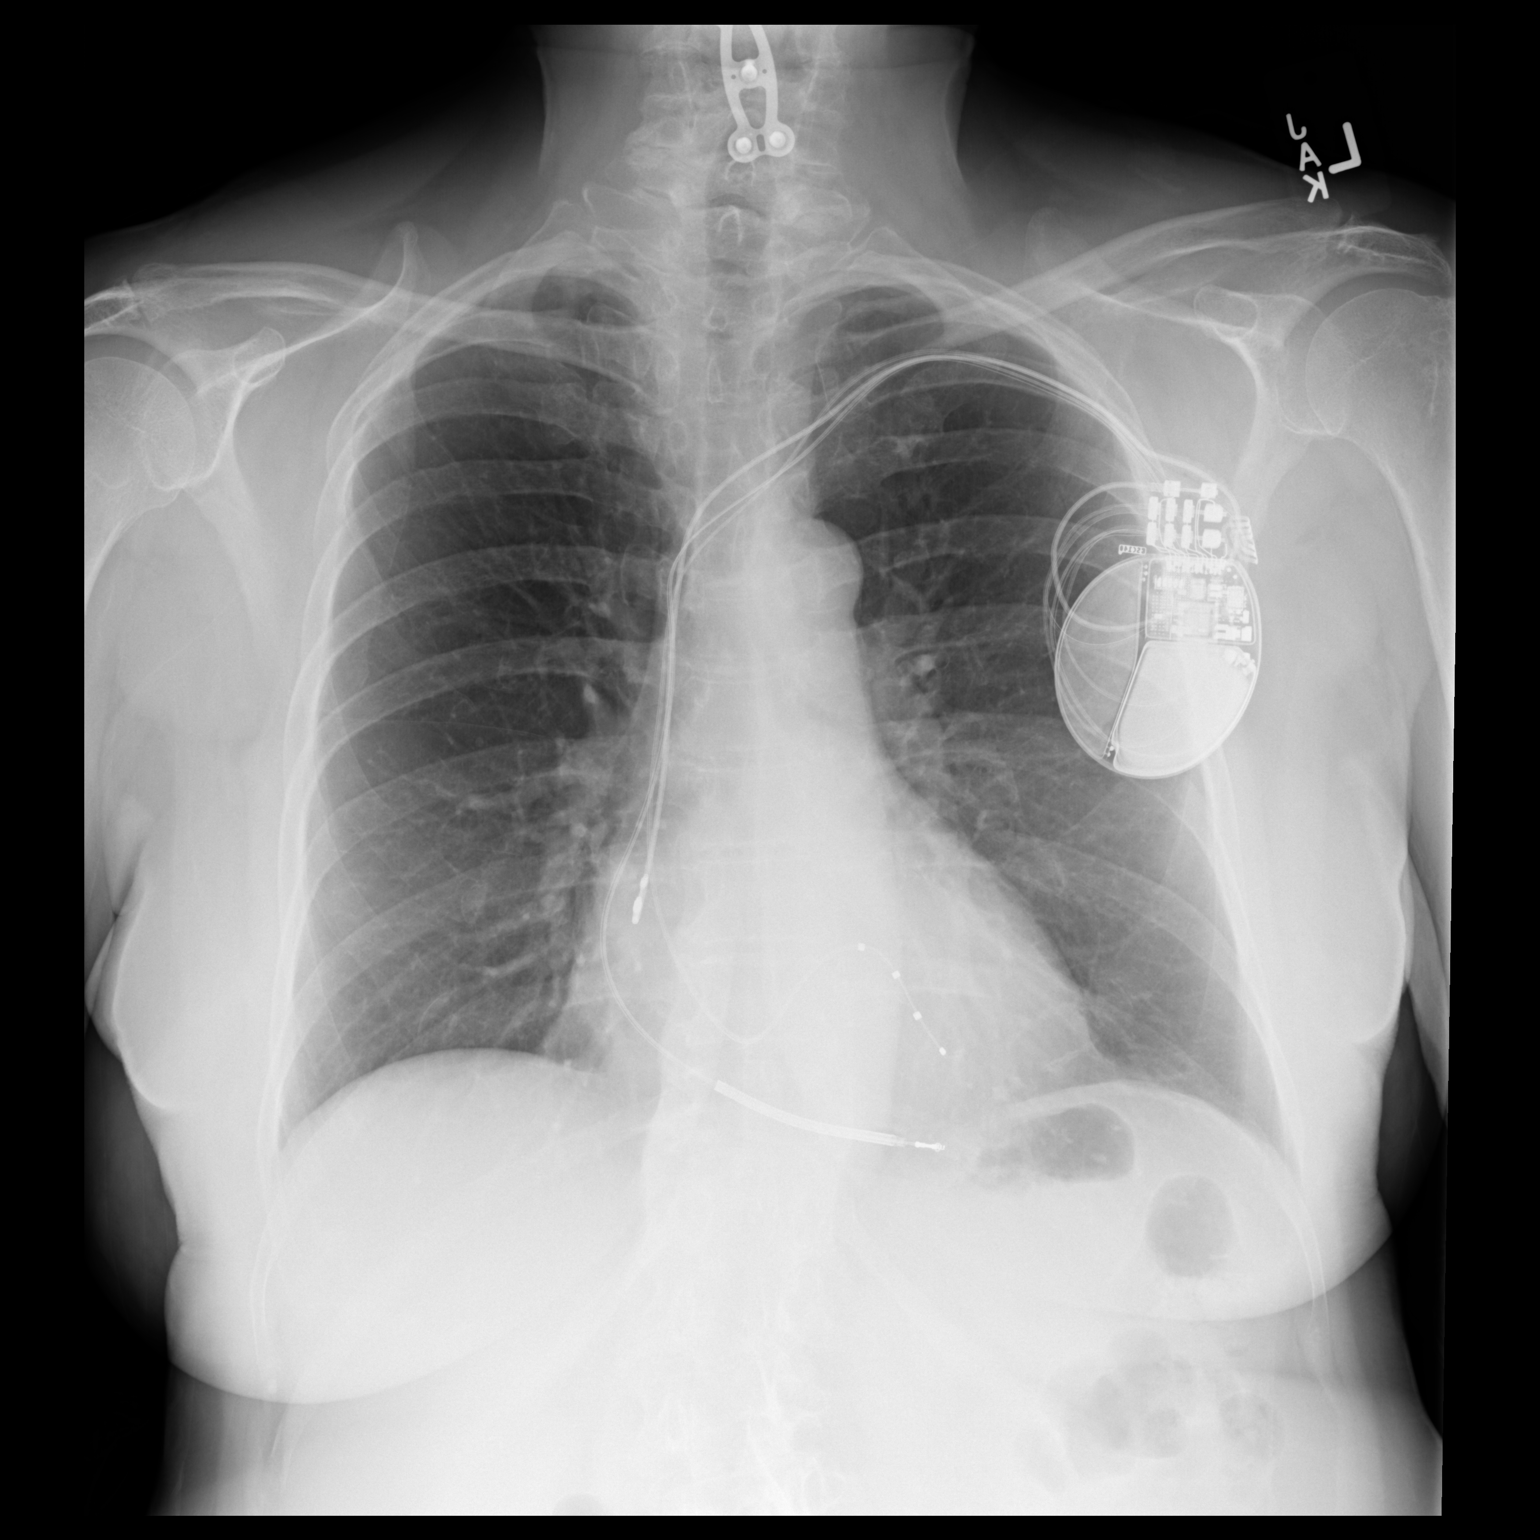

[dg chest 2 view (2 of 2)]
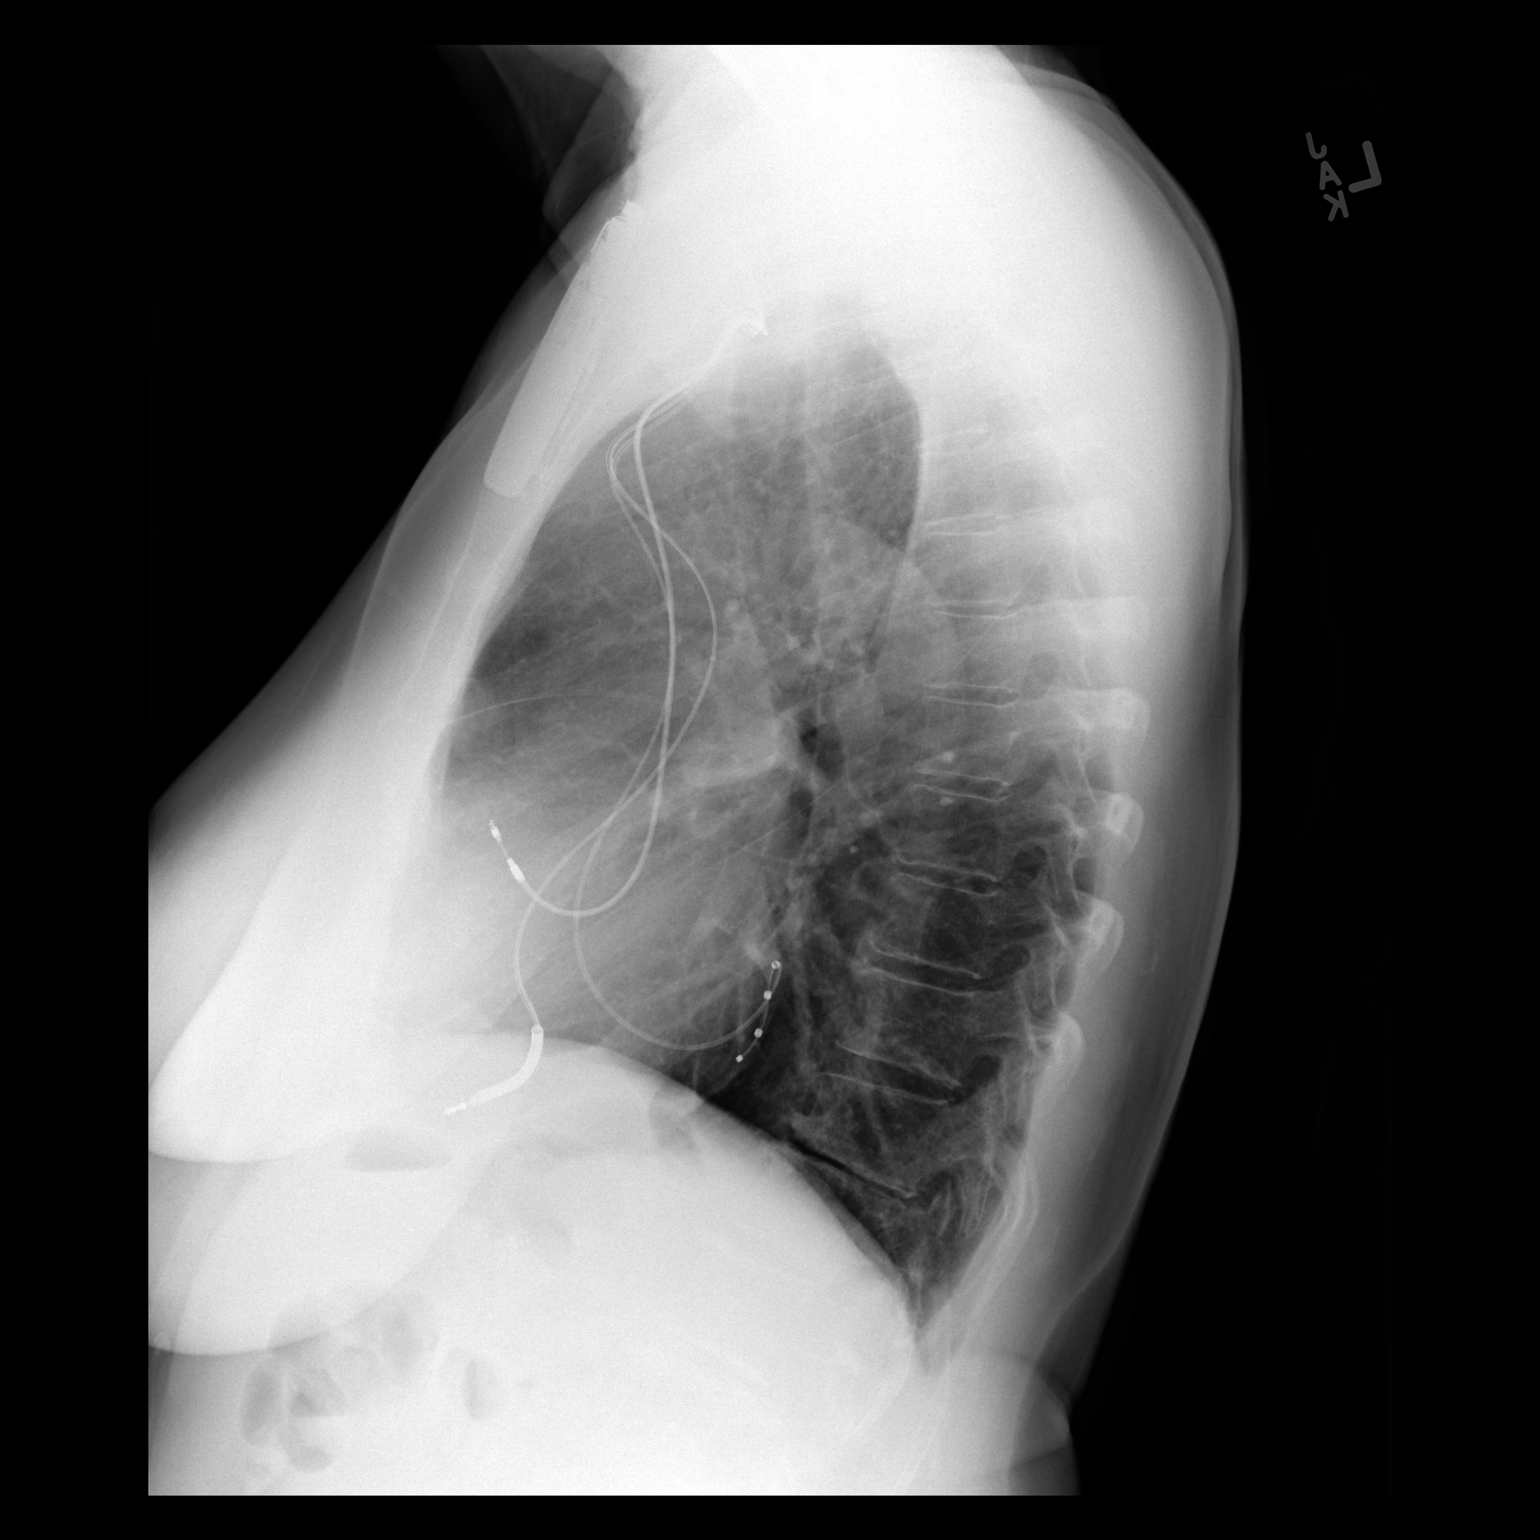

[2 of 2 positions shown; findings below may reference images not displayed]

FINDINGS: The lungs are well-expanded and clear. The heart and mediastinal
structures are normal. The ICD leads are in reasonable position
radiographically. They do not appear appreciably different than on
the study [DATE]. The bony thorax is unremarkable.
IMPRESSION: There is no active cardiopulmonary disease.

## 2017-12-17 NOTE — Telephone Encounter (Signed)
Spoke with pt regarding heart racing informed her that I spoke with Dr. Ladona Ridgel and assured her that the episodes were not dangerous but that due to patients BP there was not any room to maneuver on her BP medications. Advised pt to make some lifestyle modifications such as limiting caffeine and making sure that she gets enough sleep. Pt voiced understanding pt also stated that she felt like she had gained 10 pound since device implantation, and that she had some SOB at times and also a heaviness in her chest, asked pt if her legs were swollen pt stated not really.   Informed pt that I had discussed this with Dr. Ladona Ridgel and he recommended she increase her lasix to 3 tablets today and 2 tablets a day until she comes to apt on 12/21/17 @ 2:00pm, also informed pt that Dr. Ladona Ridgel recommend she get a chest x -ray directions given to [facility] at  [facility]

## 2017-12-17 NOTE — Telephone Encounter (Signed)
New Message         Patient is calling stating she had a pace maker put in and her heart is racing.

## 2017-12-21 ENCOUNTER — Ambulatory Visit: Payer: BLUE CROSS/BLUE SHIELD | Admitting: Internal Medicine

## 2017-12-21 ENCOUNTER — Encounter: Payer: Self-pay | Admitting: Internal Medicine

## 2017-12-21 VITALS — BP 120/60 | HR 57 | Ht 62.0 in | Wt 136.0 lb

## 2017-12-21 DIAGNOSIS — I428 Other cardiomyopathies: Secondary | ICD-10-CM

## 2017-12-21 DIAGNOSIS — I5022 Chronic systolic (congestive) heart failure: Secondary | ICD-10-CM

## 2017-12-21 DIAGNOSIS — Z95810 Presence of automatic (implantable) cardiac defibrillator: Secondary | ICD-10-CM | POA: Diagnosis not present

## 2017-12-21 MED ORDER — CARVEDILOL 12.5 MG PO TABS: 18.7500 mg | ORAL_TABLET | Freq: Two times a day (BID) | ORAL | 3 refills | Status: DC

## 2017-12-21 MED ORDER — FUROSEMIDE 40 MG PO TABS: 40.0000 mg | ORAL_TABLET | Freq: Every day | ORAL | 3 refills | Status: DC

## 2017-12-21 NOTE — Patient Instructions (Addendum)
Medication Instructions:  Your physician has recommended you make the following change in your medication:  1.  Increase your carvedilol 12.5 mg - Take 1.5 tablets by mouth twice a day. 2.  Lasix 40 mg-take one tablet by mouth daily.  Labwork: None ordered.  Testing/Procedures: None ordered.  Follow-Up: Keep your previously scheduled follow up appointment with Dr. Ladona Ridgel on February 13, 2018 at 9:15 am.    Any Other Special Instructions Will Be Listed Below (If Applicable).  If you need a refill on your cardiac medications before your next appointment, please call your pharmacy.
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
## 2018-01-07 ENCOUNTER — Encounter: Payer: Self-pay | Admitting: Nurse Practitioner

## 2018-01-07 ENCOUNTER — Ambulatory Visit: Payer: BLUE CROSS/BLUE SHIELD | Admitting: Nurse Practitioner

## 2018-01-07 VITALS — BP 108/70 | HR 62 | Ht 62.0 in | Wt 135.1 lb

## 2018-01-07 DIAGNOSIS — I5022 Chronic systolic (congestive) heart failure: Secondary | ICD-10-CM

## 2018-01-07 DIAGNOSIS — I428 Other cardiomyopathies: Secondary | ICD-10-CM

## 2018-01-07 DIAGNOSIS — R0602 Shortness of breath: Secondary | ICD-10-CM

## 2018-01-07 DIAGNOSIS — I10 Essential (primary) hypertension: Secondary | ICD-10-CM

## 2018-01-07 DIAGNOSIS — Z95810 Presence of automatic (implantable) cardiac defibrillator: Secondary | ICD-10-CM

## 2018-01-07 LAB — BASIC METABOLIC PANEL
BUN/Creatinine Ratio: 23 (ref 12–28)
BUN: 23 mg/dL (ref 8–27)
CO2: 22 mmol/L (ref 20–29)
Calcium: 9.9 mg/dL (ref 8.7–10.3)
Chloride: 101 mmol/L (ref 96–106)
Creatinine, Ser: 1.02 mg/dL — ABNORMAL HIGH (ref 0.57–1.00)
GFR calc Af Amer: 69 mL/min/{1.73_m2} (ref >59)
GFR calc non Af Amer: 60 mL/min/{1.73_m2} (ref >59)
Glucose: 110 mg/dL — ABNORMAL HIGH (ref 65–99)
Potassium: 4.3 mmol/L (ref 3.5–5.2)
Sodium: 138 mmol/L (ref 134–144)
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
## 2018-01-07 NOTE — Patient Instructions (Addendum)
We will be checking the following labs today - BMET   Medication Instructions:    Continue with your current medicines.     Testing/Procedures To Be Arranged:  N/A  Follow-Up:   See Dr. Ladona Ridgel next month  See Dr. Anne Fu 3 months    Other Special Instructions:   N/A    If you need a refill on your cardiac medications before your next appointment, please call your pharmacy.   Call the [facility] office at [phone] if you have any questions, problems or concerns.

## 2018-02-13 ENCOUNTER — Encounter: Payer: BLUE CROSS/BLUE SHIELD | Admitting: Internal Medicine

## 2018-02-21 ENCOUNTER — Encounter: Payer: Self-pay | Admitting: Internal Medicine

## 2018-02-21 ENCOUNTER — Ambulatory Visit: Payer: BLUE CROSS/BLUE SHIELD | Admitting: Internal Medicine

## 2018-02-21 VITALS — BP 116/84 | HR 50 | Ht 62.0 in | Wt 134.6 lb

## 2018-02-21 DIAGNOSIS — Z95810 Presence of automatic (implantable) cardiac defibrillator: Secondary | ICD-10-CM

## 2018-02-21 DIAGNOSIS — I5022 Chronic systolic (congestive) heart failure: Secondary | ICD-10-CM
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
## 2018-02-21 NOTE — Patient Instructions (Signed)
Medication Instructions:  Your physician recommends that you continue on your current medications as directed. Please refer to the Current Medication list given to you today.  If you need a refill on your cardiac medications before your next appointment, please call your pharmacy.   Lab work: None ordered.  If you have labs (blood work) drawn today and your tests are completely normal, you will receive your results only by: Marland Kitchen MyChart Message (if you have MyChart) OR . A paper copy in the mail If you have any lab test that is abnormal or we need to change your treatment, we will call you to review the results.  Testing/Procedures: None ordered.  Follow-Up:  Your physician wants you to follow-up in: one year with Dr. Ladona Ridgel.   You will receive a reminder letter in the mail two months in advance. If you don't receive a letter, please call our office to schedule the follow-up appointment.  Remote monitoring is used to monitor your ICD from home. This monitoring reduces the number of office visits required to check your device to one time per year. It allows [place] to keep an eye on the functioning of your device to ensure it is working properly. You are scheduled for a device check from home on 05/23/2018. You may send your transmission at any time that day. If you have a wireless device, the transmission will be sent automatically. After your physician reviews your transmission, you will receive a postcard with your next transmission date.  At [facility], you and your health needs are our priority.  As part of our continuing mission to provide you with exceptional heart care, we have created designated Provider Care Teams.  These Care Teams include your primary Cardiologist (physician) and Advanced Practice Providers (APPs -  Physician Assistants and Nurse Practitioners) who all work together to provide you with the care you need, when you need it.  Any Other Special Instructions Will Be Listed  Below (If Applicable).

## 2018-02-22 LAB — CUP PACEART INCLINIC DEVICE CHECK
Brady Statistic RV Percent Paced: 1 %
Date Time Interrogation Session: 20191101141235
HIGH POWER IMPEDANCE MEASURED VALUE: 70 Ohm
Implantable Lead Implant Date: 20190718
Implantable Lead Location: 753858
Implantable Lead Location: 753859
Implantable Lead Location: 753860
Implantable Lead Model: 292
Implantable Lead Model: 4671
Implantable Lead Serial Number: 694576
Implantable Lead Serial Number: 815914
Lead Channel Impedance Value: 576 Ohm
Lead Channel Impedance Value: 576 Ohm
Lead Channel Pacing Threshold Amplitude: 1 V
Lead Channel Pacing Threshold Amplitude: 1.1 V
Lead Channel Pacing Threshold Pulse Width: 0.4 ms
Lead Channel Sensing Intrinsic Amplitude: 25 mV
Lead Channel Setting Pacing Amplitude: 2.4 V
Lead Channel Setting Pacing Pulse Width: 0.4 ms
Lead Channel Setting Sensing Sensitivity: 0.5 mV
MDC IDC LEAD IMPLANT DT: 20190718
MDC IDC LEAD IMPLANT DT: 20190718
MDC IDC LEAD SERIAL: 447121
MDC IDC MSMT BATTERY REMAINING LONGEVITY: 132 mo
MDC IDC MSMT LEADCHNL LV PACING THRESHOLD PULSEWIDTH: 1 ms
MDC IDC MSMT LEADCHNL RA PACING THRESHOLD AMPLITUDE: 0.6 V
MDC IDC MSMT LEADCHNL RA PACING THRESHOLD PULSEWIDTH: 0.4 ms
MDC IDC MSMT LEADCHNL RA SENSING INTR AMPL: 2.9 mV
MDC IDC MSMT LEADCHNL RV IMPEDANCE VALUE: 479 Ohm
MDC IDC MSMT LEADCHNL RV SENSING INTR AMPL: 22.3 mV
MDC IDC PG IMPLANT DT: 20190718
MDC IDC SET LEADCHNL LV PACING AMPLITUDE: 2.8 V
MDC IDC SET LEADCHNL LV SENSING SENSITIVITY: 1 mV
MDC IDC SET LEADCHNL RA PACING AMPLITUDE: 2 V
MDC IDC SET LEADCHNL RV PACING PULSEWIDTH: 0.4 ms
MDC IDC STAT BRADY RA PERCENT PACED: 9 %
Pulse Gen Serial Number: 215046

## 2018-03-01 ENCOUNTER — Ambulatory Visit: Payer: BLUE CROSS/BLUE SHIELD | Admitting: Family Medicine

## 2018-03-01 ENCOUNTER — Encounter: Payer: Self-pay | Admitting: Family Medicine

## 2018-03-01 VITALS — BP 118/68 | HR 67 | Temp 98.1°F | Ht 62.0 in | Wt 134.0 lb

## 2018-03-01 DIAGNOSIS — E538 Deficiency of other specified B group vitamins: Secondary | ICD-10-CM | POA: Diagnosis not present

## 2018-03-01 DIAGNOSIS — H6983 Other specified disorders of Eustachian tube, bilateral: Secondary | ICD-10-CM

## 2018-03-01 DIAGNOSIS — I10 Essential (primary) hypertension: Secondary | ICD-10-CM

## 2018-03-01 DIAGNOSIS — L739 Follicular disorder, unspecified: Secondary | ICD-10-CM | POA: Insufficient documentation

## 2018-03-01 DIAGNOSIS — M26629 Arthralgia of temporomandibular joint, unspecified side: Secondary | ICD-10-CM

## 2018-03-01 DIAGNOSIS — F321 Major depressive disorder, single episode, moderate: Secondary | ICD-10-CM | POA: Diagnosis not present

## 2018-03-01 MED ORDER — DOXYCYCLINE HYCLATE 100 MG PO TABS: 100.0000 mg | ORAL_TABLET | Freq: Two times a day (BID) | ORAL | 0 refills | Status: DC

## 2018-03-01 MED ORDER — IPRATROPIUM BROMIDE 0.06 % NA SOLN: 2.0000 | Freq: Four times a day (QID) | NASAL | 0 refills | Status: DC

## 2018-03-01 MED ORDER — PREDNISONE 20 MG PO TABS: 20.0000 mg | ORAL_TABLET | Freq: Every day | ORAL | 0 refills | Status: DC

## 2018-03-01 NOTE — Assessment & Plan Note (Signed)
Patient has mild eustachian tube dysfunction.  I think that this is also contributing to her ear pain, however she does have several signs of TMJ syndrome as well.  We will start a course of prednisone-she is not a candidate for NSAIDs.  Discussed home exercise program as well.  Discussed reasons to return to care.  Follow-up as needed.

## 2018-03-01 NOTE — Assessment & Plan Note (Signed)
Stable. Continue Celexa 40 mg daily. 

## 2018-03-01 NOTE — Progress Notes (Signed)
   Subjective:  Anita Diaz is a 62 y.o. female who presents today with a chief complaint of insect bite.   HPI:  Rash Located on right upper thigh.  Has been there for the past 4 weeks.  Itchy and painful.  Some drainage.  She has tried topical antibiotic ointment with no improvement.  Mild redness to the area.  No obvious precipitating events.  No other obvious alleviating or aggravating factors.  Earache Started 2 weeks ago.  Located in the left ear.  Worse with talking worse with chewing.  No feveror  chills.  Some scratchy throat.  No known sick contacts.  No treatment tried.  Depression Symptoms are stable on Celexa 20 mg daily.  Hypertension On Coreg 12.5 mg twice daily and Entresto.  Tolerating both these well without side effects.  ROS: Per HPI  PMH: She reports that she quit smoking about 9 years ago. She has a 74.00 pack-year smoking history. She has never used smokeless tobacco. She reports that she drinks alcohol. She reports that she does not use drugs.  Objective:  Physical Exam: BP 118/68 (BP Location: Left Arm, Patient Position: Sitting, Cuff Size: Normal)   Pulse 67   Temp 98.1 F (36.7 C) (Oral)   Ht 5\' 2"  (1.575 m)   Wt 134 lb (60.8 kg)   SpO2 97%   BMI 24.51 kg/m   Gen: NAD, resting comfortably HEENT: TMs with clear effusion bilaterally.  Palpable click in bilateral TMJs.  Palpation of these areas reproduces her pain. CV: RRR with no murmurs appreciated Pulm: NWOB, CTAB with no crackles, wheezes, or rhonchi Folliculitis Skin: Erythematous, pustular rash on right upper anterior thigh. Assessment/Plan:  TMJ syndrome Patient has mild eustachian tube dysfunction.  I think that this is also contributing to her ear pain, however she does have several signs of TMJ syndrome as well.  We will start a course of prednisone-she is not a candidate for NSAIDs.  Discussed home exercise program as well.  Discussed reasons to return to care.  Follow-up as  needed.  Hypertension At goal.  Continue Coreg and Entresto.  Depression, major, single episode, moderate (HCC) Stable.  Continue Celexa 40 mg daily.  B12 deficiency Check B12 level.  Eustachian tube dysfunction Start Atrovent nasal spray and prednisone.  Folliculitis No red flag signs or symptoms.  Start 1 week course of doxycycline.  [name]. [name], MD 03/01/2018 5:50 PM

## 2018-03-01 NOTE — Assessment & Plan Note (Signed)
Check B12 level. 

## 2018-03-01 NOTE — Patient Instructions (Addendum)
It was very nice to see you today!  I think you have some irritation in your joint in your jaw.  This is causing severe pain.  He also has some fluid buildup behind her ears.  Please take the prednisone for the next 5 days.  We will start Atrovent nasal spray.  The spot on your leg is inflamed/infected hair follicles.  Please take the doxycycline 1 pill twice daily for the next week.  Please avoid shaving this area until it heals.  No other changes today.  We will check a B12 level.  Please come back to see me in 3 to 6 months or sooner as needed.  Take care, Dr [name]

## 2018-03-01 NOTE — Assessment & Plan Note (Signed)
At goal.  Continue Coreg and Entresto. 

## 2018-03-02 LAB — VITAMIN B12: Vitamin B-12: 468 pg/mL (ref 200–1100)

## 2018-03-03 ENCOUNTER — Other Ambulatory Visit: Payer: Self-pay | Admitting: Family Medicine

## 2018-03-04 ENCOUNTER — Ambulatory Visit: Payer: BLUE CROSS/BLUE SHIELD | Admitting: Family Medicine

## 2018-03-04 NOTE — Progress Notes (Signed)
Please inform patient of the following:  Her B12 level is NORMAL. Do not need to make any changes to her treatment plan or do any further testing at this time.  Anita Diaz. [name], MD 03/04/2018 8:19 AM

## 2018-04-14 ENCOUNTER — Other Ambulatory Visit: Payer: Self-pay | Admitting: Nurse Practitioner

## 2018-04-22 ENCOUNTER — Encounter: Payer: Self-pay | Admitting: Cardiology

## 2018-04-22 ENCOUNTER — Ambulatory Visit (INDEPENDENT_AMBULATORY_CARE_PROVIDER_SITE_OTHER): Payer: BLUE CROSS/BLUE SHIELD | Admitting: Cardiology

## 2018-04-22 VITALS — BP 112/60 | HR 53 | Ht 62.0 in | Wt 134.8 lb

## 2018-04-22 DIAGNOSIS — I5022 Chronic systolic (congestive) heart failure: Secondary | ICD-10-CM

## 2018-04-22 DIAGNOSIS — I428 Other cardiomyopathies: Secondary | ICD-10-CM | POA: Diagnosis not present

## 2018-04-22 DIAGNOSIS — Z95810 Presence of automatic (implantable) cardiac defibrillator: Secondary | ICD-10-CM

## 2018-04-22 MED ORDER — FUROSEMIDE 40 MG PO TABS: 40.0000 mg | ORAL_TABLET | Freq: Every day | ORAL | 3 refills | Status: DC | PRN

## 2018-04-22 NOTE — Patient Instructions (Addendum)
Medication Instructions:  Please take Furosemide on an as needed basis. If weight increases 3 lbs over night take 40 mg of Furosemide. Continue all other medications as listed.  If you need a refill on your cardiac medications before your next appointment, please call your pharmacy.   Please weigh daily and keep a log of the weight.  Follow-Up: At [facility], you and your health needs are our priority.  As part of our continuing mission to provide you with exceptional heart care, we have created designated Provider Care Teams.  These Care Teams include your primary Cardiologist (physician) and Advanced Practice Providers (APPs -  Physician Assistants and Nurse Practitioners) who all work together to provide you with the care you need, when you need it. You will need a follow up appointment in 6 months Norma Fredrickson, NP and Dr Anne Fu in 1 year.  Please call our office 2 months in advance to schedule this appointment.  You may see Donato Schultz, MD or one of the following Advanced Practice Providers on your designated Care Team:   Norma Fredrickson, NP Nada Boozer, NP . Georgie Chard, NP  Thank you for choosing [facility]!!
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
## 2018-05-23 ENCOUNTER — Ambulatory Visit (INDEPENDENT_AMBULATORY_CARE_PROVIDER_SITE_OTHER): Payer: BLUE CROSS/BLUE SHIELD

## 2018-05-23 DIAGNOSIS — I428 Other cardiomyopathies: Secondary | ICD-10-CM | POA: Diagnosis not present

## 2018-05-24 LAB — CUP PACEART REMOTE DEVICE CHECK
Date Time Interrogation Session: 20200131192522
Implantable Lead Implant Date: 20190718
Implantable Lead Location: 753858
Implantable Lead Model: 292
Implantable Lead Model: 7740
Implantable Lead Serial Number: 447121
MDC IDC LEAD IMPLANT DT: 20190718
MDC IDC LEAD IMPLANT DT: 20190718
MDC IDC LEAD LOCATION: 753859
MDC IDC LEAD LOCATION: 753860
MDC IDC LEAD SERIAL: 694576
MDC IDC LEAD SERIAL: 815914
MDC IDC PG IMPLANT DT: 20190718
MDC IDC PG SERIAL: 215046

## 2018-05-30 ENCOUNTER — Encounter: Payer: Self-pay | Admitting: Family Medicine

## 2018-05-30 ENCOUNTER — Ambulatory Visit (INDEPENDENT_AMBULATORY_CARE_PROVIDER_SITE_OTHER): Payer: BLUE CROSS/BLUE SHIELD | Admitting: Family Medicine

## 2018-05-30 VITALS — BP 120/64 | HR 89 | Temp 98.0°F | Ht 62.0 in | Wt 134.0 lb

## 2018-05-30 DIAGNOSIS — M25561 Pain in right knee: Secondary | ICD-10-CM

## 2018-05-30 DIAGNOSIS — M545 Low back pain: Secondary | ICD-10-CM

## 2018-05-30 DIAGNOSIS — M25551 Pain in right hip: Secondary | ICD-10-CM | POA: Diagnosis not present

## 2018-05-30 DIAGNOSIS — M25562 Pain in left knee: Secondary | ICD-10-CM

## 2018-05-30 DIAGNOSIS — M19041 Primary osteoarthritis, right hand: Secondary | ICD-10-CM

## 2018-05-30 DIAGNOSIS — G8929 Other chronic pain: Secondary | ICD-10-CM

## 2018-05-30 DIAGNOSIS — M25552 Pain in left hip: Secondary | ICD-10-CM

## 2018-05-30 DIAGNOSIS — M19042 Primary osteoarthritis, left hand: Secondary | ICD-10-CM

## 2018-05-30 MED ORDER — PREDNISONE 50 MG PO TABS: ORAL_TABLET | ORAL | 0 refills | Status: DC

## 2018-05-30 NOTE — Progress Notes (Signed)
   Chief Complaint:  Anita Diaz is a 63 y.o. female who presents for same day appointment with a chief complaint of hand pain.   Assessment/Plan:  Hand pain/back pain/hip pain/knee pain Secondary to osteoarthritis.  She is not a candidate for NSAIDs given her cardiac history on Entresto.  Will start high-dose prednisone for the next 7 days.  Also place referral to orthopedics for further evaluation.  Seborrheic keratosis No red flags.  Reassured patient.  Continue with watchful waiting.     Subjective:  HPI:  Hand pain Patient has history of underlying osteoarthritis of bilateral hands.  She was seen for this several months ago and was started on topical diclofenac gel.  Unfortunately symptoms have not improved with this and have continued to worsen.  She also has a significant bout of pain in her lower back, bilateral hips, and bilateral knees.  Symptoms are worse with prolonged activity.  Also worse with sitting for long periods of time.  She has tried over-the-counter Tylenol and ibuprofen with no significant improvement in her symptoms.  No weakness or numbness.  Pain in her low back will sometimes radiate into her legs.  No weakness or numbness.  No bowel or bladder incontinence.  No fevers or chills.  No nausea or vomiting.  She has had a few new nodules on her right wrist as well.  Skin lesion Patient also concerned about a skin lesion on her right flank that has been there for several months.  Occasionally irritated and itchy.  No specific precipitating events.    ROS: Per HPI  PMH: She reports that she quit smoking about 9 years ago. She has a 74.00 pack-year smoking history. She has never used smokeless tobacco. She reports current alcohol use. She reports that she does not use drugs.      Objective:  Physical Exam: BP 120/64 (BP Location: Left Arm, Patient Position: Sitting, Cuff Size: Normal)   Pulse 89   Temp 98 F (36.7 C) (Oral)   Ht 5\' 2"  (1.575 m)   Wt 134 lb  (60.8 kg)   SpO2 94%   BMI 24.51 kg/m   Gen: NAD, resting comfortably CV: Regular rate and rhythm with no murmurs appreciated Pulm: Normal work of breathing, clear to auscultation bilaterally with no crackles, wheezes, or rhonchi MSK: -Right hand: 2 discrete nodules approximately 5 to 10 mm in diameter on right radial volar wrist. -Back: No deformities.  Mildly tender to palpation along bilateral lower lumbar paraspinal muscles.  Full range of motion. - Hips: No deformities.  Decreased range of motion with internal and external rotation. -Knees: No deformities.  Crepitus with active range of motion..  Strength out of 5 throughout.  Neurovascular intact distally. Skin: Waxy appearing lesion approximately 5 mm in diameter on right flank.     [name]. [name], MD 05/30/2018 5:06 PM

## 2018-05-30 NOTE — Patient Instructions (Addendum)
It was very nice to see you today!  Please start the high-dose prednisone.  Take 1 pill daily for the next 7 days.  I would also like for you to see the orthopedic surgeon.  Please let me know if your symptoms do not improve in the next few days.  Take care, Dr [name]

## 2018-05-31 NOTE — Progress Notes (Signed)
Remote ICD transmission.   

## 2018-06-03 ENCOUNTER — Telehealth: Payer: Self-pay | Admitting: *Deleted

## 2018-06-03 ENCOUNTER — Encounter: Payer: Self-pay | Admitting: Cardiology

## 2018-06-03 NOTE — Telephone Encounter (Signed)
Received alert for VT zone episode on 05/31/18 at 0704 terminated with ATP x1. Event began in VT-1 monitor zone, then rate increased into VT zone.   Spoke with patient. She doesn't recall any symptoms associated with that episode specifically. She has noticed increased exertional ShOB and chest tightness ("like someone sitting on my chest") in the past few months, symptoms improve slightly with rest but SL nitro resolves symptoms completely. Takes nitro 2-3 times a week. Taking PRN furosemide daily as she noted some weight gain, HeartLogic score 1 as of 05/31/18, no recent fluid retention per trends. Compliant with Entresto and carvedilol.  Advised patient of [place] DMV driving restrictions x6 months. Advised I will review episode with Dr. Ladona Ridgel tomorrow for recommendations. Patient verbalizes understanding and agreement with plan, denies additional questions or concerns at this time

## 2018-06-04 NOTE — Telephone Encounter (Signed)
Per Dr. Ladona Ridgel, episode is atrially-driven. No device reprogramming or medication changes recommended at this time. Driving restrictions not applicable. Dr. Ladona Ridgel recommended offering f/u to discuss SVT ablation if patient is interested. Will advise patient of recommendations.

## 2018-06-05 NOTE — Telephone Encounter (Signed)
Spoke with patient. Advised of Dr. Lubertha Basque recommendations. Patient is agreeable to an appointment with Dr. Ladona Ridgel to discuss a possible ablation. Will send message to scheduler for assistance. Patient is aware that she no longer has driving restrictions. She denies questions or concerns at this time and thanked me for my call.

## 2018-06-06 ENCOUNTER — Other Ambulatory Visit: Payer: Self-pay | Admitting: Internal Medicine

## 2018-06-14 ENCOUNTER — Encounter: Payer: Self-pay | Admitting: Internal Medicine

## 2018-06-14 ENCOUNTER — Ambulatory Visit: Payer: BLUE CROSS/BLUE SHIELD | Admitting: Internal Medicine

## 2018-06-14 VITALS — BP 122/62 | HR 51 | Ht 62.0 in | Wt 134.0 lb

## 2018-06-14 DIAGNOSIS — Z95810 Presence of automatic (implantable) cardiac defibrillator: Secondary | ICD-10-CM

## 2018-06-14 DIAGNOSIS — I471 Supraventricular tachycardia: Secondary | ICD-10-CM | POA: Diagnosis not present

## 2018-06-14 DIAGNOSIS — I5022 Chronic systolic (congestive) heart failure: Secondary | ICD-10-CM | POA: Diagnosis not present

## 2018-06-14 NOTE — Patient Instructions (Addendum)
Medication Instructions:  Your physician recommends that you continue on your current medications as directed. Please refer to the Current Medication list given to you today.  Labwork: None ordered.  Testing/Procedures: None ordered.  Follow-Up: Your physician wants you to follow-up in: 3 months with Dr. Ladona Ridgel.      Remote monitoring is used to monitor your ICD from home. This monitoring reduces the number of office visits required to check your device to one time per year. It allows [place] to keep an eye on the functioning of your device to ensure it is working properly. You are scheduled for a device check from home on 08/22/2018. You may send your transmission at any time that day. If you have a wireless device, the transmission will be sent automatically. After your physician reviews your transmission, you will receive a postcard with your next transmission date.  Any Other Special Instructions Will Be Listed Below (If Applicable).  If you need a refill on your cardiac medications before your next appointment, please call your pharmacy.
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
## 2018-06-17 LAB — CUP PACEART INCLINIC DEVICE CHECK
HighPow Impedance: 79 Ohm
Implantable Lead Implant Date: 20190718
Implantable Lead Location: 753858
Implantable Lead Location: 753859
Implantable Lead Model: 7740
Implantable Lead Serial Number: 447121
Implantable Pulse Generator Implant Date: 20190718
Lead Channel Pacing Threshold Amplitude: 0.5 V
Lead Channel Pacing Threshold Pulse Width: 0.4 ms
Lead Channel Pacing Threshold Pulse Width: 0.4 ms
Lead Channel Sensing Intrinsic Amplitude: 5.4 mV
Lead Channel Setting Pacing Amplitude: 2 V
Lead Channel Setting Pacing Pulse Width: 0.4 ms
Lead Channel Setting Sensing Sensitivity: 1 mV
MDC IDC LEAD IMPLANT DT: 20190718
MDC IDC LEAD IMPLANT DT: 20190718
MDC IDC LEAD LOCATION: 753860
MDC IDC LEAD SERIAL: 694576
MDC IDC LEAD SERIAL: 815914
MDC IDC MSMT LEADCHNL LV IMPEDANCE VALUE: 600 Ohm
MDC IDC MSMT LEADCHNL LV PACING THRESHOLD AMPLITUDE: 1.1 V
MDC IDC MSMT LEADCHNL LV SENSING INTR AMPL: 25 mV — AB
MDC IDC MSMT LEADCHNL RA IMPEDANCE VALUE: 555 Ohm
MDC IDC MSMT LEADCHNL RV IMPEDANCE VALUE: 495 Ohm
MDC IDC MSMT LEADCHNL RV PACING THRESHOLD AMPLITUDE: 0.8 V
MDC IDC MSMT LEADCHNL RV PACING THRESHOLD PULSEWIDTH: 0.4 ms
MDC IDC MSMT LEADCHNL RV SENSING INTR AMPL: 18.6 mV
MDC IDC SESS DTM: 20200221050000
MDC IDC SET LEADCHNL LV PACING AMPLITUDE: 2.6 V
MDC IDC SET LEADCHNL LV PACING PULSEWIDTH: 0.4 ms
MDC IDC SET LEADCHNL RV PACING AMPLITUDE: 2.4 V
MDC IDC SET LEADCHNL RV SENSING SENSITIVITY: 0.5 mV
Pulse Gen Serial Number: 215046

## 2018-06-21 ENCOUNTER — Ambulatory Visit (INDEPENDENT_AMBULATORY_CARE_PROVIDER_SITE_OTHER): Payer: Self-pay

## 2018-06-21 ENCOUNTER — Ambulatory Visit (INDEPENDENT_AMBULATORY_CARE_PROVIDER_SITE_OTHER): Payer: BLUE CROSS/BLUE SHIELD | Admitting: Orthopaedic Surgery

## 2018-06-21 ENCOUNTER — Encounter (INDEPENDENT_AMBULATORY_CARE_PROVIDER_SITE_OTHER): Payer: Self-pay | Admitting: Orthopaedic Surgery

## 2018-06-21 VITALS — BP 150/69 | HR 54 | Ht 62.0 in | Wt 135.0 lb

## 2018-06-21 DIAGNOSIS — M79641 Pain in right hand: Secondary | ICD-10-CM

## 2018-06-21 DIAGNOSIS — M545 Low back pain, unspecified: Secondary | ICD-10-CM

## 2018-06-21 DIAGNOSIS — G8929 Other chronic pain: Secondary | ICD-10-CM | POA: Diagnosis not present

## 2018-06-21 MED ORDER — METHYLPREDNISOLONE ACETATE 40 MG/ML IJ SUSP
20.0000 mg | INTRAMUSCULAR | Status: AC | PRN
  Administered 2018-06-21: 20 mg via INTRA_ARTICULAR

## 2018-06-21 MED ORDER — LIDOCAINE HCL 1 % IJ SOLN
1.0000 mL | INTRAMUSCULAR | Status: AC | PRN
  Administered 2018-06-21: 1 mL
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
## 2018-07-07 ENCOUNTER — Other Ambulatory Visit: Payer: Self-pay | Admitting: Family Medicine

## 2018-07-15 ENCOUNTER — Ambulatory Visit: Payer: BLUE CROSS/BLUE SHIELD

## 2018-08-22 ENCOUNTER — Ambulatory Visit (INDEPENDENT_AMBULATORY_CARE_PROVIDER_SITE_OTHER): Payer: BLUE CROSS/BLUE SHIELD | Admitting: *Deleted

## 2018-08-22 ENCOUNTER — Other Ambulatory Visit: Payer: Self-pay

## 2018-08-22 DIAGNOSIS — I5022 Chronic systolic (congestive) heart failure: Secondary | ICD-10-CM

## 2018-08-22 DIAGNOSIS — I471 Supraventricular tachycardia: Secondary | ICD-10-CM

## 2018-08-22 LAB — CUP PACEART REMOTE DEVICE CHECK
Battery Remaining Longevity: 132 mo
Brady Statistic RA Percent Paced: 1 %
Brady Statistic RV Percent Paced: 99 %
Date Time Interrogation Session: 20200430192803
HighPow Impedance: 82 Ohm
Implantable Lead Implant Date: 20190718
Implantable Lead Implant Date: 20190718
Implantable Lead Implant Date: 20190718
Implantable Lead Location: 753858
Implantable Lead Location: 753859
Implantable Lead Location: 753860
Implantable Lead Model: 292
Implantable Lead Model: 4671
Implantable Lead Model: 7740
Implantable Lead Serial Number: 447121
Implantable Lead Serial Number: 694576
Implantable Lead Serial Number: 815914
Implantable Pulse Generator Implant Date: 20190718
Lead Channel Impedance Value: 522 Ohm
Lead Channel Impedance Value: 546 Ohm
Lead Channel Impedance Value: 546 Ohm
Lead Channel Impedance Value: 673 Ohm
Lead Channel Sensing Intrinsic Amplitude: 19.7 mV
Lead Channel Sensing Intrinsic Amplitude: 25 mV
Lead Channel Sensing Intrinsic Amplitude: 7.4 mV
Lead Channel Setting Pacing Amplitude: 2 V
Lead Channel Setting Pacing Amplitude: 2.4 V
Lead Channel Setting Pacing Amplitude: 2.6 V
Lead Channel Setting Pacing Pulse Width: 0.4 ms
Lead Channel Setting Pacing Pulse Width: 0.4 ms
Lead Channel Setting Sensing Sensitivity: 0.5 mV
Lead Channel Setting Sensing Sensitivity: 1 mV
Pulse Gen Serial Number: 215046

## 2018-08-25 ENCOUNTER — Other Ambulatory Visit: Payer: Self-pay | Admitting: Family Medicine

## 2018-09-02 NOTE — Progress Notes (Signed)
Remote ICD transmission.   

## 2018-09-11 ENCOUNTER — Telehealth: Payer: Self-pay | Admitting: Internal Medicine

## 2018-09-11 NOTE — Telephone Encounter (Signed)
New message    [facility] for pt to return call about appt on 05.26.20 with Dr. Ladona Ridgel. Will offer doxemity video visit with patient if she has a smart phone. If not, will offer phone visit with Dr. Ladona Ridgel. If pt returns call, please reach out via secure chat. I will speak to pt.

## 2018-09-12 NOTE — Telephone Encounter (Signed)
Follow up   Spoke w/ pt about appt on 05.26.20 with Dr. Ladona Ridgel. 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 Pt phone number is listed in appt notes.   Pt said that she is still having the same issues as last time. She would like to discuss with Dr. Ladona Ridgel.       Virtual Visit Pre-Appointment Phone Call  "(Name), I am calling you today to discuss your upcoming appointment. We are currently trying to limit exposure to the virus that causes COVID-19 by seeing patients at home rather than in the office."  1. "What is the BEST phone number to call the day of the visit?" - include this in appointment notes  2. Do you have or have access to (through a family member/friend) a smartphone with video capability that we can use for your visit?" a. If yes - list this number in appt notes as cell (if different from BEST phone #) and list the appointment type as a VIDEO visit in appointment notes b. If no - list the appointment type as a PHONE visit in appointment notes  3. Confirm consent - "In the setting of the current Covid19 crisis, you are scheduled for a (phone or video) visit with your provider on (date) at (time).  Just as we do with many in-office visits, in order for you to participate in this visit, we must obtain consent.  If you'd like, I can send this to your mychart (if signed up) or email for you to review.  Otherwise, I can obtain your verbal consent now.  All virtual visits are billed to your insurance company just like a normal visit would be.  By agreeing to a virtual visit, we'd like you to understand that the technology does not allow for your provider to perform an examination, and thus may limit your provider's ability to fully assess your condition. If your provider identifies any concerns that need to be evaluated in person, we will make arrangements to do so.  Finally, though the technology is pretty good, we cannot assure that it will always work on  either your or our end, and in the setting of a video visit, we may have to convert it to a phone-only visit.  In either situation, we cannot ensure that we have a secure connection.  Are you willing to proceed?" STAFF: Did the patient verbally acknowledge consent to telehealth visit? Document YES/NO here: yes  4. Advise patient to be prepared - "Two hours prior to your appointment, go ahead and check your blood pressure, pulse, oxygen saturation, and your weight (if you have the equipment to check those) and write them all down. When your visit starts, your provider will ask you for this information. If you have an Apple Watch or Kardia device, please plan to have heart rate information ready on the day of your appointment. Please have a pen and paper handy nearby the day of the visit as well."  5. Give patient instructions for MyChart download to smartphone OR Doximity/Doxy.me as below if video visit (depending on what platform provider is using)  6. Inform patient they will receive a phone call 15 minutes prior to their appointment time (may be from unknown caller ID) so they should be prepared to answer    TELEPHONE CALL NOTE  Anita Diaz has been deemed a candidate for a follow-up tele-health visit to limit community exposure during the Covid-19 pandemic. I spoke with the patient  via phone to ensure availability of phone/video source, confirm preferred email & phone number, and discuss instructions and expectations.  I reminded Anita Diaz to be prepared with any vital sign and/or heart rhythm information that could potentially be obtained via home monitoring, at the time of her visit. I reminded Anita Diaz to expect a phone call prior to her visit.  Anita Diaz 09/12/2018 11:38 AM   INSTRUCTIONS FOR DOWNLOADING THE MYCHART APP TO SMARTPHONE  - The patient must first make sure to have activated MyChart and know their login information - If Apple, go to [employer] and type in  MyChart in the search bar and download the app. If Android, ask patient to go to [employer] and type in [place] in the search bar and download the app. The app is free but as with any other app downloads, their phone may require them to verify saved payment information or Apple/Android password.  - The patient will need to then log into the app with their MyChart username and password, and select [facility] as their healthcare provider to link the account. When it is time for your visit, go to the MyChart app, find appointments, and click Begin Video Visit. Be sure to Select Allow for your device to access the Microphone and Camera for your visit. You will then be connected, and your provider will be with you shortly.  **If they have any issues connecting, or need assistance please contact MyChart service desk [phone])**  **If using a computer, in order to ensure the best quality for their visit they will need to use either of the following Internet Browsers: [employer] or Google Chrome**  IF USING DOXIMITY or DOXY.ME - The patient will receive a link just prior to their visit by text.     FULL LENGTH CONSENT FOR TELE-HEALTH VISIT   I hereby voluntarily request, consent and authorize [facility] and its employed or contracted physicians, physician assistants, nurse practitioners or other licensed health care professionals (the Practitioner), to provide me with telemedicine health care services (the Services") as deemed necessary by the treating Practitioner. I acknowledge and consent to receive the Services by the Practitioner via telemedicine. I understand that the telemedicine visit will involve communicating with the Practitioner through live audiovisual communication technology and the disclosure of certain medical information by electronic transmission. I acknowledge that I have been given the opportunity to request an in-person assessment or other available  alternative prior to the telemedicine visit and am voluntarily participating in the telemedicine visit.  I understand that I have the right to withhold or withdraw my consent to the use of telemedicine in the course of my care at any time, without affecting my right to future care or treatment, and that the Practitioner or I may terminate the telemedicine visit at any time. I understand that I have the right to inspect all information obtained and/or recorded in the course of the telemedicine visit and may receive copies of available information for a reasonable fee.  I understand that some of the potential risks of receiving the Services via telemedicine include:   Delay or interruption in medical evaluation due to technological equipment failure or disruption;  Information transmitted may not be sufficient (e.g. poor resolution of images) to allow for appropriate medical decision making by the Practitioner; and/or   In rare instances, security protocols could Diaz, causing a breach of personal health information.  Furthermore, I acknowledge that it is my  responsibility to provide information about my medical history, conditions and care that is complete and accurate to the best of my ability. I acknowledge that Practitioner's advice, recommendations, and/or decision may be based on factors not within their control, such as incomplete or inaccurate data provided by me or distortions of diagnostic images or specimens that may result from electronic transmissions. I understand that the practice of medicine is not an exact science and that Practitioner makes no warranties or guarantees regarding treatment outcomes. I acknowledge that I will receive a copy of this consent concurrently upon execution via email to the email address I last provided but may also request a printed copy by calling the office of [facility].    I understand that my insurance will be billed for this visit.   I have read or had  this consent read to me.  I understand the contents of this consent, which adequately explains the benefits and risks of the Services being provided via telemedicine.   I have been provided ample opportunity to ask questions regarding this consent and the Services and have had my questions answered to my satisfaction.  I give my informed consent for the services to be provided through the use of telemedicine in my medical care  By participating in this telemedicine visit I agree to the above.

## 2018-09-12 NOTE — Telephone Encounter (Signed)
Follow up     [facility] for pt to return call about appt on 05.26.20.

## 2018-09-17 ENCOUNTER — Other Ambulatory Visit: Payer: Self-pay

## 2018-09-17 ENCOUNTER — Telehealth (INDEPENDENT_AMBULATORY_CARE_PROVIDER_SITE_OTHER): Payer: BLUE CROSS/BLUE SHIELD | Admitting: Internal Medicine

## 2018-09-17 DIAGNOSIS — I5022 Chronic systolic (congestive) heart failure: Secondary | ICD-10-CM

## 2018-09-17 DIAGNOSIS — Z95810 Presence of automatic (implantable) cardiac defibrillator: Secondary | ICD-10-CM

## 2018-09-17 DIAGNOSIS — I471 Supraventricular tachycardia: Secondary | ICD-10-CM
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
## 2018-09-18 ENCOUNTER — Telehealth: Payer: Self-pay | Admitting: Internal Medicine

## 2018-09-18 NOTE — Telephone Encounter (Signed)
Contacted patient concerning multiple transmissions in Latitude .Episodes of NSVT and 1:1 SVT in VT-1 monitor zone programmed at 130 bpm which is a known issue.Heart logic score 0. Date / times of episodes did not correlate with patient's reported symptoms. No change in medications. Pt has C/O jenndizziness  and SHOB with position change and activity that improves with rest Pt reports 6 ib weight gain over the past 7 days.  Dr Ladona Ridgel had telemedicine visit with patient 09/17/18. Pt reported today that she has also experienced substernal, non-radiating chest "heaviness" rated 7/10 over the past week that increases with activity and improves with rest. She did not inform Dr Ladona Ridgel during her visit yesterday of the chest heaviness. Pt instructed to seek treatment at [facility] ED if her chest pressure increased or her symptoms became worse. Dr Ladona Ridgel will be notified of the patient's chest pressure and condition.

## 2018-09-18 NOTE — Telephone Encounter (Signed)
LMOM. Pt sent multiple manual transmissions in Latitude 09/17/18. Call to assess patient condition and reason sent.

## 2018-09-18 NOTE — Telephone Encounter (Signed)
The pt sent a transmission because Dr. Ladona Ridgel asked her to send a transmission because she was experiencing some shortness. The pt states she didn't know if it went thru correctly because the monitor did not go all the way through the steps on her end. I let the pt know we did indeed got the multiple transmissions. The pt also states that she has been feeling like headed lately. She would like for the nurse to give her a call back. The best number to call the pt is [phone]

## 2018-09-18 NOTE — Telephone Encounter (Signed)
Understood. She has had chest pressure and sob for a long time. She has managed to continue working.

## 2018-09-19 NOTE — Telephone Encounter (Signed)
Pt informed that Dr Ladona Ridgel is aware of her sx and has not ordered any changes in medication or tx.

## 2018-10-03 ENCOUNTER — Other Ambulatory Visit: Payer: Self-pay | Admitting: Internal Medicine

## 2018-10-03 MED ORDER — CARVEDILOL 12.5 MG PO TABS: 18.7500 mg | ORAL_TABLET | Freq: Two times a day (BID) | ORAL | 3 refills | Status: DC

## 2018-11-21 ENCOUNTER — Ambulatory Visit (INDEPENDENT_AMBULATORY_CARE_PROVIDER_SITE_OTHER): Payer: BLUE CROSS/BLUE SHIELD | Admitting: *Deleted

## 2018-11-21 DIAGNOSIS — I428 Other cardiomyopathies: Secondary | ICD-10-CM

## 2018-11-22 LAB — CUP PACEART REMOTE DEVICE CHECK
Date Time Interrogation Session: 20200731092644
Implantable Lead Implant Date: 20190718
Implantable Lead Implant Date: 20190718
Implantable Lead Implant Date: 20190718
Implantable Lead Location: 753858
Implantable Lead Location: 753859
Implantable Lead Location: 753860
Implantable Lead Model: 292
Implantable Lead Model: 4671
Implantable Lead Model: 7740
Implantable Lead Serial Number: 447121
Implantable Lead Serial Number: 694576
Implantable Lead Serial Number: 815914
Implantable Pulse Generator Implant Date: 20190718
Pulse Gen Serial Number: 215046

## 2018-11-29 ENCOUNTER — Encounter: Payer: Self-pay | Admitting: Cardiology

## 2018-11-29 NOTE — Progress Notes (Signed)
Remote ICD transmission.   

## 2019-01-04 ENCOUNTER — Other Ambulatory Visit: Payer: Self-pay | Admitting: Family Medicine

## 2019-01-11 ENCOUNTER — Other Ambulatory Visit: Payer: Self-pay | Admitting: Nurse Practitioner

## 2019-01-12 ENCOUNTER — Other Ambulatory Visit: Payer: Self-pay | Admitting: Internal Medicine

## 2019-02-09 ENCOUNTER — Other Ambulatory Visit: Payer: Self-pay | Admitting: Family Medicine

## 2019-02-16 ENCOUNTER — Other Ambulatory Visit: Payer: Self-pay | Admitting: Nurse Practitioner

## 2019-02-20 ENCOUNTER — Ambulatory Visit (INDEPENDENT_AMBULATORY_CARE_PROVIDER_SITE_OTHER): Payer: BLUE CROSS/BLUE SHIELD | Admitting: *Deleted

## 2019-02-20 DIAGNOSIS — I5022 Chronic systolic (congestive) heart failure: Secondary | ICD-10-CM

## 2019-02-20 LAB — CUP PACEART REMOTE DEVICE CHECK
Battery Remaining Longevity: 132 mo
Brady Statistic RA Percent Paced: 10 %
Brady Statistic RV Percent Paced: 1 %
Date Time Interrogation Session: 20201029151148
HighPow Impedance: 64 Ohm
Implantable Lead Implant Date: 20190718
Implantable Lead Implant Date: 20190718
Implantable Lead Implant Date: 20190718
Implantable Lead Location: 753858
Implantable Lead Location: 753859
Implantable Lead Location: 753860
Implantable Lead Model: 292
Implantable Lead Model: 4671
Implantable Lead Model: 7740
Implantable Lead Serial Number: 447121
Implantable Lead Serial Number: 694576
Implantable Lead Serial Number: 815914
Implantable Pulse Generator Implant Date: 20190718
Lead Channel Impedance Value: 453 Ohm
Lead Channel Impedance Value: 517 Ohm
Lead Channel Impedance Value: 727 Ohm
Lead Channel Sensing Intrinsic Amplitude: 16 mV
Lead Channel Sensing Intrinsic Amplitude: 4 mV
Lead Channel Sensing Intrinsic Amplitude: 7.9 mV
Lead Channel Setting Pacing Amplitude: 2 V
Lead Channel Setting Pacing Amplitude: 2.4 V
Lead Channel Setting Pacing Amplitude: 2.6 V
Lead Channel Setting Pacing Pulse Width: 0.4 ms
Lead Channel Setting Pacing Pulse Width: 0.4 ms
Lead Channel Setting Sensing Sensitivity: 0.5 mV
Lead Channel Setting Sensing Sensitivity: 1 mV
Pulse Gen Serial Number: 215046

## 2019-03-14 NOTE — Progress Notes (Signed)
Remote ICD transmission.   

## 2019-03-16 ENCOUNTER — Other Ambulatory Visit: Payer: Self-pay | Admitting: Family Medicine

## 2019-04-17 ENCOUNTER — Telehealth: Payer: Self-pay

## 2019-04-17 ENCOUNTER — Other Ambulatory Visit: Payer: Self-pay | Admitting: Family Medicine

## 2019-04-17 NOTE — Telephone Encounter (Signed)
Copied from [place] [phone]. Topic: General - Other >> Apr 17, 2019 11:31 AM Anita Diaz wrote: Reason for CRM: Patient called to request Rx for citalopram (CELEXA) 40 MG tablet to be sent to the pharmacy today since she is all out

## 2019-04-17 NOTE — Telephone Encounter (Signed)
Meds sent in and called patient to let her know

## 2019-04-24 DIAGNOSIS — Z20828 Contact with and (suspected) exposure to other viral communicable diseases: Secondary | ICD-10-CM | POA: Diagnosis not present

## 2019-04-28 ENCOUNTER — Ambulatory Visit: Payer: BC Managed Care – PPO | Admitting: Cardiology

## 2019-04-28 ENCOUNTER — Other Ambulatory Visit: Payer: Self-pay

## 2019-04-28 ENCOUNTER — Encounter: Payer: Self-pay | Admitting: Cardiology

## 2019-04-28 VITALS — BP 110/70 | HR 66 | Ht 62.0 in | Wt 132.0 lb

## 2019-04-28 DIAGNOSIS — Z79899 Other long term (current) drug therapy: Secondary | ICD-10-CM | POA: Diagnosis not present

## 2019-04-28 DIAGNOSIS — R5383 Other fatigue: Secondary | ICD-10-CM

## 2019-04-28 DIAGNOSIS — E785 Hyperlipidemia, unspecified: Secondary | ICD-10-CM | POA: Diagnosis not present

## 2019-04-28 DIAGNOSIS — I5022 Chronic systolic (congestive) heart failure: Secondary | ICD-10-CM

## 2019-04-28 DIAGNOSIS — I428 Other cardiomyopathies: Secondary | ICD-10-CM

## 2019-04-28 DIAGNOSIS — I10 Essential (primary) hypertension: Secondary | ICD-10-CM | POA: Diagnosis not present

## 2019-04-28 LAB — COMPREHENSIVE METABOLIC PANEL
ALT: 10 IU/L (ref 0–32)
AST: 13 IU/L (ref 0–40)
Albumin/Globulin Ratio: 1.6 (ref 1.2–2.2)
Albumin: 4.5 g/dL (ref 3.8–4.8)
Alkaline Phosphatase: 84 IU/L (ref 39–117)
BUN/Creatinine Ratio: 18 (ref 12–28)
BUN: 15 mg/dL (ref 8–27)
Bilirubin Total: 0.5 mg/dL (ref 0.0–1.2)
CO2: 25 mmol/L (ref 20–29)
Calcium: 9.6 mg/dL (ref 8.7–10.3)
Chloride: 100 mmol/L (ref 96–106)
Creatinine, Ser: 0.85 mg/dL (ref 0.57–1.00)
GFR calc Af Amer: 85 mL/min/{1.73_m2} (ref >59)
GFR calc non Af Amer: 74 mL/min/{1.73_m2} (ref >59)
Globulin, Total: 2.9 g/dL (ref 1.5–4.5)
Glucose: 104 mg/dL — ABNORMAL HIGH (ref 65–99)
Potassium: 4.6 mmol/L (ref 3.5–5.2)
Sodium: 138 mmol/L (ref 134–144)
Total Protein: 7.4 g/dL (ref 6.0–8.5)

## 2019-04-28 LAB — CBC
Hematocrit: 39 % (ref 34.0–46.6)
Hemoglobin: 13 g/dL (ref 11.1–15.9)
MCH: 28.1 pg (ref 26.6–33.0)
MCHC: 33.3 g/dL (ref 31.5–35.7)
MCV: 84 fL (ref 79–97)
Platelets: 412 10*3/uL (ref 150–450)
RBC: 4.63 x10E6/uL (ref 3.77–5.28)
RDW: 12.7 % (ref 11.7–15.4)
WBC: 9.2 10*3/uL (ref 3.4–10.8)

## 2019-04-28 LAB — LIPID PANEL
Chol/HDL Ratio: 3.3 ratio (ref 0.0–4.4)
Cholesterol, Total: 205 mg/dL — ABNORMAL HIGH (ref 100–199)
HDL: 62 mg/dL (ref >39)
LDL Chol Calc (NIH): 109 mg/dL — ABNORMAL HIGH (ref 0–99)
Triglycerides: 201 mg/dL — ABNORMAL HIGH (ref 0–149)
VLDL Cholesterol Cal: 34 mg/dL (ref 5–40)

## 2019-04-28 LAB — TSH: TSH: 1.2 u[IU]/mL (ref 0.450–4.500)

## 2019-04-28 NOTE — Patient Instructions (Addendum)
Medication Instructions:  The current medical regimen is effective;  continue present plan and medications.  *If you need a refill on your cardiac medications before your next appointment, please call your pharmacy*  Lab Work: Please have CBC, CMP, TSH and Lipid panel today. If you have labs (blood work) drawn today and your tests are completely normal, you will receive your results only by: Marland Kitchen MyChart Message (if you have MyChart) OR . A paper copy in the mail If you have any lab test that is abnormal or we need to change your treatment, we will call you to review the results.  Follow-Up: At [facility], you and your health needs are our priority.  As part of our continuing mission to provide you with exceptional heart care, we have created designated Provider Care Teams.  These Care Teams include your primary Cardiologist (physician) and Advanced Practice Providers (APPs -  Physician Assistants and Nurse Practitioners) who all work together to provide you with the care you need, when you need it.  Your next appointment:   6 month(s)  The format for your next appointment:   In Person  Provider:   Norma Fredrickson, NP   And Dr Anne Fu in 1 year.  Thank you for choosing [facility]!!
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
## 2019-05-18 ENCOUNTER — Other Ambulatory Visit: Payer: Self-pay | Admitting: Family Medicine

## 2019-05-22 ENCOUNTER — Ambulatory Visit (INDEPENDENT_AMBULATORY_CARE_PROVIDER_SITE_OTHER): Payer: BC Managed Care – PPO | Admitting: *Deleted

## 2019-05-22 DIAGNOSIS — I5022 Chronic systolic (congestive) heart failure: Secondary | ICD-10-CM | POA: Diagnosis not present

## 2019-05-27 ENCOUNTER — Telehealth: Payer: Self-pay

## 2019-05-27 LAB — CUP PACEART REMOTE DEVICE CHECK
Battery Remaining Longevity: 132 mo
Battery Remaining Percentage: 100 %
Brady Statistic RA Percent Paced: 11 %
Brady Statistic RV Percent Paced: 1 %
Date Time Interrogation Session: 20210131074600
HighPow Impedance: 70 Ohm
Implantable Lead Implant Date: 20190718
Implantable Lead Implant Date: 20190718
Implantable Lead Implant Date: 20190718
Implantable Lead Location: 753858
Implantable Lead Location: 753859
Implantable Lead Location: 753860
Implantable Lead Model: 292
Implantable Lead Model: 4671
Implantable Lead Model: 7740
Implantable Lead Serial Number: 447121
Implantable Lead Serial Number: 694576
Implantable Lead Serial Number: 815914
Implantable Pulse Generator Implant Date: 20190718
Lead Channel Impedance Value: 429 Ohm
Lead Channel Impedance Value: 497 Ohm
Lead Channel Impedance Value: 648 Ohm
Lead Channel Setting Pacing Amplitude: 2 V
Lead Channel Setting Pacing Amplitude: 2.4 V
Lead Channel Setting Pacing Amplitude: 2.6 V
Lead Channel Setting Pacing Pulse Width: 0.4 ms
Lead Channel Setting Pacing Pulse Width: 0.4 ms
Lead Channel Setting Sensing Sensitivity: 0.5 mV
Lead Channel Setting Sensing Sensitivity: 1 mV
Pulse Gen Serial Number: 215046

## 2019-05-27 NOTE — Telephone Encounter (Signed)
Pt returned phone call.  Discussed symptoms during AT/ SVT episodes.  Pt reports she is frequently symptomatic experiencing lightheadedness when she feels her heart racing.  Overall pt reports she feels tired.  Noted from last OV that pt fatigue discussed r/t stress and pt working 2 jobs.  I asked pt how she feels compared to last OV and she reports that her fatigue has increased.

## 2019-05-27 NOTE — Telephone Encounter (Signed)
Pt returned call and left a message on answering machine for nurse to call back.

## 2019-05-27 NOTE — Telephone Encounter (Signed)
Scheduled remote transmission reflects numerous episodes of AT/ SVT.  1147 since last review 1:1 AT/ SVT rates 138-150 bmp.  Most recent episode displayed below.  VT1 monitor zone set at 130 bpm.  Current meds include Carvedilol 18.75mg  BID.    Attempted to reach pt to discuss symptoms and overall condition.  No answer, left VM to call back.    Forwarding to MD for review.  Need advice on whether to change VT1 monitor zone to higher rate.

## 2019-06-06 NOTE — Telephone Encounter (Signed)
Patient is calling wanting to know how she is to proceed from here due to still not hearing anything back in regards to it. Please advise.

## 2019-06-06 NOTE — Telephone Encounter (Signed)
Please schedule Anita Diaz to come in to discuss treatment options for her SVT. GT

## 2019-06-12 ENCOUNTER — Ambulatory Visit: Payer: BC Managed Care – PPO

## 2019-06-12 LAB — CUP PACEART REMOTE DEVICE CHECK
Battery Remaining Longevity: 132 mo
Battery Remaining Percentage: 100 %
Brady Statistic RA Percent Paced: 11 %
Brady Statistic RV Percent Paced: 1 %
Date Time Interrogation Session: 20210218044100
HighPow Impedance: 66 Ohm
Implantable Lead Implant Date: 20190718
Implantable Lead Implant Date: 20190718
Implantable Lead Implant Date: 20190718
Implantable Lead Location: 753858
Implantable Lead Location: 753859
Implantable Lead Location: 753860
Implantable Lead Model: 292
Implantable Lead Model: 4671
Implantable Lead Model: 7740
Implantable Lead Serial Number: 447121
Implantable Lead Serial Number: 694576
Implantable Lead Serial Number: 815914
Implantable Pulse Generator Implant Date: 20190718
Lead Channel Impedance Value: 473 Ohm
Lead Channel Impedance Value: 532 Ohm
Lead Channel Impedance Value: 653 Ohm
Lead Channel Setting Pacing Amplitude: 2 V
Lead Channel Setting Pacing Amplitude: 2.4 V
Lead Channel Setting Pacing Amplitude: 2.6 V
Lead Channel Setting Pacing Pulse Width: 0.4 ms
Lead Channel Setting Pacing Pulse Width: 0.4 ms
Lead Channel Setting Sensing Sensitivity: 0.5 mV
Lead Channel Setting Sensing Sensitivity: 1 mV
Pulse Gen Serial Number: 215046

## 2019-06-13 ENCOUNTER — Other Ambulatory Visit: Payer: Self-pay

## 2019-06-17 ENCOUNTER — Encounter: Payer: Self-pay | Admitting: Internal Medicine

## 2019-06-17 ENCOUNTER — Ambulatory Visit: Payer: BC Managed Care – PPO | Admitting: Internal Medicine

## 2019-06-17 ENCOUNTER — Telehealth: Payer: Self-pay | Admitting: Internal Medicine

## 2019-06-17 ENCOUNTER — Other Ambulatory Visit: Payer: Self-pay

## 2019-06-17 VITALS — BP 112/68 | HR 50 | Ht 62.0 in | Wt 130.0 lb

## 2019-06-17 DIAGNOSIS — I5022 Chronic systolic (congestive) heart failure: Secondary | ICD-10-CM | POA: Diagnosis not present

## 2019-06-17 DIAGNOSIS — Z95810 Presence of automatic (implantable) cardiac defibrillator: Secondary | ICD-10-CM

## 2019-06-17 DIAGNOSIS — I471 Supraventricular tachycardia: Secondary | ICD-10-CM | POA: Diagnosis not present

## 2019-06-17 DIAGNOSIS — I447 Left bundle-branch block, unspecified: Secondary | ICD-10-CM

## 2019-06-17 LAB — CUP PACEART INCLINIC DEVICE CHECK
Date Time Interrogation Session: 20210223160932
Implantable Lead Implant Date: 20190718
Implantable Lead Implant Date: 20190718
Implantable Lead Implant Date: 20190718
Implantable Lead Location: 753858
Implantable Lead Location: 753859
Implantable Lead Location: 753860
Implantable Lead Model: 292
Implantable Lead Model: 4671
Implantable Lead Model: 7740
Implantable Lead Serial Number: 447121
Implantable Lead Serial Number: 694576
Implantable Lead Serial Number: 815914
Implantable Pulse Generator Implant Date: 20190718
Pulse Gen Serial Number: 215046

## 2019-06-17 NOTE — Telephone Encounter (Signed)
New Message    Pt is calling and says she would like for her procedure to be done on 03/24     Please call
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
## 2019-06-17 NOTE — Patient Instructions (Signed)
Medication Instructions:  Your physician recommends that you continue on your current medications as directed. Please refer to the Current Medication list given to you today.  Labwork: None ordered.  Testing/Procedures: Your physician has recommended that you have an ablation. Catheter ablation is a medical procedure used to treat some cardiac arrhythmias (irregular heartbeats). During catheter ablation, a long, thin, flexible tube is put into a blood vessel in your groin (upper thigh), or neck. This tube is called an ablation catheter. It is then guided to your heart through the blood vessel. Radio frequency waves destroy small areas of heart tissue where abnormal heartbeats may cause an arrhythmia to start. Please see the instruction sheet given to you today.  Follow-Up:  The following dates are available for procedures: March 8, 15, 23, 24, 30  Any Other Special Instructions Will Be Listed Below (If Applicable).  If you need a refill on your cardiac medications before your next appointment, please call your pharmacy.    Cardiac Ablation Cardiac ablation is a procedure to disable (ablate) a small amount of heart tissue in very specific places. The heart has many electrical connections. Sometimes these connections are abnormal and can cause the heart to beat very fast or irregularly. Ablating some of the problem areas can improve the heart rhythm or return it to normal. Ablation may be done for people who:  Have Wolff-Parkinson-White syndrome.  Have fast heart rhythms (tachycardia).  Have taken medicines for an abnormal heart rhythm (arrhythmia) that were not effective or caused side effects.  Have a high-risk heartbeat that may be life-threatening. During the procedure, a small incision is made in the neck or the groin, and a long, thin, flexible tube (catheter) is inserted into the incision and moved to the heart. Small devices (electrodes) on the tip of the catheter will send out  electrical currents. A type of X-ray (fluoroscopy) will be used to help guide the catheter and to provide images of the heart. Tell a health care provider about:  Any allergies you have.  All medicines you are taking, including vitamins, herbs, eye drops, creams, and over-the-counter medicines.  Any problems you or family members have had with anesthetic medicines.  Any blood disorders you have.  Any surgeries you have had.  Any medical conditions you have, such as kidney failure.  Whether you are pregnant or may be pregnant. What are the risks? Generally, this is a safe procedure. However, problems may occur, including:  Infection.  Bruising and bleeding at the catheter insertion site.  Bleeding into the chest, especially into the sac that surrounds the heart. This is a serious complication.  Stroke or blood clots.  Damage to other structures or organs.  Allergic reaction to medicines or dyes.  Need for a permanent pacemaker if the normal electrical system is damaged. A pacemaker is a small computer that sends electrical signals to the heart and helps your heart beat normally.  The procedure not being fully effective. This may not be recognized until months later. Repeat ablation procedures are sometimes required. What happens before the procedure?  Follow instructions from your health care provider about eating or drinking restrictions.  Ask your health care provider about: ? Changing or stopping your regular medicines. This is especially important if you are taking diabetes medicines or blood thinners. ? Taking medicines such as aspirin and ibuprofen. These medicines can thin your blood. Do not take these medicines before your procedure if your health care provider instructs you not to.  Plan  to have someone take you home from the hospital or clinic.  If you will be going home right after the procedure, plan to have someone with you for 24 hours. What happens during  the procedure?  To lower your risk of infection: ? Your health care team will wash or sanitize their hands. ? Your skin will be washed with soap. ? Hair may be removed from the incision area.  An IV tube will be inserted into one of your veins.  You will be given a medicine to help you relax (sedative).  The skin on your neck or groin will be numbed.  An incision will be made in your neck or your groin.  A needle will be inserted through the incision and into a large vein in your neck or groin.  A catheter will be inserted into the needle and moved to your heart.  Dye may be injected through the catheter to help your surgeon see the area of the heart that needs treatment.  Electrical currents will be sent from the catheter to ablate heart tissue in desired areas. There are three types of energy that may be used to ablate heart tissue: ? Heat (radiofrequency energy). ? Laser energy. ? Extreme cold (cryoablation).  When the necessary tissue has been ablated, the catheter will be removed.  Pressure will be held on the catheter insertion area to prevent excessive bleeding.  A bandage (dressing) will be placed over the catheter insertion area. The procedure may vary among health care providers and hospitals. What happens after the procedure?  Your blood pressure, heart rate, breathing rate, and blood oxygen level will be monitored until the medicines you were given have worn off.  Your catheter insertion area will be monitored for bleeding. You will need to lie still for a few hours to ensure that you do not bleed from the catheter insertion area.  Do not drive for 24 hours or as long as directed by your health care provider. Summary  Cardiac ablation is a procedure to disable (ablate) a small amount of heart tissue in very specific places. Ablating some of the problem areas can improve the heart rhythm or return it to normal.  During the procedure, electrical currents will be  sent from the catheter to ablate heart tissue in desired areas. This information is not intended to replace advice given to you by your health care provider. Make sure you discuss any questions you have with your health care provider. Document Revised: 10/01/2017 Document Reviewed: 02/28/2016 Elsevier Patient Education  [place].

## 2019-06-17 NOTE — Telephone Encounter (Signed)
Pt scheduled for SVT/atach ablation on 07/16/19 with Dr. Ladona Ridgel  Labs/covid test scheduled  Work up complete
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
## 2019-06-20 ENCOUNTER — Other Ambulatory Visit: Payer: Self-pay | Admitting: Family Medicine

## 2019-06-25 ENCOUNTER — Telehealth: Payer: Self-pay | Admitting: Internal Medicine

## 2019-06-25 NOTE — Telephone Encounter (Signed)
New Message  Patient called and stated that she needed to speak with Dr. Lubertha Basque nurse but her medication [facility]) is too expensive. Want to know if there another alternative to the medication.  Please call

## 2019-06-25 NOTE — Telephone Encounter (Signed)
**Note De-Identified  Obfuscation** It looks like the pt has commercial ins. If this is the case she can use an Entresto $10 co-pay card. I will check with refill dept to see if we have any in the office.

## 2019-06-25 NOTE — Telephone Encounter (Signed)
Thanks so much.   Anita Diaz 

## 2019-06-25 NOTE — Telephone Encounter (Signed)
Routing to Lucan to see if there are options to help her. If not, we will need to change to either ACE/ARB therapy.   ? Any samples available - Danielle and I are not back in the office until Tuesday.   Routing to Dr. Anne Fu and Dr. Lubertha Basque nurses as well since they have been seeing her more recently.   Lawson Fiscal

## 2019-06-25 NOTE — Telephone Encounter (Signed)
**Note De-Identified  Obfuscation** The pt states that she has misplaced the $10 Entresto co-pay card that we gave her earlier and that she is due for a refill. I advised her that we are leaving her another card at our screening table in the downstairs lobby of [facility] office for her to pick up. She states that she will pick up tomorrow morning and thanked me for calling her back and providing another card.

## 2019-07-14 ENCOUNTER — Other Ambulatory Visit (HOSPITAL_COMMUNITY)
Admission: RE | Admit: 2019-07-14 | Discharge: 2019-07-14 | Disposition: A | Discharge status: Home / Self Care | Payer: BC Managed Care – PPO | Source: Ambulatory Visit | Attending: Internal Medicine | Admitting: Internal Medicine

## 2019-07-14 ENCOUNTER — Other Ambulatory Visit: Payer: Self-pay

## 2019-07-14 ENCOUNTER — Ambulatory Visit (INDEPENDENT_AMBULATORY_CARE_PROVIDER_SITE_OTHER): Payer: BC Managed Care – PPO | Admitting: Family Medicine

## 2019-07-14 ENCOUNTER — Other Ambulatory Visit: Payer: BC Managed Care – PPO | Admitting: *Deleted

## 2019-07-14 DIAGNOSIS — Z20822 Contact with and (suspected) exposure to covid-19: Secondary | ICD-10-CM | POA: Insufficient documentation

## 2019-07-14 DIAGNOSIS — F321 Major depressive disorder, single episode, moderate: Secondary | ICD-10-CM

## 2019-07-14 DIAGNOSIS — I471 Supraventricular tachycardia: Secondary | ICD-10-CM | POA: Diagnosis not present

## 2019-07-14 DIAGNOSIS — Z01812 Encounter for preprocedural laboratory examination: Secondary | ICD-10-CM | POA: Diagnosis not present

## 2019-07-14 LAB — CBC WITH DIFFERENTIAL/PLATELET
Basophils Absolute: 0.1 10*3/uL (ref 0.0–0.2)
Basos: 1 %
EOS (ABSOLUTE): 0.1 10*3/uL (ref 0.0–0.4)
Eos: 2 %
Hematocrit: 38.1 % (ref 34.0–46.6)
Hemoglobin: 12.5 g/dL (ref 11.1–15.9)
Immature Grans (Abs): 0 10*3/uL (ref 0.0–0.1)
Immature Granulocytes: 1 %
Lymphocytes Absolute: 1.7 10*3/uL (ref 0.7–3.1)
Lymphs: 22 %
MCH: 28.8 pg (ref 26.6–33.0)
MCHC: 32.8 g/dL (ref 31.5–35.7)
MCV: 88 fL (ref 79–97)
Monocytes Absolute: 0.5 10*3/uL (ref 0.1–0.9)
Monocytes: 6 %
Neutrophils Absolute: 5.3 10*3/uL (ref 1.4–7.0)
Neutrophils: 68 %
Platelets: 338 10*3/uL (ref 150–450)
RBC: 4.34 x10E6/uL (ref 3.77–5.28)
RDW: 13.6 % (ref 11.7–15.4)
WBC: 7.8 10*3/uL (ref 3.4–10.8)

## 2019-07-14 LAB — BASIC METABOLIC PANEL
BUN/Creatinine Ratio: 18 (ref 12–28)
BUN: 17 mg/dL (ref 8–27)
CO2: 24 mmol/L (ref 20–29)
Calcium: 8.9 mg/dL (ref 8.7–10.3)
Chloride: 103 mmol/L (ref 96–106)
Creatinine, Ser: 0.94 mg/dL (ref 0.57–1.00)
GFR calc Af Amer: 75 mL/min/{1.73_m2} (ref >59)
GFR calc non Af Amer: 65 mL/min/{1.73_m2} (ref >59)
Glucose: 96 mg/dL (ref 65–99)
Potassium: 4.6 mmol/L (ref 3.5–5.2)
Sodium: 140 mmol/L (ref 134–144)

## 2019-07-14 LAB — SARS CORONAVIRUS 2 (TAT 6-24 HRS): SARS Coronavirus 2: NEGATIVE

## 2019-07-14 MED ORDER — FLUOXETINE HCL 40 MG PO CAPS: 40.0000 mg | ORAL_CAPSULE | Freq: Every day | ORAL | 3 refills | Status: DC

## 2019-07-14 NOTE — Assessment & Plan Note (Signed)
Uncontrolled.  We will switch from Celexa 40 mg daily to Prozac 40 mg daily.  She will follow-up with me in a couple of weeks.  Would consider trial of Zoloft if still no improvement.

## 2019-07-14 NOTE — Progress Notes (Signed)
   Anita Diaz is a 64 y.o. female who presents today for a telephone visit.  Assessment/Plan:  Chronic Problems Addressed Today: Depression, major, single episode, moderate (HCC) Uncontrolled.  We will switch from Celexa 40 mg daily to Prozac 40 mg daily.  She will follow-up with me in a couple of weeks.  Would consider trial of Zoloft if still no improvement.     Subjective:  HPI:  She has been a little stressed recently.  Is currently on Celexa 40 mg daily but is no longer sure if it is effective.  It works well for a while.  She will be undergoing ablation radiation increases her stress as well.       Objective/Observations   NAD  Telephone Visit   I connected with Anita Diaz on 07/14/19 at 11:20 AM EDT via telephone and verified that I am speaking with the correct person using two identifiers. I discussed the limitations of evaluation and management by telemedicine and the availability of in person appointments. The patient expressed understanding and agreed to proceed.   Patient location: Home Provider location: [place] [facility] [employer] Persons participating in the virtual visit: Myself and Patient      [name]. [name], MD 07/14/2019 11:52 AM

## 2019-07-14 NOTE — Progress Notes (Signed)
Instructed patient on the following items: Arrival time 0545 Nothing to eat or drink after midnight No meds AM of procedure Responsible person to drive you home and stay with you for 24 hrs

## 2019-07-15 ENCOUNTER — Encounter (HOSPITAL_COMMUNITY)
Admission: RE | Disposition: A | Discharge status: Home / Self Care | Payer: Self-pay | Source: Home / Self Care | Attending: Internal Medicine

## 2019-07-15 ENCOUNTER — Ambulatory Visit (HOSPITAL_COMMUNITY)
Admission: RE | Admit: 2019-07-15 | Discharge: 2019-07-15 | Disposition: A | Discharge status: Home / Self Care | Payer: BC Managed Care – PPO | Attending: Internal Medicine | Admitting: Internal Medicine

## 2019-07-15 DIAGNOSIS — I5022 Chronic systolic (congestive) heart failure: Secondary | ICD-10-CM | POA: Insufficient documentation

## 2019-07-15 DIAGNOSIS — I110 Hypertensive heart disease with heart failure: Secondary | ICD-10-CM | POA: Diagnosis not present

## 2019-07-15 DIAGNOSIS — Z7982 Long term (current) use of aspirin: Secondary | ICD-10-CM | POA: Insufficient documentation

## 2019-07-15 DIAGNOSIS — Z95810 Presence of automatic (implantable) cardiac defibrillator: Secondary | ICD-10-CM | POA: Insufficient documentation

## 2019-07-15 DIAGNOSIS — Z87891 Personal history of nicotine dependence: Secondary | ICD-10-CM | POA: Insufficient documentation

## 2019-07-15 DIAGNOSIS — F329 Major depressive disorder, single episode, unspecified: Secondary | ICD-10-CM | POA: Diagnosis not present

## 2019-07-15 DIAGNOSIS — I471 Supraventricular tachycardia: Secondary | ICD-10-CM | POA: Diagnosis not present

## 2019-07-15 DIAGNOSIS — Z79899 Other long term (current) drug therapy: Secondary | ICD-10-CM | POA: Insufficient documentation

## 2019-07-15 DIAGNOSIS — F319 Bipolar disorder, unspecified: Secondary | ICD-10-CM | POA: Insufficient documentation

## 2019-07-15 DIAGNOSIS — E785 Hyperlipidemia, unspecified: Secondary | ICD-10-CM | POA: Diagnosis not present

## 2019-07-15 HISTORY — PX: ATRIAL TACH ABLATION: EP1192

## 2019-07-15 SURGERY — ATRIAL TACH ABLATION

## 2019-07-15 MED ORDER — SODIUM CHLORIDE 0.9% FLUSH: 3.0000 mL | Freq: Two times a day (BID) | INTRAVENOUS | Status: DC

## 2019-07-15 MED ORDER — MIDAZOLAM HCL 5 MG/5ML IJ SOLN
INTRAMUSCULAR | Status: DC | PRN
  Administered 2019-07-15 (×3): 1 mg via INTRAVENOUS
  Administered 2019-07-15: 2 mg via INTRAVENOUS

## 2019-07-15 MED ORDER — FENTANYL CITRATE (PF) 100 MCG/2ML IJ SOLN
INTRAMUSCULAR | Status: DC | PRN
  Administered 2019-07-15 (×2): 12.5 ug via INTRAVENOUS
  Administered 2019-07-15: 25 ug via INTRAVENOUS

## 2019-07-15 MED ORDER — SODIUM CHLORIDE 0.9 % IV SOLN: INTRAVENOUS | Status: DC

## 2019-07-15 MED ORDER — LIDOCAINE HCL (PF) 1 % IJ SOLN
INTRAMUSCULAR | Status: AC
  Filled 2019-07-15: qty 60

## 2019-07-15 MED ORDER — SODIUM CHLORIDE 0.9 % IV SOLN: 250.0000 mL | INTRAVENOUS | Status: DC | PRN

## 2019-07-15 MED ORDER — MIDAZOLAM HCL 5 MG/5ML IJ SOLN
INTRAMUSCULAR | Status: AC
  Filled 2019-07-15: qty 5

## 2019-07-15 MED ORDER — ONDANSETRON HCL 4 MG/2ML IJ SOLN: 4.0000 mg | Freq: Four times a day (QID) | INTRAMUSCULAR | Status: DC | PRN

## 2019-07-15 MED ORDER — FENTANYL CITRATE (PF) 100 MCG/2ML IJ SOLN
INTRAMUSCULAR | Status: AC
  Filled 2019-07-15: qty 2

## 2019-07-15 MED ORDER — HEPARIN (PORCINE) IN NACL 1000-0.9 UT/500ML-% IV SOLN
INTRAVENOUS | Status: DC | PRN
  Administered 2019-07-15: 500 mL

## 2019-07-15 MED ORDER — BUPIVACAINE HCL (PF) 0.25 % IJ SOLN
INTRAMUSCULAR | Status: DC | PRN
  Administered 2019-07-15: 60 mL

## 2019-07-15 MED ORDER — ACETAMINOPHEN 325 MG PO TABS
650.0000 mg | ORAL_TABLET | ORAL | Status: DC | PRN
  Filled 2019-07-15: qty 2

## 2019-07-15 MED ORDER — SODIUM CHLORIDE 0.9% FLUSH: 3.0000 mL | INTRAVENOUS | Status: DC | PRN

## 2019-07-15 SURGICAL SUPPLY — 12 items
BAG SNAP BAND KOVER 36X36 (MISCELLANEOUS) ×1 IMPLANT
CATH DECANAV F CURVE (CATHETERS) ×1 IMPLANT
CATH EZ STEER NAV 4MM D-F CUR (ABLATOR) ×1 IMPLANT
CATH JOSEPH QUAD ALLRED 6F REP (CATHETERS) ×1 IMPLANT
PACK EP LATEX FREE (CUSTOM PROCEDURE TRAY) ×2
PACK EP LF (CUSTOM PROCEDURE TRAY) ×1 IMPLANT
PAD PRO RADIOLUCENT 2001M-C (PAD) ×2 IMPLANT
PATCH CARTO3 (PAD) ×1 IMPLANT
SHEATH PINNACLE 6F 10CM (SHEATH) ×1 IMPLANT
SHEATH PINNACLE 7F 10CM (SHEATH) ×1 IMPLANT
SHEATH PINNACLE 8F 10CM (SHEATH) ×1 IMPLANT
SHIELD RADPAD SCOOP 12X17 (MISCELLANEOUS) ×1 IMPLANT

## 2019-07-15 NOTE — Progress Notes (Signed)
Site area: Right groin a 6, 7, and 8 french venous sheath was removed  Site Prior to Removal:  Level 0  Pressure Applied For 20 MINUTES    Bedrest Beginning at 1030am  Manual:   Yes.    Patient Status During Pull:  stable  Post Pull Groin Site:  Level 0  Post Pull Instructions Given:  Yes.    Post Pull Pulses Present:  Yes.    Dressing Applied:  Yes.    Comments:

## 2019-07-15 NOTE — Interval H&P Note (Signed)
History and Physical Interval Note:  07/15/2019 7:40 AM  Anita Diaz  has presented today for surgery, with the diagnosis of atach.  The various methods of treatment have been discussed with the patient and family. After consideration of risks, benefits and other options for treatment, the patient has consented to  Procedure(s): ATRIAL TACH ABLATION (N/A) as a surgical intervention.  The patient's history has been reviewed, patient examined, no change in status, stable for surgery.  I have reviewed the patient's chart and labs.  Questions were answered to the patient's satisfaction.     Lewayne Bunting

## 2019-07-15 NOTE — Progress Notes (Signed)
Report received from Janice Walker,RN 

## 2019-07-15 NOTE — Progress Notes (Signed)
Dr Taylor in to see client and ok to d/c home 

## 2019-07-15 NOTE — Progress Notes (Signed)
Up and walked and tolerated well; right groin stable, no bleeding or hematoma 

## 2019-07-15 NOTE — Progress Notes (Addendum)
Client c/o chest tightness 6/10 and states has had it for a couple of hours and states has pain with inspiration, Dr Ladona Ridgel notified and he will be in to see client

## 2019-07-15 NOTE — Discharge Instructions (Signed)

## 2019-07-16 MED FILL — Lidocaine HCl Local Preservative Free (PF) Inj 1%: INTRAMUSCULAR | Qty: 30 | Status: AC

## 2019-08-15 ENCOUNTER — Ambulatory Visit: Payer: BC Managed Care – PPO | Admitting: Internal Medicine

## 2019-08-15 ENCOUNTER — Other Ambulatory Visit: Payer: Self-pay

## 2019-08-15 ENCOUNTER — Encounter: Payer: Self-pay | Admitting: Internal Medicine

## 2019-08-15 VITALS — BP 130/72 | HR 57 | Ht 62.0 in | Wt 132.0 lb

## 2019-08-15 DIAGNOSIS — I447 Left bundle-branch block, unspecified: Secondary | ICD-10-CM

## 2019-08-15 DIAGNOSIS — Z95810 Presence of automatic (implantable) cardiac defibrillator: Secondary | ICD-10-CM

## 2019-08-15 DIAGNOSIS — I471 Supraventricular tachycardia: Secondary | ICD-10-CM

## 2019-08-15 DIAGNOSIS — I5022 Chronic systolic (congestive) heart failure: Secondary | ICD-10-CM | POA: Diagnosis not present

## 2019-08-15 LAB — CUP PACEART INCLINIC DEVICE CHECK
Brady Statistic RA Percent Paced: 11 %
Brady Statistic RV Percent Paced: 1 % — CL
Date Time Interrogation Session: 20210423000000
HighPow Impedance: 66 Ohm
Implantable Lead Implant Date: 20190718
Implantable Lead Implant Date: 20190718
Implantable Lead Implant Date: 20190718
Implantable Lead Location: 753858
Implantable Lead Location: 753859
Implantable Lead Location: 753860
Implantable Lead Model: 292
Implantable Lead Model: 4671
Implantable Lead Model: 7740
Implantable Lead Serial Number: 447121
Implantable Lead Serial Number: 694576
Implantable Lead Serial Number: 815914
Implantable Pulse Generator Implant Date: 20190718
Lead Channel Impedance Value: 458 Ohm
Lead Channel Impedance Value: 528 Ohm
Lead Channel Impedance Value: 565 Ohm
Lead Channel Pacing Threshold Amplitude: 0.6 V
Lead Channel Pacing Threshold Amplitude: 1 V
Lead Channel Pacing Threshold Amplitude: 1.4 V
Lead Channel Pacing Threshold Pulse Width: 0.4 ms
Lead Channel Pacing Threshold Pulse Width: 0.4 ms
Lead Channel Pacing Threshold Pulse Width: 0.4 ms
Lead Channel Sensing Intrinsic Amplitude: 17.3 mV
Lead Channel Sensing Intrinsic Amplitude: 17.6 mV
Lead Channel Sensing Intrinsic Amplitude: 2.1 mV
Lead Channel Setting Pacing Amplitude: 2 V
Lead Channel Setting Pacing Amplitude: 2.4 V
Lead Channel Setting Pacing Amplitude: 2.6 V
Lead Channel Setting Pacing Pulse Width: 0.4 ms
Lead Channel Setting Pacing Pulse Width: 0.4 ms
Lead Channel Setting Sensing Sensitivity: 0.5 mV
Lead Channel Setting Sensing Sensitivity: 1 mV
Pulse Gen Serial Number: 215046
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
## 2019-08-15 NOTE — Patient Instructions (Addendum)
Medication Instructions:  Your physician recommends that you continue on your current medications as directed. Please refer to the Current Medication list given to you today.  Labwork: None ordered.  Testing/Procedures: None ordered.  Follow-Up: Your physician wants you to follow-up in: 6 months with Dr. Ladona Ridgel.   You will receive a reminder letter in the mail two months in advance. If you don't receive a letter, please call our office to schedule the follow-up appointment.  Remote monitoring is used to monitor your ICD from home. This monitoring reduces the number of office visits required to check your device to one time per year. It allows [place] to keep an eye on the functioning of your device to ensure it is working properly. You are scheduled for a device check from home on 09/11/2019. You may send your transmission at any time that day. If you have a wireless device, the transmission will be sent automatically. After your physician reviews your transmission, you will receive a postcard with your next transmission date.  Any Other Special Instructions Will Be Listed Below (If Applicable).  If you need a refill on your cardiac medications before your next appointment, please call your pharmacy.

## 2019-08-20 ENCOUNTER — Other Ambulatory Visit (INDEPENDENT_AMBULATORY_CARE_PROVIDER_SITE_OTHER): Payer: BC Managed Care – PPO | Admitting: *Deleted

## 2019-08-20 ENCOUNTER — Ambulatory Visit (INDEPENDENT_AMBULATORY_CARE_PROVIDER_SITE_OTHER): Payer: BC Managed Care – PPO | Admitting: Family Medicine

## 2019-08-20 ENCOUNTER — Ambulatory Visit (INDEPENDENT_AMBULATORY_CARE_PROVIDER_SITE_OTHER): Payer: BC Managed Care – PPO

## 2019-08-20 ENCOUNTER — Other Ambulatory Visit: Payer: Self-pay

## 2019-08-20 VITALS — BP 108/60 | HR 58 | Temp 98.2°F | Ht 62.0 in | Wt 130.2 lb

## 2019-08-20 DIAGNOSIS — M542 Cervicalgia: Secondary | ICD-10-CM

## 2019-08-20 DIAGNOSIS — F321 Major depressive disorder, single episode, moderate: Secondary | ICD-10-CM

## 2019-08-20 IMAGING — DX DG CERVICAL SPINE COMPLETE 4+V
5 series · 5 of 5 positions shown · non-contrast
Comparison: [DATE]

CLINICAL DATA: Neck pain

EXAM:
CERVICAL SPINE - COMPLETE 4+ VIEW

[cervical spine lat]
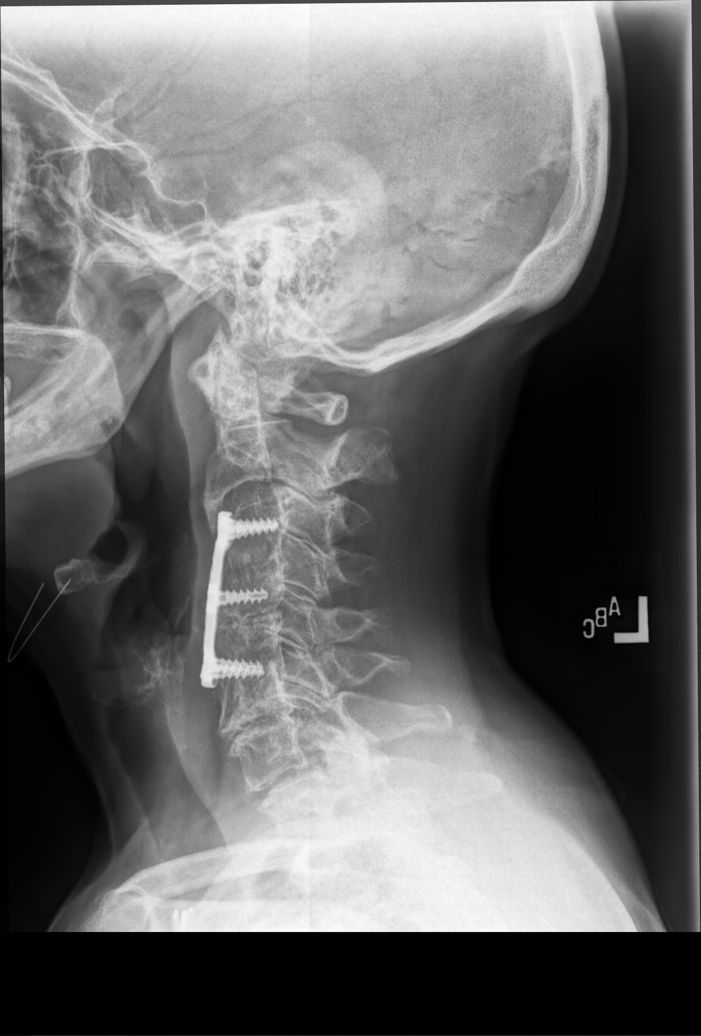

[cervical spine oblique (1 of 2)]
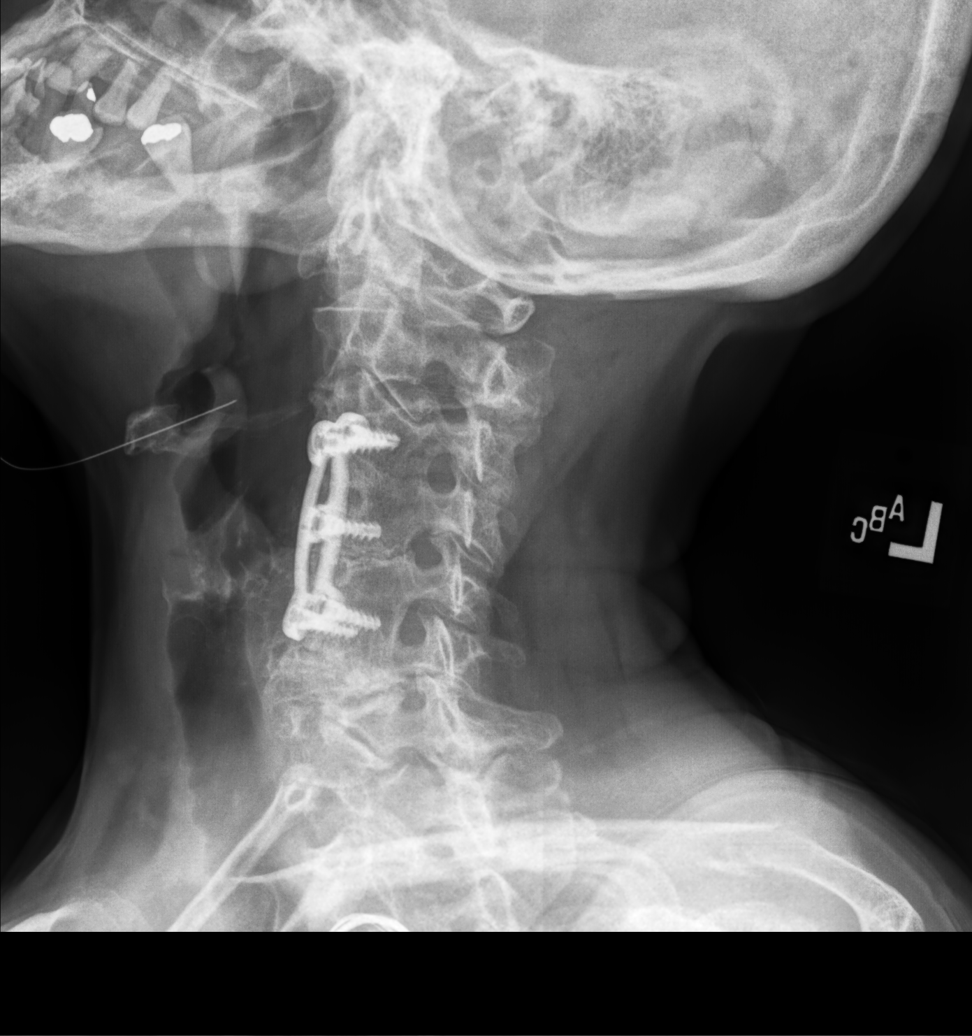

[cervical spine oblique (2 of 2)]
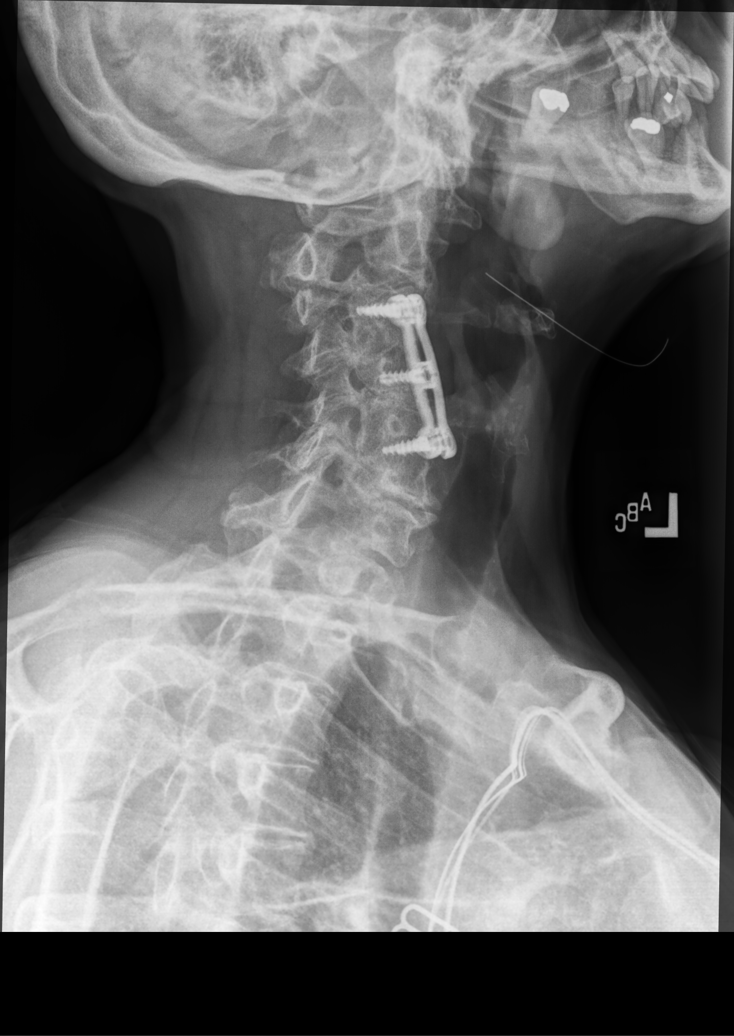

[cervical spine ap (1 of 2)]
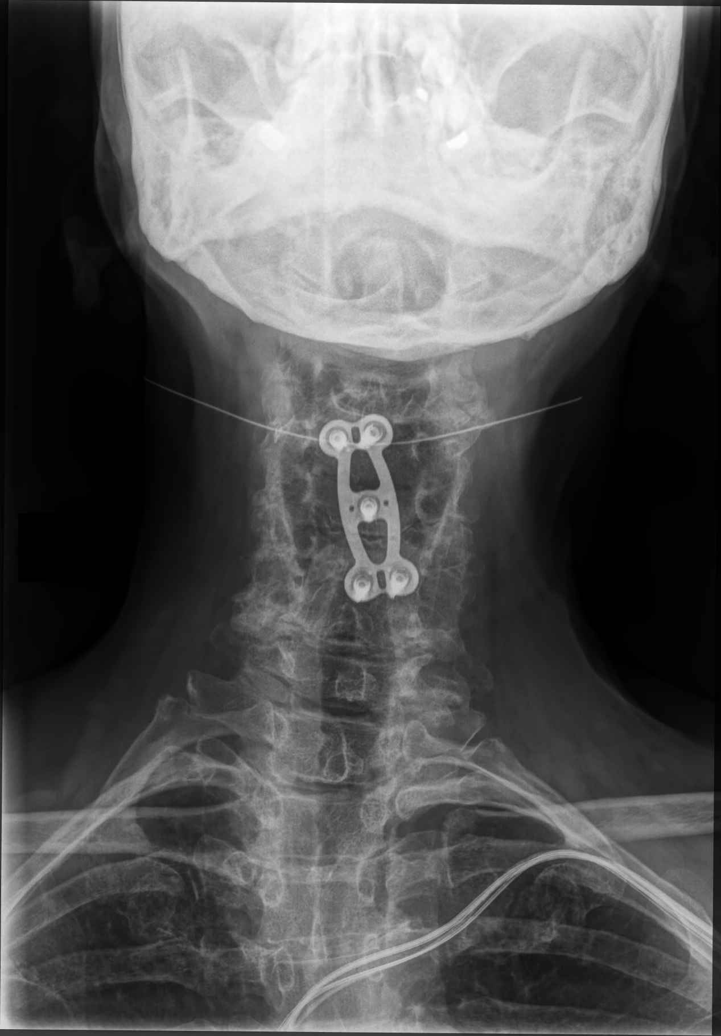

[cervical spine ap (2 of 2)]
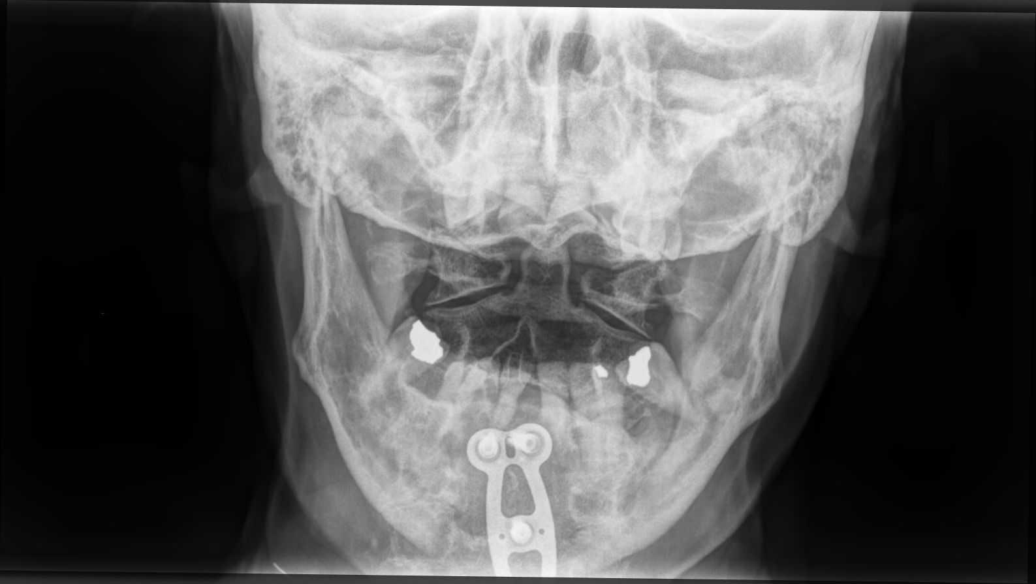

[5 of 5 positions shown; findings below may reference images not displayed]

FINDINGS: Degenerative straightening of the normal cervical lordosis.
Redemonstrated postoperative findings of anterior cervical
discectomy and fusion of C3 through C6, with moderate disc space
height loss and osteophytosis at the remaining disc levels. No
evident bony neural foraminal stenosis. The partially imaged skull
base, cervical soft tissues, and upper chest are unremarkable.
IMPRESSION: Degenerative straightening of the normal cervical lordosis.
Redemonstrated postoperative findings of anterior cervical
discectomy and fusion of C3 through C6, with moderate disc space
height loss and osteophytosis at the remaining disc levels. No
evident bony neural foraminal stenosis.

## 2019-08-20 MED ORDER — METHYLPREDNISOLONE ACETATE 80 MG/ML IJ SUSP
80.0000 mg | Freq: Once | INTRAMUSCULAR | Status: AC
  Administered 2019-08-20: 80 mg via INTRAMUSCULAR

## 2019-08-20 MED ORDER — CYCLOBENZAPRINE HCL 10 MG PO TABS: 10.0000 mg | ORAL_TABLET | Freq: Three times a day (TID) | ORAL | 0 refills | Status: DC | PRN

## 2019-08-20 MED ORDER — PREDNISONE 50 MG PO TABS: ORAL_TABLET | ORAL | 0 refills | Status: DC

## 2019-08-20 MED ORDER — TRAMADOL HCL 50 MG PO TABS: 50.0000 mg | ORAL_TABLET | Freq: Three times a day (TID) | ORAL | 0 refills | Status: AC | PRN

## 2019-08-20 NOTE — Assessment & Plan Note (Signed)
Doing much better on Prozac 40 mg daily.  We will continue current dose.

## 2019-08-20 NOTE — Patient Instructions (Signed)
It was very nice to see you today!  I think you have a spasm in your neck.  We will start Flexeril and prednisone.  We will give you an injection of steroid today.  Please start the prednisone tomorrow.  You can use the tramadol if needed.  We will check an x-ray of your neck.  Let me know if not improving in the next several days.  Take care, Dr [name]  Please try these tips to maintain a healthy lifestyle:   Eat at least 3 REAL meals and 1-2 snacks per day.  Aim for no more than 5 hours between eating.  If you eat breakfast, please do so within one hour of getting up.    Each meal should contain half fruits/vegetables, one quarter protein, and one quarter carbs (no bigger than a computer mouse)   Cut down on sweet beverages. This includes juice, soda, and sweet tea.     Drink at least 1 glass of water with each meal and aim for at least 8 glasses per day   Exercise at least 150 minutes every week.

## 2019-08-20 NOTE — Progress Notes (Signed)
   Anita Diaz is a 64 y.o. female who presents today for an office visit.  Assessment/Plan:  Chronic Problems Addressed Today: Depression, major, single episode, moderate (HCC) Doing much better on Prozac 40 mg daily.  We will continue current dose.  Neck pain Flareup related to recent trauma.  Given recent trauma and the fact that symptoms have not improved over the last several weeks, will check plain film today to rule out occult fracture.  Will give 80 mg of IM Depo-Medrol today.  We will also start Flexeril and prednisone burst tomorrow.  We will also give a small supply of tramadol to use as needed for breakthrough pain.  Discussed home exercise program handout was given.  If no improvement in the next few days would consider referral to sports medicine and/or physical therapy.    Subjective:  HPI:  Patient with flareup of neck pain for the past 5 to 6 weeks.  Stated that she was on a stepstool about 2 feet off the ground when the ladder slipped and she fell.  Since then has had persistent pain.  Worse with certain motions.  She has a hard time turning her neck.  She has tried icy hot and heating pads with no significant improvement.  No worsening weakness, tingling, or numbness.  No reported bowel or bladder incontinence.        Objective:  Physical Exam: BP 108/60   Pulse (!) 58   Temp 98.2 F (36.8 C)   Ht 5\' 2"  (1.575 m)   Wt 130 lb 4 oz (59.1 kg)   SpO2 93%   BMI 23.82 kg/m   Gen: No acute distress, resting comfortably CV: Regular rate and rhythm with no murmurs appreciated Pulm: Normal work of breathing, clear to auscultation bilaterally with no crackles, wheezes, or rhonchi MSK: Limited range of motion in neck due to pain.  Pain at bilateral paraspinal cervical spine muscles.  Bilateral upper and lower extremities with 5 out of 5 strength.  Reflexes 2+ and symmetric bilaterally. Neuro: Grossly normal, moves all extremities Psych: Normal affect and thought  content      Cael Worth M. , MD 08/20/2019 10:48 AM

## 2019-08-20 NOTE — Assessment & Plan Note (Signed)
Flareup related to recent trauma.  Given recent trauma and the fact that symptoms have not improved over the last several weeks, will check plain film today to rule out occult fracture.  Will give 80 mg of IM Depo-Medrol today.  We will also start Flexeril and prednisone burst tomorrow.  We will also give a small supply of tramadol to use as needed for breakthrough pain.  Discussed home exercise program handout was given.  If no improvement in the next few days would consider referral to sports medicine and/or physical therapy.

## 2019-08-22 NOTE — Progress Notes (Signed)
Please inform patient of the following:  Xrays shows arthritis and degenerative changes in her neck but nothing new or acute. Recommend she continue with the treatment plant discussed and would like for her to let us know if not improving.  Anita Diaz. [name], MD 08/22/2019 9:53 AM

## 2019-09-11 ENCOUNTER — Other Ambulatory Visit: Payer: Self-pay | Admitting: Family Medicine

## 2019-09-11 ENCOUNTER — Ambulatory Visit (INDEPENDENT_AMBULATORY_CARE_PROVIDER_SITE_OTHER): Payer: BC Managed Care – PPO | Admitting: *Deleted

## 2019-09-11 DIAGNOSIS — I428 Other cardiomyopathies: Secondary | ICD-10-CM

## 2019-09-11 DIAGNOSIS — Z1231 Encounter for screening mammogram for malignant neoplasm of breast: Secondary | ICD-10-CM

## 2019-09-11 LAB — CUP PACEART REMOTE DEVICE CHECK
Battery Remaining Longevity: 132 mo
Battery Remaining Percentage: 100 %
Brady Statistic RA Percent Paced: 7 %
Brady Statistic RV Percent Paced: 0 %
Date Time Interrogation Session: 20210520044100
HighPow Impedance: 71 Ohm
Implantable Lead Implant Date: 20190718
Implantable Lead Implant Date: 20190718
Implantable Lead Implant Date: 20190718
Implantable Lead Location: 753858
Implantable Lead Location: 753859
Implantable Lead Location: 753860
Implantable Lead Model: 292
Implantable Lead Model: 4671
Implantable Lead Model: 7740
Implantable Lead Serial Number: 447121
Implantable Lead Serial Number: 694576
Implantable Lead Serial Number: 815914
Implantable Pulse Generator Implant Date: 20190718
Lead Channel Impedance Value: 474 Ohm
Lead Channel Impedance Value: 497 Ohm
Lead Channel Impedance Value: 579 Ohm
Lead Channel Setting Pacing Amplitude: 2 V
Lead Channel Setting Pacing Amplitude: 2.4 V
Lead Channel Setting Pacing Amplitude: 2.6 V
Lead Channel Setting Pacing Pulse Width: 0.4 ms
Lead Channel Setting Pacing Pulse Width: 0.4 ms
Lead Channel Setting Sensing Sensitivity: 0.5 mV
Lead Channel Setting Sensing Sensitivity: 1 mV
Pulse Gen Serial Number: 215046

## 2019-09-15 ENCOUNTER — Other Ambulatory Visit: Payer: Self-pay | Admitting: *Deleted

## 2019-09-15 ENCOUNTER — Telehealth: Payer: Self-pay | Admitting: Family Medicine

## 2019-09-15 MED ORDER — TIZANIDINE HCL 4 MG PO CAPS: ORAL_CAPSULE | ORAL | 1 refills | Status: DC

## 2019-09-15 NOTE — Telephone Encounter (Signed)
Please advise 

## 2019-09-15 NOTE — Telephone Encounter (Signed)
Rx Zanaflex send to pharmacy

## 2019-09-15 NOTE — Telephone Encounter (Signed)
Patient was seen on 4/28. She was given Flexeril 10mg  for her muscle spasms. She states the medication is not working and would like some stronger called in. Please follow up with patient. Thank you.

## 2019-09-15 NOTE — Progress Notes (Signed)
Remote ICD transmission.   

## 2019-09-15 NOTE — Telephone Encounter (Signed)
Ok to send in zanaflex 8mg  tid prn.  . [name], MD 09/15/2019 2:51 PM

## 2019-09-23 ENCOUNTER — Ambulatory Visit (INDEPENDENT_AMBULATORY_CARE_PROVIDER_SITE_OTHER): Payer: BC Managed Care – PPO

## 2019-09-23 ENCOUNTER — Other Ambulatory Visit: Payer: Self-pay | Admitting: *Deleted

## 2019-09-23 ENCOUNTER — Other Ambulatory Visit: Payer: BC Managed Care – PPO

## 2019-09-23 ENCOUNTER — Other Ambulatory Visit: Payer: Self-pay

## 2019-09-23 ENCOUNTER — Telehealth: Payer: Self-pay | Admitting: Family Medicine

## 2019-09-23 DIAGNOSIS — M542 Cervicalgia: Secondary | ICD-10-CM

## 2019-09-23 DIAGNOSIS — M25511 Pain in right shoulder: Secondary | ICD-10-CM

## 2019-09-23 IMAGING — DX DG SHOULDER 2+V*R*
3 series · 3 of 3 positions shown · non-contrast
Comparison: None.

CLINICAL DATA: Right shoulder pain.

EXAM:
RIGHT SHOULDER - 2+ VIEW

[shoulder grashey ap]
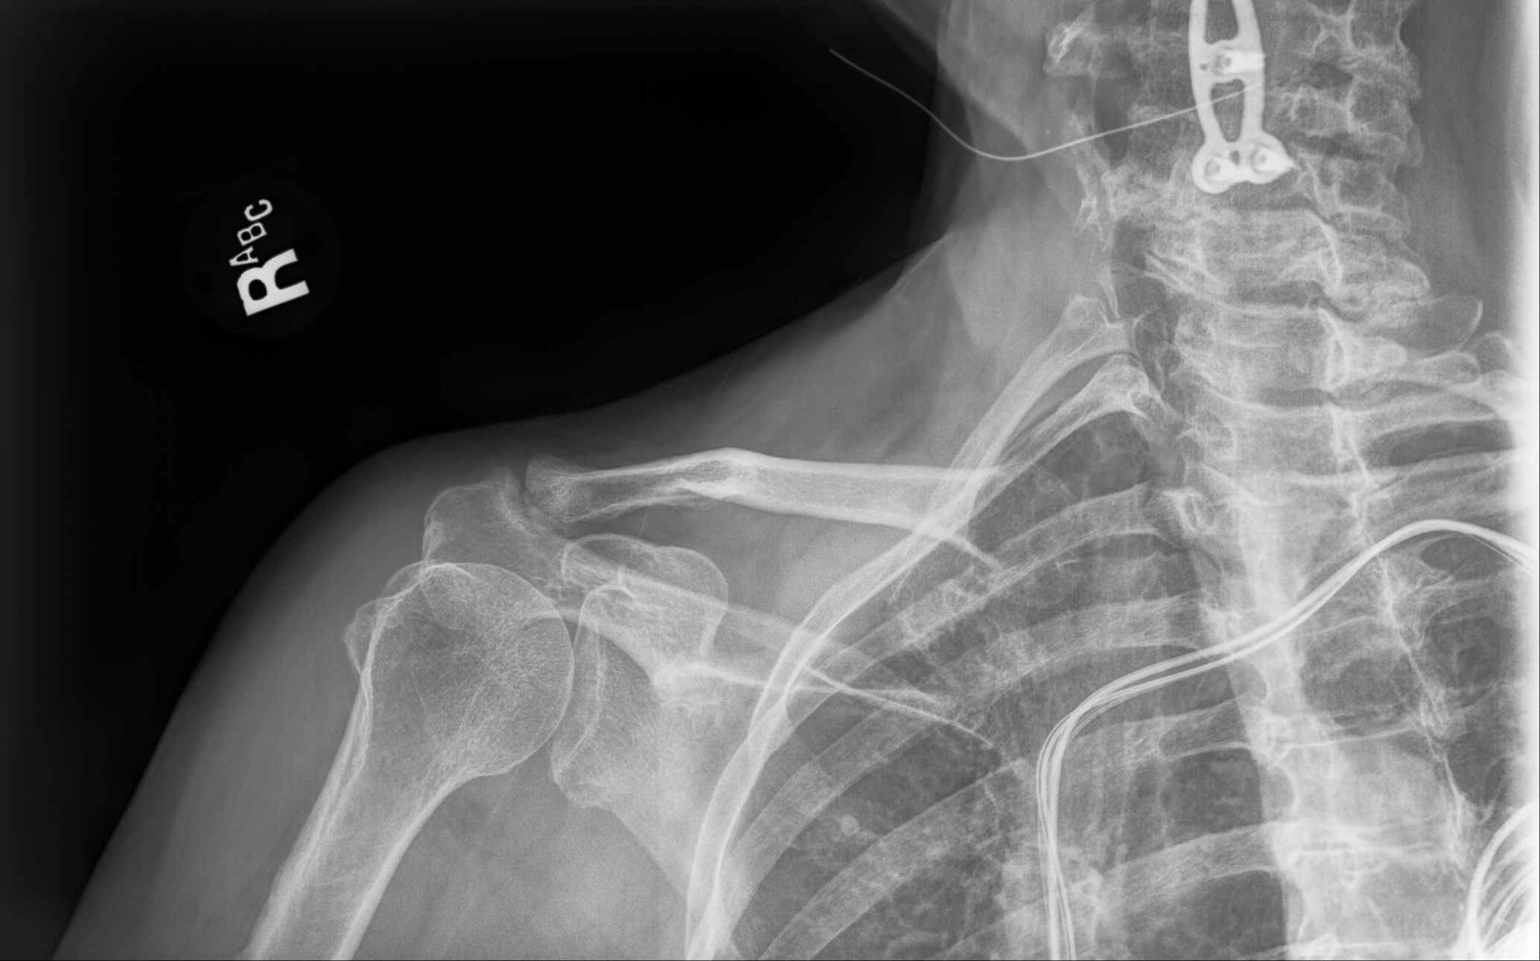

[shoulder y view]
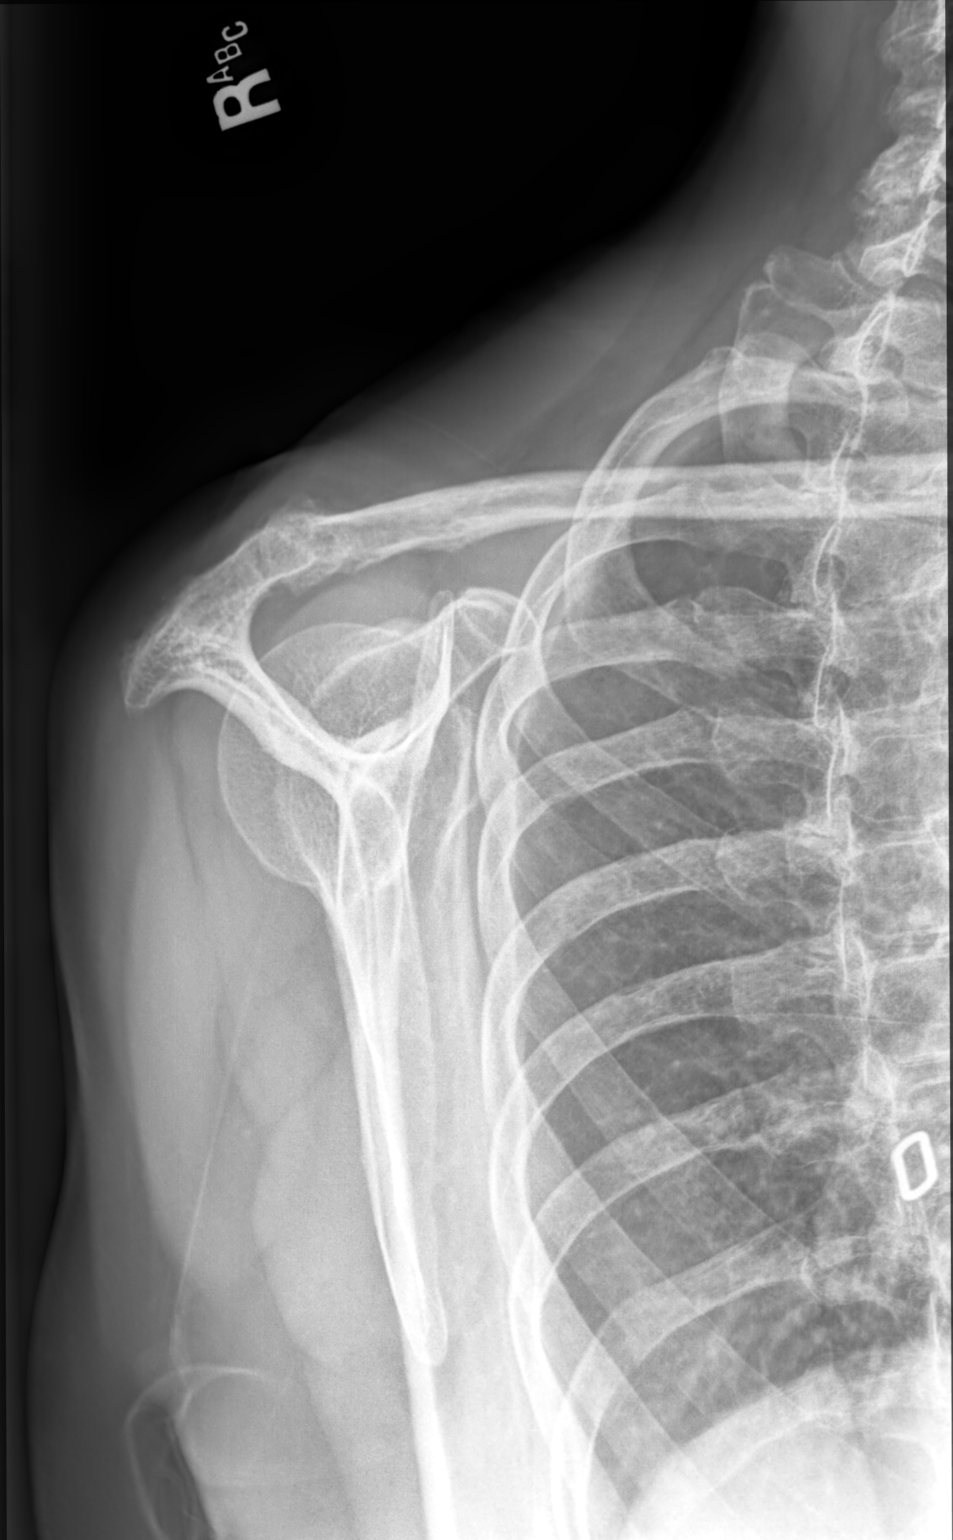

[shoulder axial]
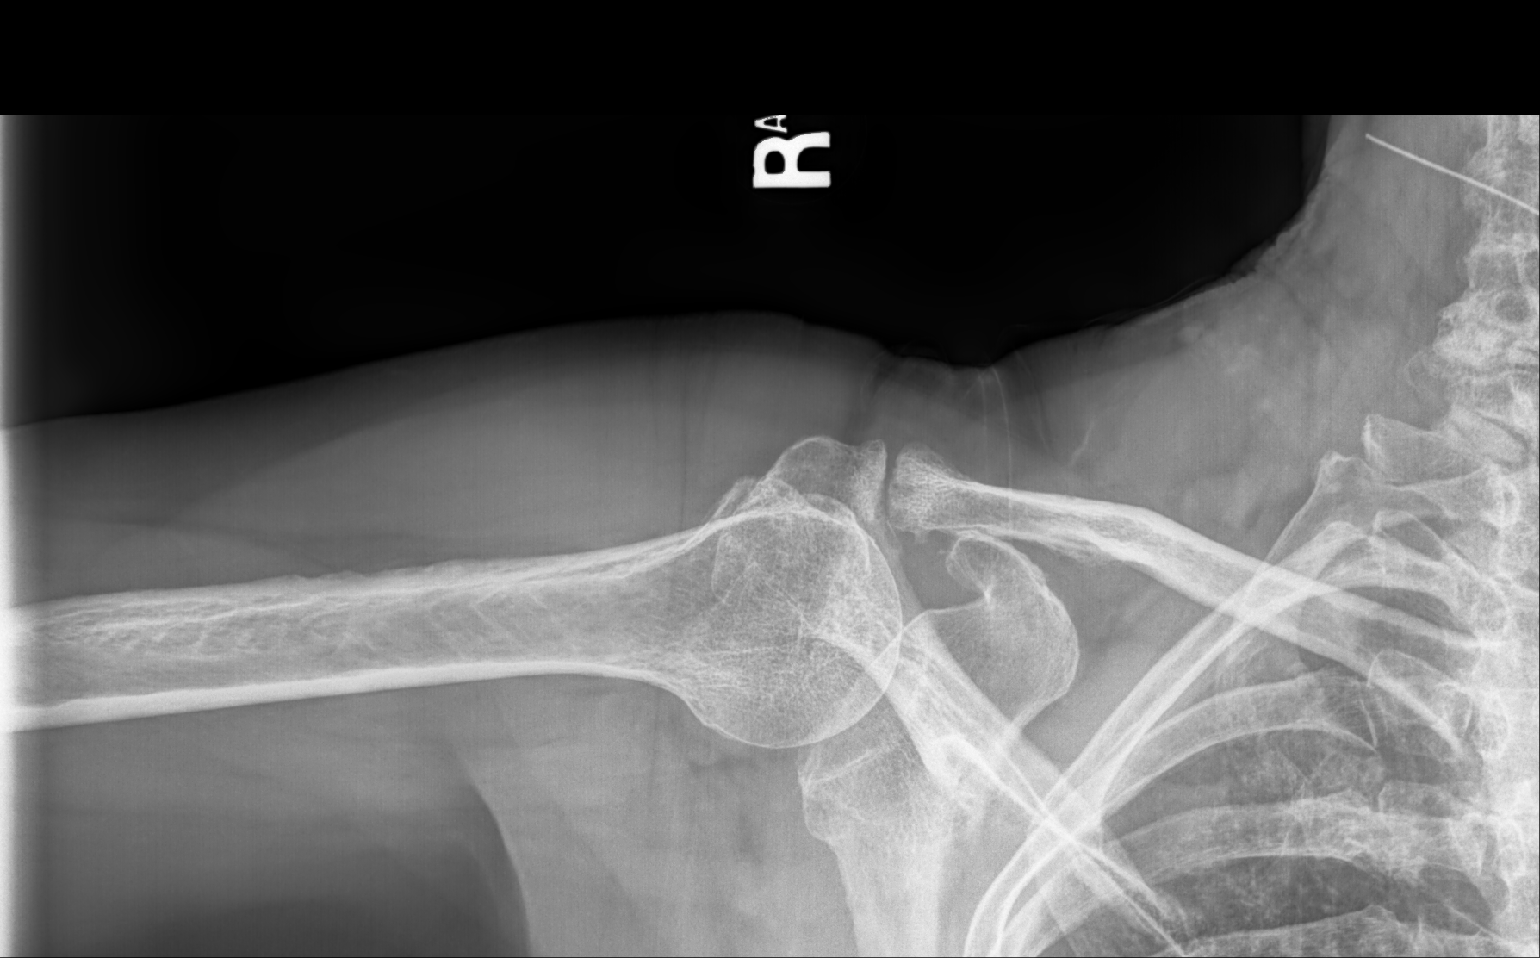

[3 of 3 positions shown; findings below may reference images not displayed]

FINDINGS: Moderate AC joint degenerative changes and mild glenohumeral joint
degenerative changes. No acute bony findings or abnormal soft tissue
calcifications. The visualized right ribs are intact and the
visualized right lung is grossly clear.
IMPRESSION: Degenerative changes but no acute bony findings.

## 2019-09-23 IMAGING — DX DG CERVICAL SPINE COMPLETE 4+V
6 series · 6 of 6 positions shown · non-contrast
Comparison: [DATE]

CLINICAL DATA: Neck pain.

EXAM:
CERVICAL SPINE - COMPLETE 4+ VIEW

[cervical spine lat (1 of 2)]
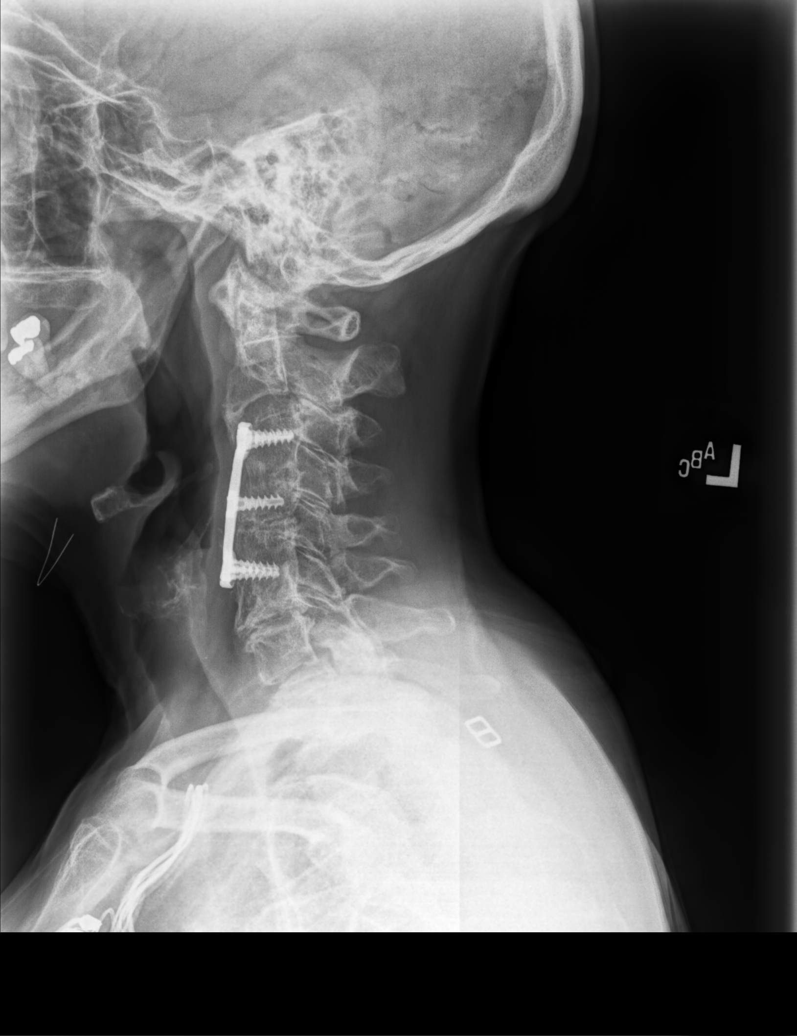

[cervical spine oblique (1 of 2)]
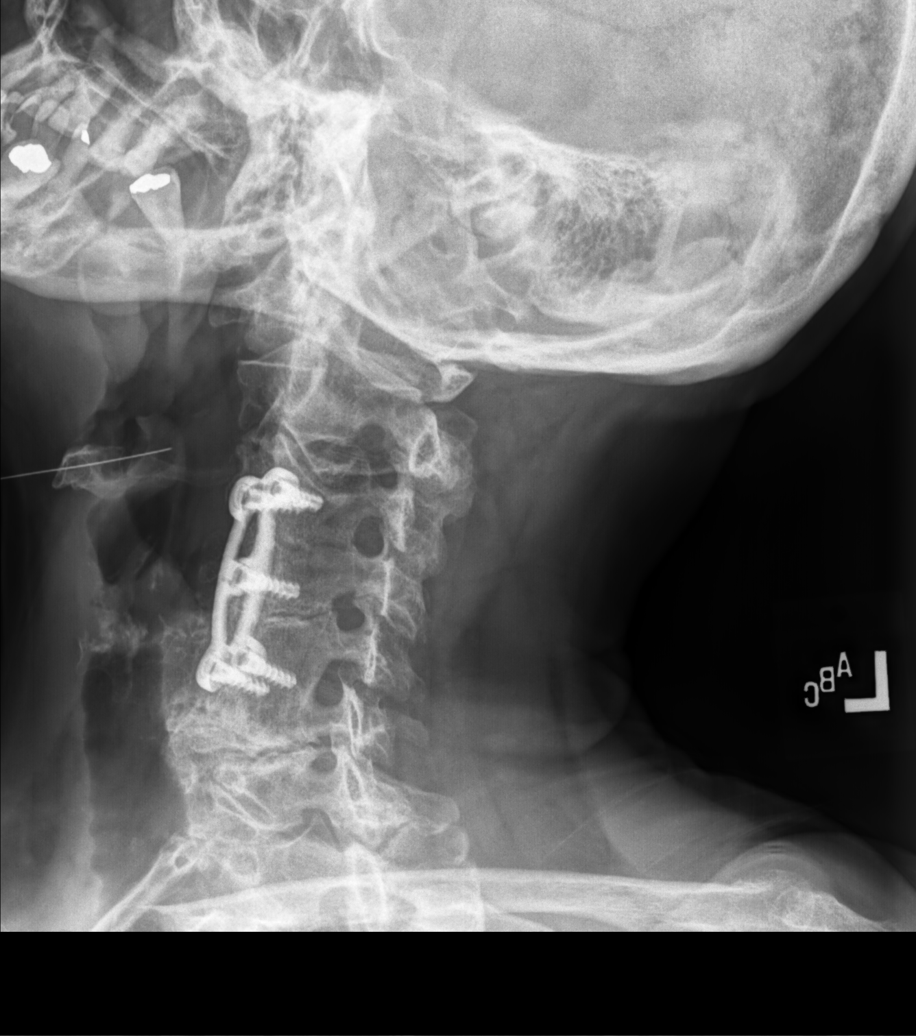

[cervical spine oblique (2 of 2)]
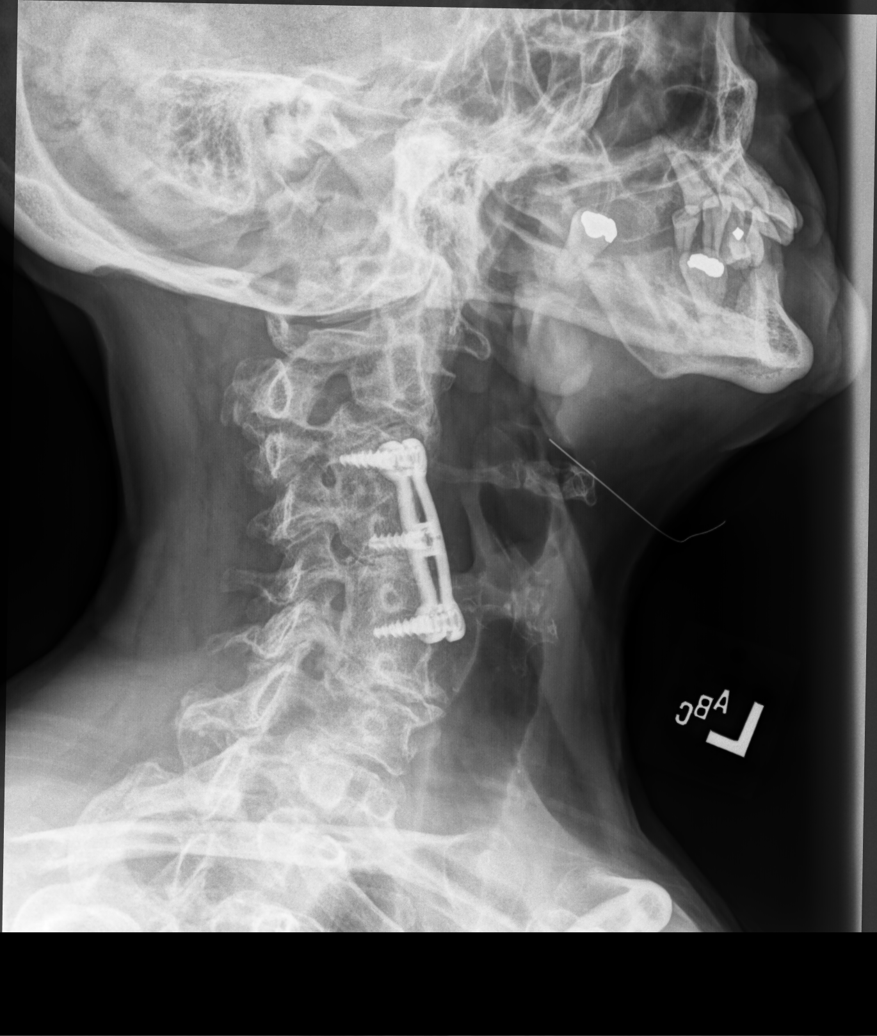

[cervical spine ap (1 of 2)]
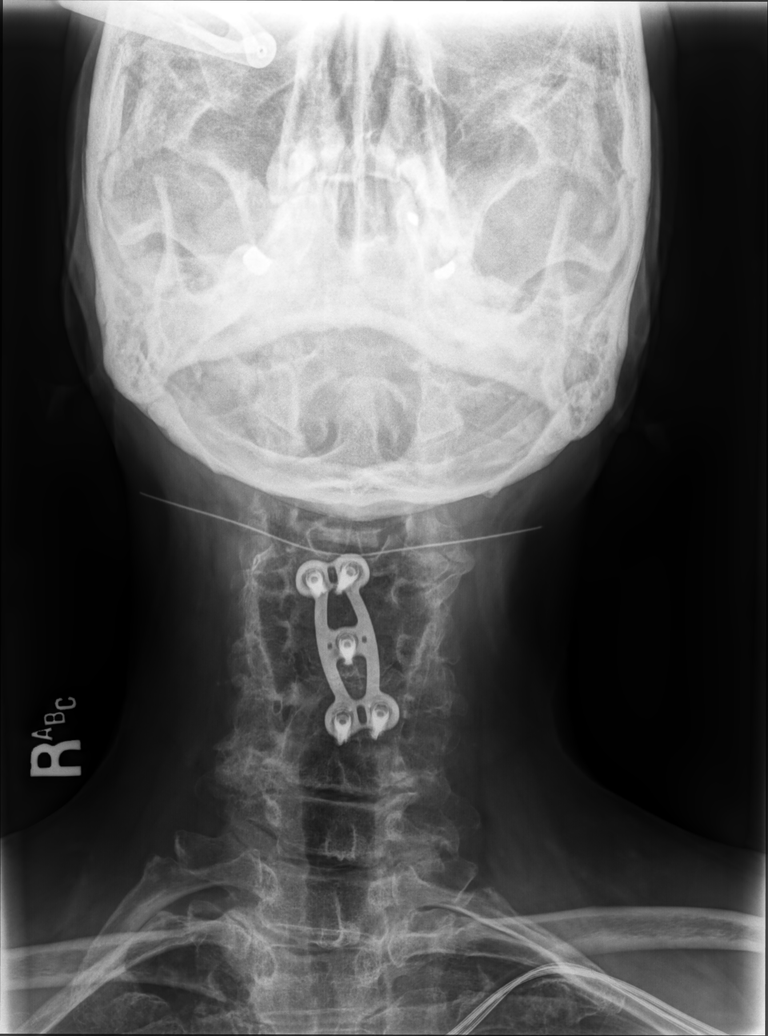

[cervical spine ap (2 of 2)]
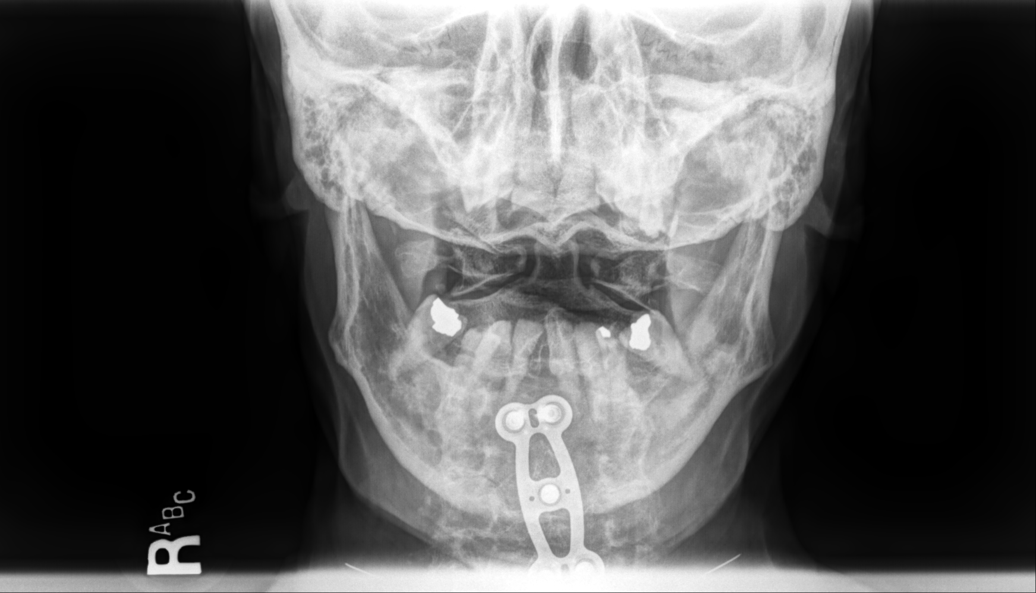

[cervical spine lat (2 of 2)]
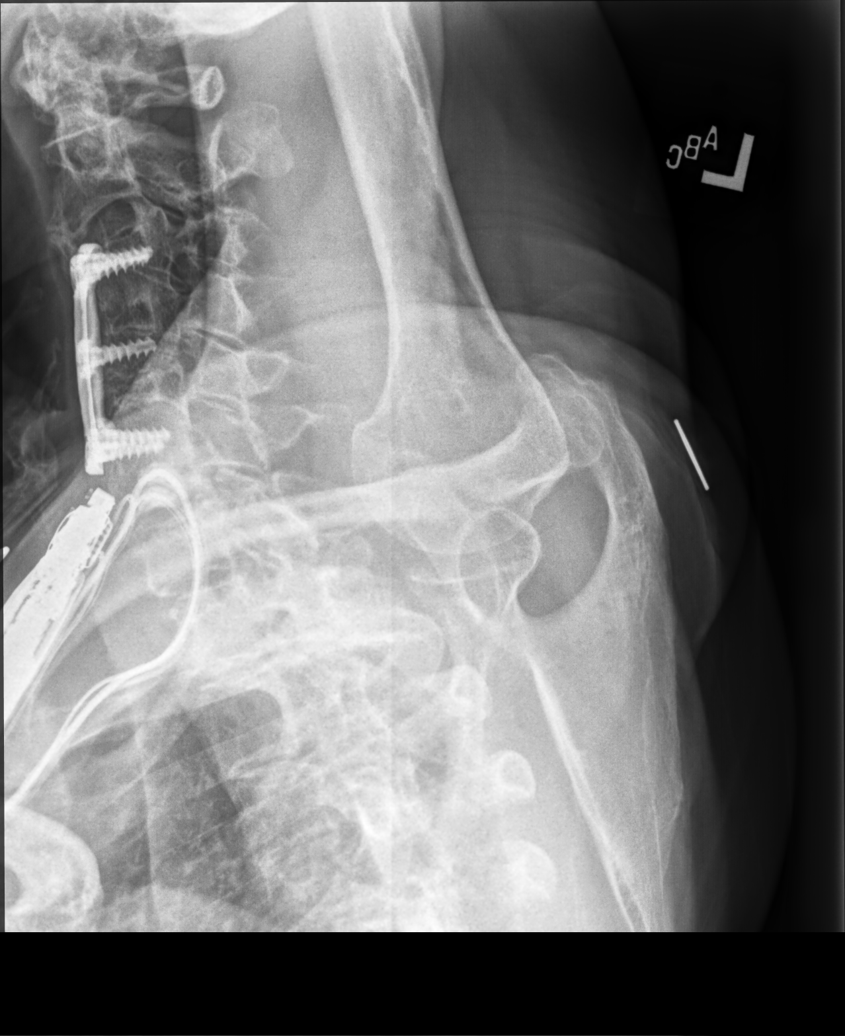

[6 of 6 positions shown; findings below may reference images not displayed]

FINDINGS: Stable anterior and interbody fusion changes at C3-4, C4-5 and C5-6.
Stable advanced degenerative disc disease at C6-7

No acute bony findings or abnormal prevertebral soft tissue
swelling.

Mild multilevel bony foraminal narrowing due to uncinate spurring
and facet disease. No interval change.

The C1-2 articulations are maintained. Moderate degenerative
changes. No dens fracture.

The lung apices are grossly clear.
IMPRESSION: 1. Stable anterior and interbody fusion changes at C3-4, C4-5 and
C5-6.
2. Stable advanced degenerative disc disease at C6-7.
3. Stable multilevel bony foraminal narrowing.

## 2019-09-23 NOTE — Telephone Encounter (Signed)
Patient has called in stating that she is having right shoulder pain that is shooting up to her head.  States this has been going on for three weeks.  States she can not turn her head right and that she is not able to sleep at night.    Patient states she had left side body pain that Dr. [name] was treating.  States this has cleared up.  States she would not take the muscle relaxer's due to feeling drunk.    Patient also states that she has stopped taking the prozac due to feeling more depressed while on it.    I have sent patient over to [facility] for triage.

## 2019-09-23 NOTE — Telephone Encounter (Signed)
Nurse Assessment Nurse: Hermelinda Dellen, RN, Brandi Date/Time (Eastern Time): 09/23/2019 10:44:36 AM Confirm and document reason for call. If symptomatic, describe symptoms. ---PT fell off ladder about 2 months ago and now having right shoulder pain shooting into her head for past 3 weeks, can not turn her head and sleep at night. Reports pain is on the right side. Pain 8(0-10)Constant "Pain goes up the right side of neck in the right side of my head". 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 Has the patient had close contact with a person known or suspected to have the novel coronavirus illness OR traveled / lives in area with major community spread (including international travel) in the last 14 days from the onset of symptoms? * If Asymptomatic, screen for exposure and travel within the last 14 days. ---No Does the patient have any new or worsening symptoms? ---Yes Will a triage be completed? ---Yes Related visit to physician within the last 2 weeks? ---No Does the PT have any chronic conditions? (i.e. diabetes, asthma, this includes High risk factors for pregnancy, etc.) ---Yes List chronic conditions. ---pacemaker cardiac history Is this a behavioral health or substance abuse call? ---NoPLEASE NOTE: All timestamps contained within this report are represented as [place] Standard Time. CONFIDENTIALTY NOTICE: This fax transmission is intended only for the addressee. It contains information that is legally privileged, confidential or otherwise protected from use or disclosure. If you are not the intended recipient, you are strictly prohibited from reviewing, disclosing, copying using or disseminating any of this information or taking any action in reliance on or regarding this information. If you have received this fax in error, please notify us immediately by telephone so that we can arrange for its return to [place]. Phone: [phone], Toll-Free: [phone], Fax:  [phone] Page: 2 of 2 Call Id: [id number] Guidelines Guideline Title Affirmed Question Affirmed Notes Nurse Date/Time Lamount Cohen Time) Shoulder Injury [1] SEVERE pain AND [2] not improved 2 hours after pain medicine/ice packs Hermelinda Dellen, RN, Brandi 09/23/2019 10:47:22 AM Disp. Time Lamount Cohen Time) Disposition Final User 09/23/2019 10:50:51 AM See HCP within 4 Hours (or PCP triage) Yes Hermelinda Dellen, RN, Merry Proud Caller Disagree/Comply Comply Caller Understands Yes PreDisposition Call Doctor Care Advice Given Per Guideline SEE HCP WITHIN 4 HOURS (OR PCP TRIAGE): PAIN MEDICINES: * ACETAMINOPHEN - REGULAR STRENGTH TYLENOL: Take 650 mg (two 325 mg pills) by mouth every 4 to 6 hours as needed. Each Regular Strength Tylenol pill has 325 mg of acetaminophen. The most you should take each day is 3,250 mg (10 pills a day). CALL BACK IF: * You become worse. Comments User: Marinda Elk, RN Date/Time Lamount Cohen Time): 09/23/2019 10:49:03 AM Caller takes blood thinners and has plates in her cervical spine. User: Marinda Elk, RN Date/Time Lamount Cohen Time): 09/23/2019 10:55:39 AM Warm transferred caller to office. Spoke with Judeth Cornfield about an appt or at least and xray order. Caller refuses ED. Referrals REFERRED TO PCP OFFIC

## 2019-09-24 NOTE — Telephone Encounter (Signed)
Patient call back stated stopped taking muscle relaxer medication due to dizziness and fatigue, feeling drunk.  Rx Prozac not helping for depression. Feeling more depress, snappy and  bad mood. Requesting Rx to be change is is possible to Rx Wellbutrin or higher dose of previus medication

## 2019-09-24 NOTE — Progress Notes (Signed)
Please inform patient of the following:  Xrays show arthritis without any fractures or other abnormalities. Recommend f/u OV if symptoms not improving.  Anita Diaz. [name], MD 09/24/2019 9:03 AM

## 2019-09-25 MED ORDER — CYCLOBENZAPRINE HCL 10 MG PO TABS: 10.0000 mg | ORAL_TABLET | Freq: Three times a day (TID) | ORAL | 0 refills | Status: DC | PRN

## 2019-09-25 MED ORDER — BUPROPION HCL ER (XL) 150 MG PO TB24: 150.0000 mg | ORAL_TABLET | Freq: Every day | ORAL | 1 refills | Status: DC

## 2019-09-25 NOTE — Telephone Encounter (Signed)
Spoke to Anita Diaz told her per Dr. [name] I am ok with adding on wellbutrin xl 150mg  daily or increasing prozac to 60mg  daily. Please ask patient which she would prefer. Anita Diaz verbalized understanding and said she does not like the Prozac it makes her depression worse. She would like the Wellbutrin. Told her okay will send to pharmacy. Anita Diaz also said the new muscle relaxer her gave her is too strong, she took before going to work and made her feel real loopy. Anita Diaz said she would like something else. Told Anita Diaz she has a Rx for Flexeril 10 mg that she can take. Anita Diaz said she needs a refill on it. Told Anita Diaz okay will send that also to the pharmacy. Would like for her to follow up with in a few weeks.

## 2019-09-25 NOTE — Telephone Encounter (Signed)
Pt agreed with Rx Wellbutrin Stated stopped Rx Zanaflex due to side effects, Flexeril work for her but not strong enough

## 2019-09-25 NOTE — Telephone Encounter (Signed)
I am ok with adding on wellbutrin xl 150mg  daily or increasing prozac to 60mg  daily. Please ask patient which she would prefer. Would like for her to follow up with in a few weeks.  . [place], MD 09/25/2019 8:41 AM

## 2019-10-20 ENCOUNTER — Other Ambulatory Visit: Payer: Self-pay

## 2019-10-20 ENCOUNTER — Encounter: Payer: Self-pay | Admitting: Family Medicine

## 2019-10-20 ENCOUNTER — Ambulatory Visit (INDEPENDENT_AMBULATORY_CARE_PROVIDER_SITE_OTHER): Payer: BC Managed Care – PPO | Admitting: Family Medicine

## 2019-10-20 VITALS — BP 122/72 | HR 60 | Temp 97.0°F | Ht 62.0 in | Wt 133.0 lb

## 2019-10-20 DIAGNOSIS — I10 Essential (primary) hypertension: Secondary | ICD-10-CM

## 2019-10-20 DIAGNOSIS — N3941 Urge incontinence: Secondary | ICD-10-CM

## 2019-10-20 DIAGNOSIS — F321 Major depressive disorder, single episode, moderate: Secondary | ICD-10-CM | POA: Diagnosis not present

## 2019-10-20 LAB — URINALYSIS, ROUTINE W REFLEX MICROSCOPIC
Bilirubin Urine: NEGATIVE
Hgb urine dipstick: NEGATIVE
Ketones, ur: NEGATIVE
Nitrite: NEGATIVE
RBC / HPF: NONE SEEN (ref 0–?)
Specific Gravity, Urine: 1.01 (ref 1.000–1.030)
Total Protein, Urine: NEGATIVE
Urine Glucose: NEGATIVE
Urobilinogen, UA: 0.2 (ref 0.0–1.0)
pH: 6 (ref 5.0–8.0)

## 2019-10-20 MED ORDER — BUPROPION HCL ER (XL) 150 MG PO TB24: 150.0000 mg | ORAL_TABLET | Freq: Every day | ORAL | 3 refills | Status: DC

## 2019-10-20 NOTE — Assessment & Plan Note (Signed)
Doing much better with Wellbutrin 150 mg daily.  We will continue this.

## 2019-10-20 NOTE — Patient Instructions (Signed)
It was very nice to see you today!  I am glad that you are feeling better.  I will refill your Wellbutrin.  Your likely having overactive bladder.  We will check a urine sample to make sure there is nothing else going on.  Please work on the Kegel exercises let me know if your symptoms are not improving.  Take care, Dr [name]  Please try these tips to maintain a healthy lifestyle:   Eat at least 3 REAL meals and 1-2 snacks per day.  Aim for no more than 5 hours between eating.  If you eat breakfast, please do so within one hour of getting up.    Each meal should contain half fruits/vegetables, one quarter protein, and one quarter carbs (no bigger than a computer mouse)   Cut down on sweet beverages. This includes juice, soda, and sweet tea.     Drink at least 1 glass of water with each meal and aim for at least 8 glasses per day   Exercise at least 150 minutes every week.    Kegel Exercises  Kegel exercises can help strengthen your pelvic floor muscles. The pelvic floor is a group of muscles that support your rectum, small intestine, and bladder. In females, pelvic floor muscles also help support the womb (uterus). These muscles help you control the flow of urine and stool. Kegel exercises are painless and simple, and they do not require any equipment. Your provider may suggest Kegel exercises to:  Improve bladder and bowel control.  Improve sexual response.  Improve weak pelvic floor muscles after surgery to remove the uterus (hysterectomy) or pregnancy (females).  Improve weak pelvic floor muscles after prostate gland removal or surgery (males). Kegel exercises involve squeezing your pelvic floor muscles, which are the same muscles you squeeze when you try to stop the flow of urine or keep from passing gas. The exercises can be done while sitting, standing, or lying down, but it is best to vary your position. Exercises How to do Kegel exercises: 1. Squeeze your pelvic  floor muscles tight. You should feel a tight lift in your rectal area. If you are a female, you should also feel a tightness in your vaginal area. Keep your stomach, buttocks, and legs relaxed. 2. Hold the muscles tight for up to 10 seconds. 3. Breathe normally. 4. Relax your muscles. 5. Repeat as told by your health care provider. Repeat this exercise daily as told by your health care provider. Continue to do this exercise for at least 4-6 weeks, or for as long as told by your health care provider. You may be referred to a physical therapist who can help you learn more about how to do Kegel exercises. Depending on your condition, your health care provider may recommend:  Varying how long you squeeze your muscles.  Doing several sets of exercises every day.  Doing exercises for several weeks.  Making Kegel exercises a part of your regular exercise routine. This information is not intended to replace advice given to you by your health care provider. Make sure you discuss any questions you have with your health care provider. Document Revised: 11/28/2017 Document Reviewed: 11/28/2017 Elsevier Patient Education  2020 [employer].

## 2019-10-20 NOTE — Addendum Note (Signed)
Addended by: Laddie Aquas A on: 10/20/2019 01:31 PM   Modules accepted: Orders

## 2019-10-20 NOTE — Progress Notes (Signed)
   Anita Diaz is a 64 y.o. female who presents today for an office visit.  Assessment/Plan:  Chronic Problems Addressed Today: Hypertension At goal today.  Continue current dose of Coreg and Entresto.  Depression, major, single episode, moderate (HCC) Doing much better with Wellbutrin 150 mg daily.  We will continue this.  Urge incontinence We will check urine culture to rule out UTI.  Also check UA.  Does not want start medication at this point.  Discussed Kegel exercises and handout was given.      Subjective:  HPI:  Patient here for depression follow-up.  She was last seen a few months ago.  Was doing well Prozac at that time.  Shortly afterwards she noticed Prozac was less effective.  4 weeks ago we switched her to Wellbutrin 150 mg daily.  She stopped taking Prozac at home.  She has done much better.  Depression symptoms are much improved.  She is also had increasing issues with urinary urgency lately.  Sometimes will have a sensation that she needs to urinate but nothing will come out.  There are times she will have sudden urge and have a little bit of incontinence as well.  No dysuria.  No hematuria.       Objective:  Physical Exam: BP 122/72   Pulse 60   Temp (!) 97 F (36.1 C)   Ht 5\' 2"  (1.575 m)   Wt 133 lb (60.3 kg)   SpO2 100%   BMI 24.33 kg/m   Gen: No acute distress, resting comfortably CV: Regular rate and rhythm with no murmurs appreciated Pulm: Normal work of breathing, clear to auscultation bilaterally with no crackles, wheezes, or rhonchi Neuro: Grossly normal, moves all extremities Psych: Normal affect and thought content      Sherry Rogus M. , MD 10/20/2019 11:03 AM

## 2019-10-20 NOTE — Assessment & Plan Note (Signed)
We will check urine culture to rule out UTI.  Also check UA.  Does not want start medication at this point.  Discussed Kegel exercises and handout was given.

## 2019-10-20 NOTE — Assessment & Plan Note (Signed)
At goal today.  Continue current dose of Coreg and Entresto.

## 2019-10-21 LAB — URINE CULTURE
MICRO NUMBER:: 10641919
Result:: NO GROWTH
SPECIMEN QUALITY:: ADEQUATE
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
## 2019-10-22 NOTE — Progress Notes (Signed)
Please inform patient of the following:  Urine culture negative - she does not have a UTI.

## 2019-10-27 ENCOUNTER — Other Ambulatory Visit: Payer: Self-pay | Admitting: Internal Medicine

## 2019-11-03 ENCOUNTER — Ambulatory Visit: Payer: BC Managed Care – PPO | Admitting: Nurse Practitioner

## 2019-11-03 ENCOUNTER — Encounter: Payer: Self-pay | Admitting: Nurse Practitioner

## 2019-11-03 ENCOUNTER — Other Ambulatory Visit: Payer: Self-pay

## 2019-11-03 VITALS — BP 102/66 | HR 60 | Ht 62.0 in | Wt 136.4 lb

## 2019-11-03 DIAGNOSIS — I428 Other cardiomyopathies: Secondary | ICD-10-CM

## 2019-11-03 DIAGNOSIS — Z95810 Presence of automatic (implantable) cardiac defibrillator: Secondary | ICD-10-CM | POA: Diagnosis not present

## 2019-11-03 DIAGNOSIS — I447 Left bundle-branch block, unspecified: Secondary | ICD-10-CM | POA: Diagnosis not present

## 2019-11-03 DIAGNOSIS — I10 Essential (primary) hypertension: Secondary | ICD-10-CM

## 2019-11-03 DIAGNOSIS — I5022 Chronic systolic (congestive) heart failure: Secondary | ICD-10-CM | POA: Diagnosis not present

## 2019-11-03 LAB — BASIC METABOLIC PANEL
BUN/Creatinine Ratio: 20 (ref 12–28)
BUN: 17 mg/dL (ref 8–27)
CO2: 25 mmol/L (ref 20–29)
Calcium: 9.2 mg/dL (ref 8.7–10.3)
Chloride: 104 mmol/L (ref 96–106)
Creatinine, Ser: 0.87 mg/dL (ref 0.57–1.00)
GFR calc Af Amer: 82 mL/min/{1.73_m2} (ref >59)
GFR calc non Af Amer: 71 mL/min/{1.73_m2} (ref >59)
Glucose: 104 mg/dL — ABNORMAL HIGH (ref 65–99)
Potassium: 4.4 mmol/L (ref 3.5–5.2)
Sodium: 141 mmol/L (ref 134–144)

## 2019-11-03 MED ORDER — ENTRESTO 24-26 MG PO TABS: 1.0000 | ORAL_TABLET | Freq: Two times a day (BID) | ORAL | 3 refills | Status: DC

## 2019-11-03 NOTE — Patient Instructions (Addendum)
After Visit Summary:  We will be checking the following labs today - BMET   Medication Instructions:    Continue with your current medicines.   I refilled the Entresto for you today.    If you need a refill on your cardiac medications before your next appointment, please call your pharmacy.     Testing/Procedures To Be Arranged:  N/A  Follow-Up:   See Dr. Anne Fu in 6 months  To see Dr. Ladona Ridgel in October.     At [facility], you and your health needs are our priority.  As part of our continuing mission to provide you with exceptional heart care, we have created designated Provider Care Teams.  These Care Teams include your primary Cardiologist (physician) and Advanced Practice Providers (APPs -  Physician Assistants and Nurse Practitioners) who all work together to provide you with the care you need, when you need it.  Special Instructions:  . Stay safe, wash your hands for at least 20 seconds and wear a mask when needed.  . It was good to talk with you today.    Call the [facility] office at [phone] if you have any questions, problems or concerns.

## 2019-11-07 ENCOUNTER — Other Ambulatory Visit: Payer: Self-pay

## 2019-11-07 ENCOUNTER — Telehealth: Payer: Self-pay

## 2019-11-07 MED ORDER — BUPROPION HCL ER (XL) 300 MG PO TB24: 300.0000 mg | ORAL_TABLET | Freq: Every day | ORAL | 1 refills | Status: DC

## 2019-11-07 NOTE — Telephone Encounter (Signed)
Please advise 

## 2019-11-07 NOTE — Telephone Encounter (Signed)
Patient notified ok to increase ,Rx sent in

## 2019-11-07 NOTE — Telephone Encounter (Signed)
Ok to increase to 300mg  daily.

## 2019-11-07 NOTE — Telephone Encounter (Signed)
Pt would like to know if her Welbutrin can be increased

## 2019-11-17 ENCOUNTER — Other Ambulatory Visit: Payer: Self-pay | Admitting: Internal Medicine

## 2019-12-11 ENCOUNTER — Ambulatory Visit (INDEPENDENT_AMBULATORY_CARE_PROVIDER_SITE_OTHER): Payer: BC Managed Care – PPO | Admitting: *Deleted

## 2019-12-11 DIAGNOSIS — I428 Other cardiomyopathies: Secondary | ICD-10-CM | POA: Diagnosis not present

## 2019-12-12 LAB — CUP PACEART REMOTE DEVICE CHECK
Battery Remaining Longevity: 132 mo
Battery Remaining Percentage: 100 %
Brady Statistic RA Percent Paced: 6 %
Brady Statistic RV Percent Paced: 0 %
Date Time Interrogation Session: 20210819044100
HighPow Impedance: 78 Ohm
Implantable Lead Implant Date: 20190718
Implantable Lead Implant Date: 20190718
Implantable Lead Implant Date: 20190718
Implantable Lead Location: 753858
Implantable Lead Location: 753859
Implantable Lead Location: 753860
Implantable Lead Model: 292
Implantable Lead Model: 4671
Implantable Lead Model: 7740
Implantable Lead Serial Number: 447121
Implantable Lead Serial Number: 694576
Implantable Lead Serial Number: 815914
Implantable Pulse Generator Implant Date: 20190718
Lead Channel Impedance Value: 503 Ohm
Lead Channel Impedance Value: 527 Ohm
Lead Channel Impedance Value: 591 Ohm
Lead Channel Setting Pacing Amplitude: 2 V
Lead Channel Setting Pacing Amplitude: 2.4 V
Lead Channel Setting Pacing Amplitude: 2.6 V
Lead Channel Setting Pacing Pulse Width: 0.4 ms
Lead Channel Setting Pacing Pulse Width: 0.4 ms
Lead Channel Setting Sensing Sensitivity: 0.5 mV
Lead Channel Setting Sensing Sensitivity: 1 mV
Pulse Gen Serial Number: 215046

## 2019-12-15 NOTE — Progress Notes (Signed)
Remote ICD transmission.   

## 2020-02-09 ENCOUNTER — Other Ambulatory Visit: Payer: Self-pay | Admitting: Internal Medicine

## 2020-02-13 ENCOUNTER — Ambulatory Visit: Payer: BC Managed Care – PPO | Admitting: Internal Medicine

## 2020-02-13 ENCOUNTER — Encounter: Payer: Self-pay | Admitting: Internal Medicine

## 2020-02-13 ENCOUNTER — Other Ambulatory Visit: Payer: Self-pay

## 2020-02-13 ENCOUNTER — Encounter (INDEPENDENT_AMBULATORY_CARE_PROVIDER_SITE_OTHER): Payer: Self-pay

## 2020-02-13 VITALS — BP 106/70 | HR 63 | Ht 62.0 in | Wt 133.2 lb

## 2020-02-13 DIAGNOSIS — I5022 Chronic systolic (congestive) heart failure: Secondary | ICD-10-CM | POA: Diagnosis not present

## 2020-02-13 DIAGNOSIS — I447 Left bundle-branch block, unspecified: Secondary | ICD-10-CM | POA: Diagnosis not present

## 2020-02-13 DIAGNOSIS — I471 Supraventricular tachycardia: Secondary | ICD-10-CM | POA: Diagnosis not present

## 2020-02-13 DIAGNOSIS — Z95810 Presence of automatic (implantable) cardiac defibrillator: Secondary | ICD-10-CM | POA: Diagnosis not present

## 2020-02-13 NOTE — Patient Instructions (Signed)
Medication Instructions:  Your physician recommends that you continue on your current medications as directed. Please refer to the Current Medication list given to you today.  Labwork: None ordered.  Testing/Procedures: None ordered.  Follow-Up: Your physician wants you to follow-up in: one year with Dr. Ladona Ridgel.   You will receive a reminder letter in the mail two months in advance. If you don't receive a letter, please call our office to schedule the follow-up appointment.  Remote monitoring is used to monitor your ICD from home. This monitoring reduces the number of office visits required to check your device to one time per year. It allows [place] to keep an eye on the functioning of your device to ensure it is working properly. You are scheduled for a device check from home on 03/11/2020. You may send your transmission at any time that day. If you have a wireless device, the transmission will be sent automatically. After your physician reviews your transmission, you will receive a postcard with your next transmission date.  Any Other Special Instructions Will Be Listed Below (If Applicable).  If you need a refill on your cardiac medications before your next appointment, please call your pharmacy.
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
## 2020-03-11 ENCOUNTER — Ambulatory Visit (INDEPENDENT_AMBULATORY_CARE_PROVIDER_SITE_OTHER): Payer: BC Managed Care – PPO

## 2020-03-11 DIAGNOSIS — I428 Other cardiomyopathies: Secondary | ICD-10-CM | POA: Diagnosis not present

## 2020-03-13 LAB — CUP PACEART REMOTE DEVICE CHECK
Battery Remaining Longevity: 132 mo
Battery Remaining Percentage: 100 %
Brady Statistic RA Percent Paced: 8 %
Brady Statistic RV Percent Paced: 0 %
Date Time Interrogation Session: 20211119191100
HighPow Impedance: 71 Ohm
Implantable Lead Implant Date: 20190718
Implantable Lead Implant Date: 20190718
Implantable Lead Implant Date: 20190718
Implantable Lead Location: 753858
Implantable Lead Location: 753859
Implantable Lead Location: 753860
Implantable Lead Model: 292
Implantable Lead Model: 4671
Implantable Lead Model: 7740
Implantable Lead Serial Number: 447121
Implantable Lead Serial Number: 694576
Implantable Lead Serial Number: 815914
Implantable Pulse Generator Implant Date: 20190718
Lead Channel Impedance Value: 461 Ohm
Lead Channel Impedance Value: 537 Ohm
Lead Channel Impedance Value: 572 Ohm
Lead Channel Setting Pacing Amplitude: 2 V
Lead Channel Setting Pacing Amplitude: 2.4 V
Lead Channel Setting Pacing Amplitude: 2.6 V
Lead Channel Setting Pacing Pulse Width: 0.4 ms
Lead Channel Setting Pacing Pulse Width: 0.4 ms
Lead Channel Setting Sensing Sensitivity: 0.5 mV
Lead Channel Setting Sensing Sensitivity: 1 mV
Pulse Gen Serial Number: 215046

## 2020-03-15 NOTE — Progress Notes (Signed)
Remote ICD transmission.   

## 2020-04-30 ENCOUNTER — Telehealth: Payer: Self-pay

## 2020-04-30 NOTE — Telephone Encounter (Signed)
No calls from this office

## 2020-04-30 NOTE — Telephone Encounter (Signed)
Patient states she had a missed call from someone at the office and wondered if someone was trying to reach her.

## 2020-05-07 ENCOUNTER — Encounter: Payer: Self-pay | Admitting: Cardiology

## 2020-05-07 ENCOUNTER — Ambulatory Visit: Payer: Medicare Other | Admitting: Cardiology

## 2020-05-07 ENCOUNTER — Other Ambulatory Visit: Payer: Self-pay

## 2020-05-07 VITALS — BP 110/60 | HR 56 | Ht 62.0 in | Wt 133.0 lb

## 2020-05-07 DIAGNOSIS — E785 Hyperlipidemia, unspecified: Secondary | ICD-10-CM

## 2020-05-07 DIAGNOSIS — I428 Other cardiomyopathies: Secondary | ICD-10-CM

## 2020-05-07 DIAGNOSIS — I10 Essential (primary) hypertension: Secondary | ICD-10-CM | POA: Diagnosis not present
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
## 2020-05-07 NOTE — Patient Instructions (Signed)
Medication Instructions:  No changes *If you need a refill on your cardiac medications before your next appointment, please call your pharmacy*   Lab Work: Bmet, CBC If you have labs (blood work) drawn today and your tests are completely normal, you will receive your results only by: Marland Kitchen MyChart Message (if you have MyChart) OR . A paper copy in the mail If you have any lab test that is abnormal or we need to change your treatment, we will call you to review the results.   Testing/Procedures: please schedule echo Your physician has requested that you have an echocardiogram. Echocardiography is a painless test that uses sound waves to create images of your heart. It provides your doctor with information about the size and shape of your heart and how well your heart's chambers and valves are working. This procedure takes approximately one hour. There are no restrictions for this procedure.    Follow-Up: At [facility], you and your health needs are our priority.  As part of our continuing mission to provide you with exceptional heart care, we have created designated Provider Care Teams.  These Care Teams include your primary Cardiologist (physician) and Advanced Practice Providers (APPs -  Physician Assistants and Nurse Practitioners) who all work together to provide you with the care you need, when you need it.  We recommend signing up for the patient portal called "MyChart".  Sign up information is provided on this After Visit Summary.  MyChart is used to connect with patients for Virtual Visits (Telemedicine).  Patients are able to view lab/test results, encounter notes, upcoming appointments, etc.  Non-urgent messages can be sent to your provider as well.   To learn more about what you can do with MyChart, go to [email or web address].    Your next appointment:   6 month(s)  The format for your next appointment:   In Person  Provider:   Donato Schultz, MD   Other  Instructions none

## 2020-05-08 LAB — BASIC METABOLIC PANEL
BUN/Creatinine Ratio: 15 (ref 12–28)
BUN: 16 mg/dL (ref 8–27)
CO2: 25 mmol/L (ref 20–29)
Calcium: 9.5 mg/dL (ref 8.7–10.3)
Chloride: 101 mmol/L (ref 96–106)
Creatinine, Ser: 1.05 mg/dL — ABNORMAL HIGH (ref 0.57–1.00)
GFR calc Af Amer: 65 mL/min/{1.73_m2} (ref >59)
GFR calc non Af Amer: 56 mL/min/{1.73_m2} — ABNORMAL LOW (ref >59)
Glucose: 109 mg/dL — ABNORMAL HIGH (ref 65–99)
Potassium: 4.5 mmol/L (ref 3.5–5.2)
Sodium: 140 mmol/L (ref 134–144)

## 2020-05-08 LAB — CBC
Hematocrit: 37.3 % (ref 34.0–46.6)
Hemoglobin: 12.4 g/dL (ref 11.1–15.9)
MCH: 28.5 pg (ref 26.6–33.0)
MCHC: 33.2 g/dL (ref 31.5–35.7)
MCV: 86 fL (ref 79–97)
Platelets: 377 10*3/uL (ref 150–450)
RBC: 4.35 x10E6/uL (ref 3.77–5.28)
RDW: 13 % (ref 11.7–15.4)
WBC: 7.9 10*3/uL (ref 3.4–10.8)

## 2020-05-14 ENCOUNTER — Other Ambulatory Visit: Payer: Self-pay | Admitting: Family Medicine

## 2020-05-17 ENCOUNTER — Telehealth: Payer: Self-pay | Admitting: Cardiology

## 2020-05-17 NOTE — Telephone Encounter (Signed)
Pt c/o medication issue:  1. Name of Medication: sacubitril-valsartan (ENTRESTO) 24-26 MG  2. How are you currently taking this medication (dosage and times per day)? 1 tablet twice a day  3. Are you having a reaction (difficulty breathing--STAT)? no  4. What is your medication issue? Patient calling for a coupon for the medication.

## 2020-05-17 NOTE — Telephone Encounter (Signed)
Spoke with pt who is reporting her Anita Diaz is now going to be $120 for 90 day supply and she can not afford this.  She does have 2 insurances.  She doesn't know if she is paying her deductible for medications up front or if it through the year.   Advised I will leave a co-pay at the front desk and she can try to use it though doubtful.  Also gave her the [phone] for [employer] Pt assistance program.  She was grateful for the call back and information.  She will pick up the card from the front desk when she comes in Friday for her 2 D Echo.

## 2020-05-21 ENCOUNTER — Ambulatory Visit (HOSPITAL_COMMUNITY): Payer: Medicare Other | Attending: Cardiology

## 2020-05-21 ENCOUNTER — Other Ambulatory Visit: Payer: Self-pay

## 2020-05-21 DIAGNOSIS — I10 Essential (primary) hypertension: Secondary | ICD-10-CM | POA: Diagnosis not present

## 2020-05-21 DIAGNOSIS — E785 Hyperlipidemia, unspecified: Secondary | ICD-10-CM | POA: Diagnosis not present

## 2020-05-21 DIAGNOSIS — I428 Other cardiomyopathies: Secondary | ICD-10-CM | POA: Insufficient documentation

## 2020-05-21 LAB — ECHOCARDIOGRAM COMPLETE
Area-P 1/2: 2.45 cm2
S' Lateral: 3 cm

## 2020-05-30 ENCOUNTER — Other Ambulatory Visit: Payer: Self-pay | Admitting: Internal Medicine

## 2020-06-02 ENCOUNTER — Other Ambulatory Visit: Payer: Self-pay

## 2020-06-02 MED ORDER — CARVEDILOL 12.5 MG PO TABS
ORAL_TABLET | ORAL | 2 refills | Status: DC
Start: 2020-06-02 — End: 2021-03-02

## 2020-06-02 NOTE — Telephone Encounter (Signed)
Pt's medication was sent to pt's pharmacy as requested. Confirmation received.  °

## 2020-06-10 ENCOUNTER — Ambulatory Visit (INDEPENDENT_AMBULATORY_CARE_PROVIDER_SITE_OTHER): Payer: Medicare Other

## 2020-06-10 DIAGNOSIS — I428 Other cardiomyopathies: Secondary | ICD-10-CM

## 2020-06-10 LAB — CUP PACEART REMOTE DEVICE CHECK
Battery Remaining Longevity: 126 mo
Battery Remaining Percentage: 100 %
Brady Statistic RA Percent Paced: 8 %
Brady Statistic RV Percent Paced: 0 %
Date Time Interrogation Session: 20220217044100
HighPow Impedance: 76 Ohm
Implantable Lead Implant Date: 20190718
Implantable Lead Implant Date: 20190718
Implantable Lead Implant Date: 20190718
Implantable Lead Location: 753858
Implantable Lead Location: 753859
Implantable Lead Location: 753860
Implantable Lead Model: 292
Implantable Lead Model: 4671
Implantable Lead Model: 7740
Implantable Lead Serial Number: 447121
Implantable Lead Serial Number: 694576
Implantable Lead Serial Number: 815914
Implantable Pulse Generator Implant Date: 20190718
Lead Channel Impedance Value: 449 Ohm
Lead Channel Impedance Value: 522 Ohm
Lead Channel Impedance Value: 527 Ohm
Lead Channel Setting Pacing Amplitude: 2 V
Lead Channel Setting Pacing Amplitude: 2.4 V
Lead Channel Setting Pacing Amplitude: 2.6 V
Lead Channel Setting Pacing Pulse Width: 0.4 ms
Lead Channel Setting Pacing Pulse Width: 0.4 ms
Lead Channel Setting Sensing Sensitivity: 0.5 mV
Lead Channel Setting Sensing Sensitivity: 1 mV
Pulse Gen Serial Number: 215046

## 2020-06-14 ENCOUNTER — Ambulatory Visit
Admission: RE | Admit: 2020-06-14 | Discharge: 2020-06-14 | Disposition: A | Discharge status: Home / Self Care | Payer: BC Managed Care – PPO | Source: Ambulatory Visit | Attending: Family Medicine | Admitting: Family Medicine

## 2020-06-14 ENCOUNTER — Other Ambulatory Visit: Payer: Self-pay

## 2020-06-14 DIAGNOSIS — Z1231 Encounter for screening mammogram for malignant neoplasm of breast: Secondary | ICD-10-CM

## 2020-06-14 IMAGING — MG MM DIGITAL SCREENING BILAT W/ TOMO AND CAD
6 of 10 series · 6 of 30 positions shown · non-contrast
Comparison: Previous exam(s).

CLINICAL DATA: Screening.

EXAM:
DIGITAL SCREENING BILATERAL MAMMOGRAM WITH TOMOSYNTHESIS AND CAD
TECHNIQUE: Bilateral screening digital craniocaudal and mediolateral oblique
mammograms were obtained. Bilateral screening digital breast
tomosynthesis was performed. The images were evaluated with
computer-aided detection.

[R CC synth-2D]
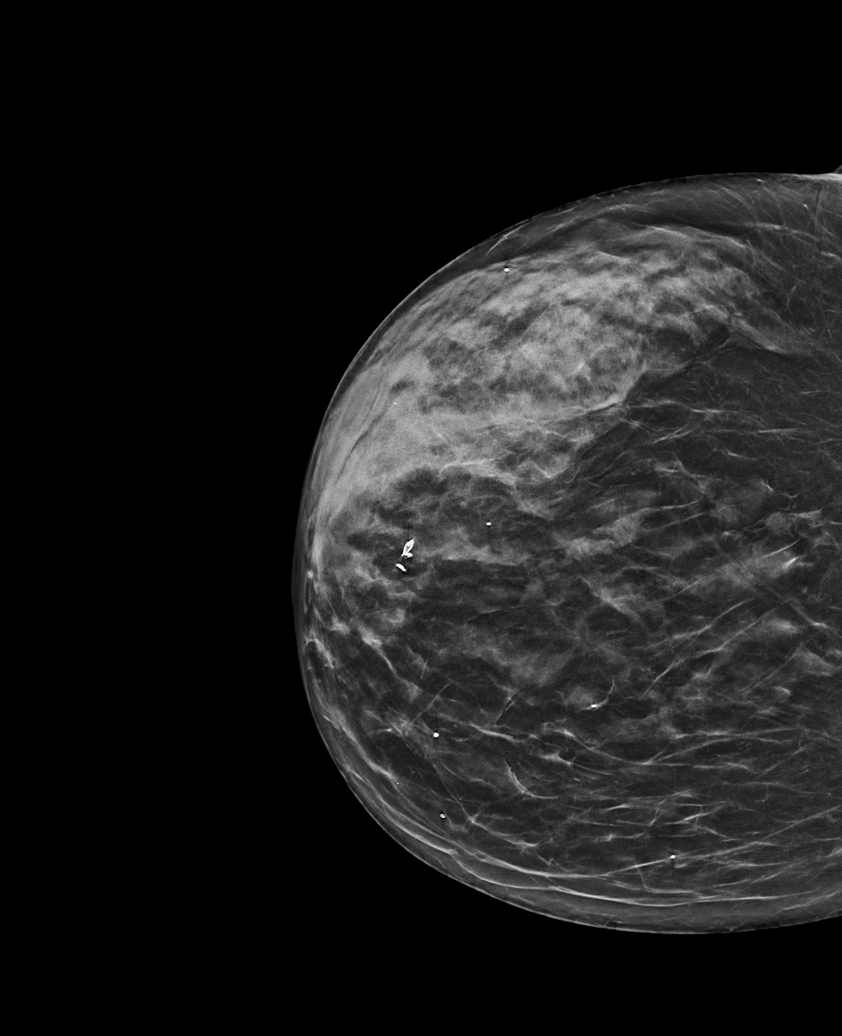

[L MLO synth-2D (1 of 2)]
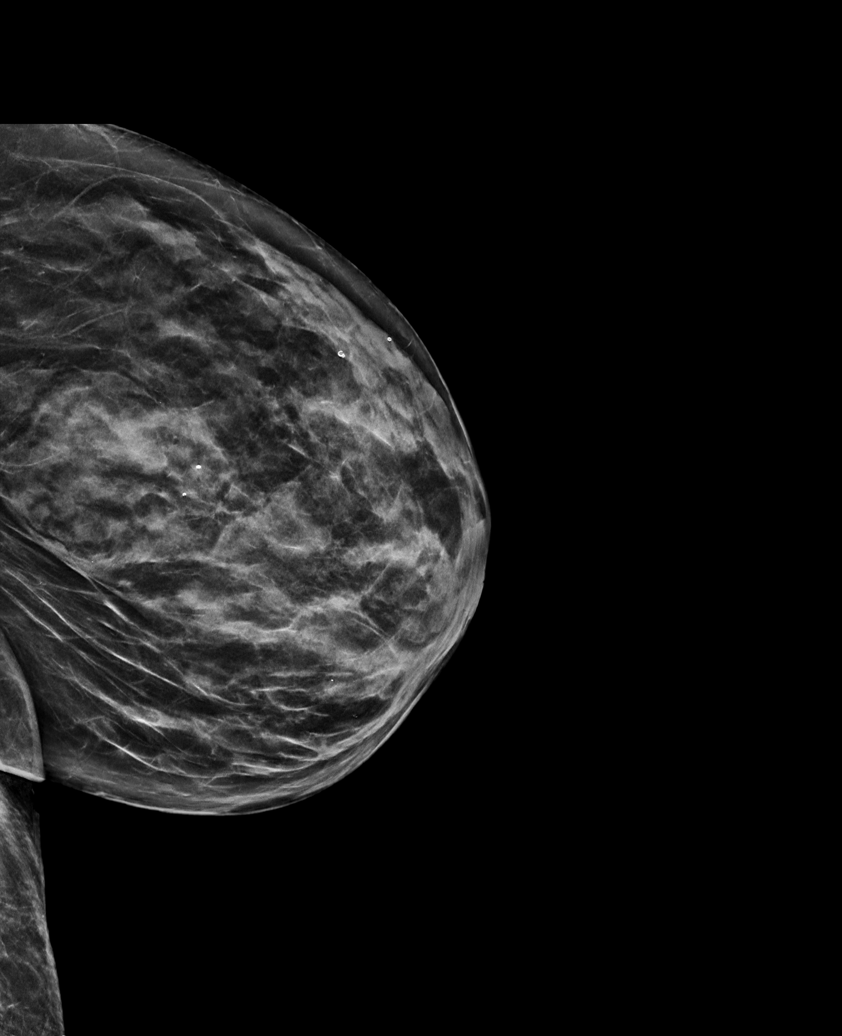

[R MLO synth-2D]
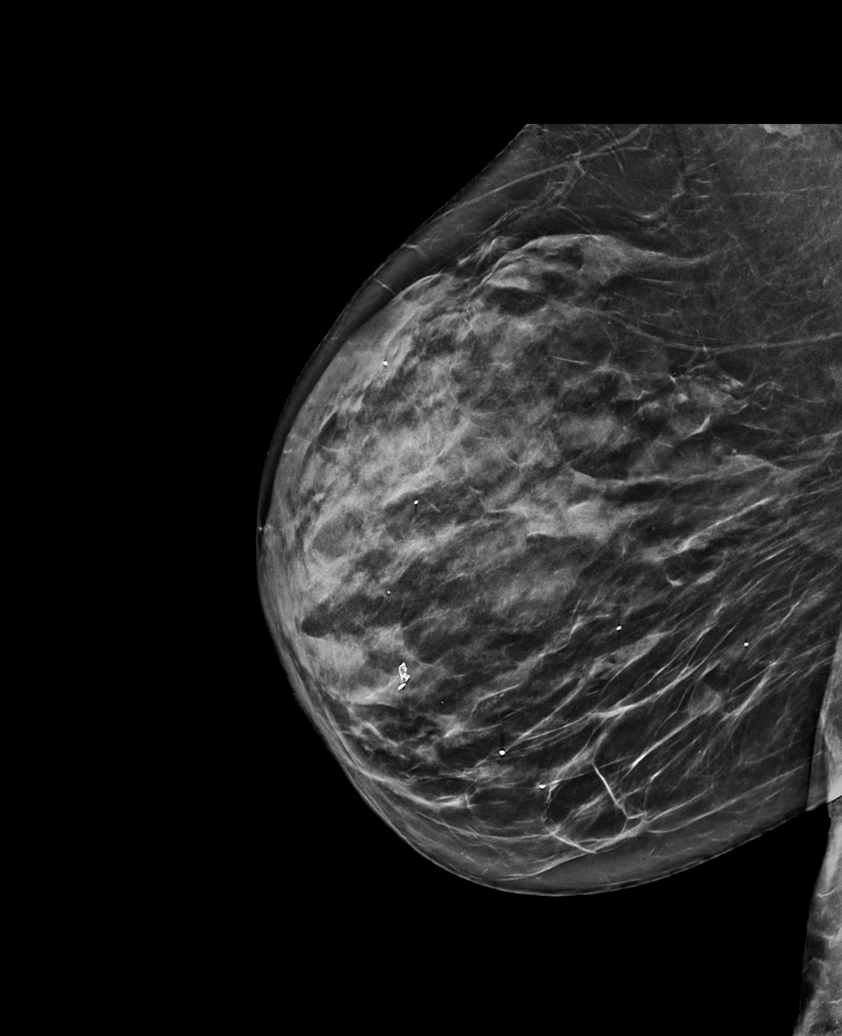

[L MLO synth-2D (2 of 2)]
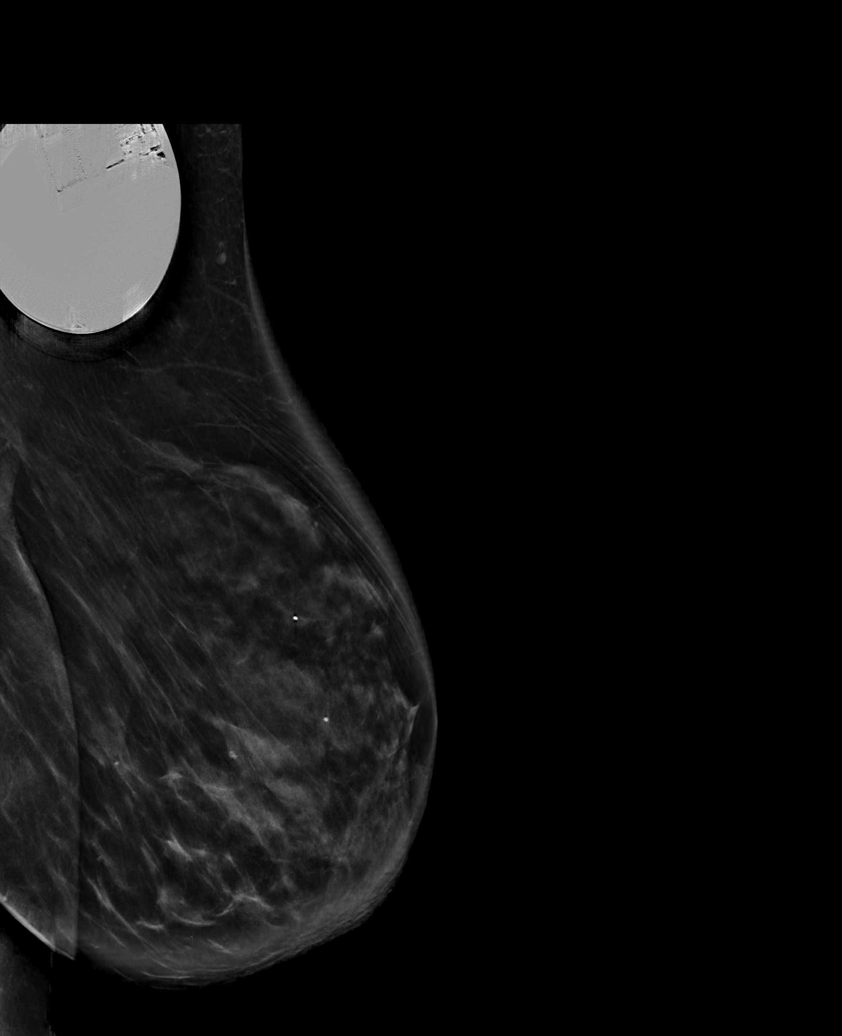

[L CC synth-2D]
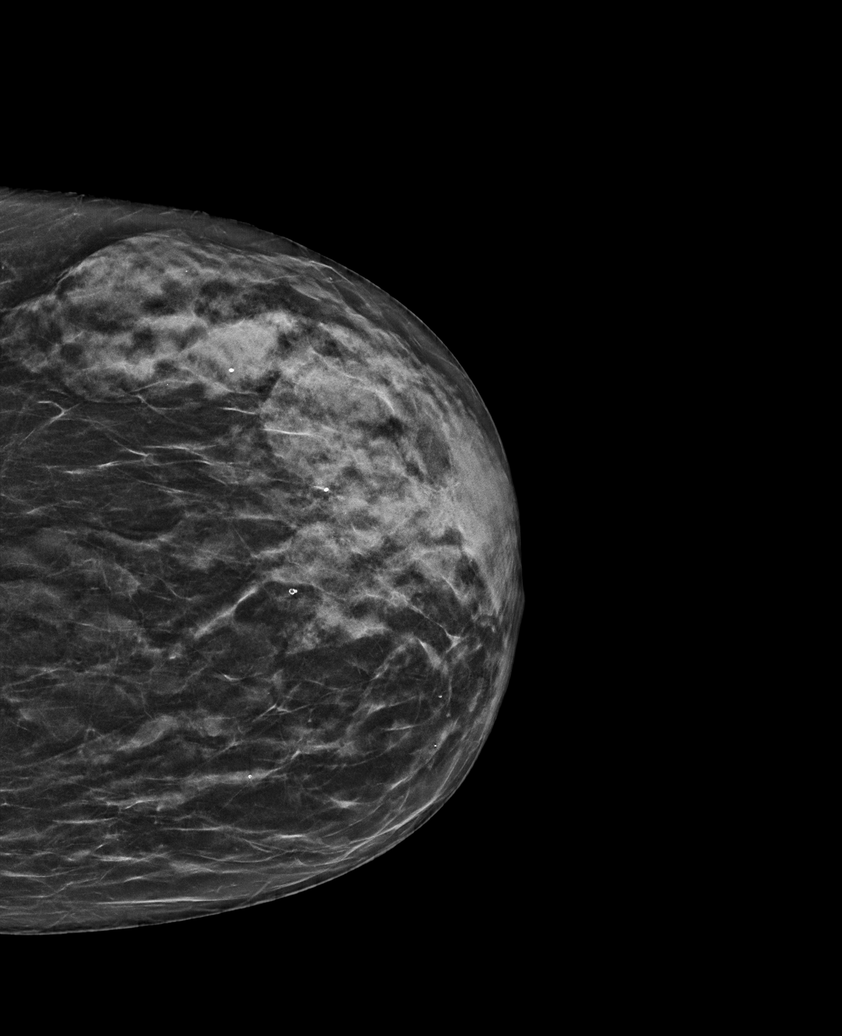

[L MLO tomo · tomo slice 31/60.0]
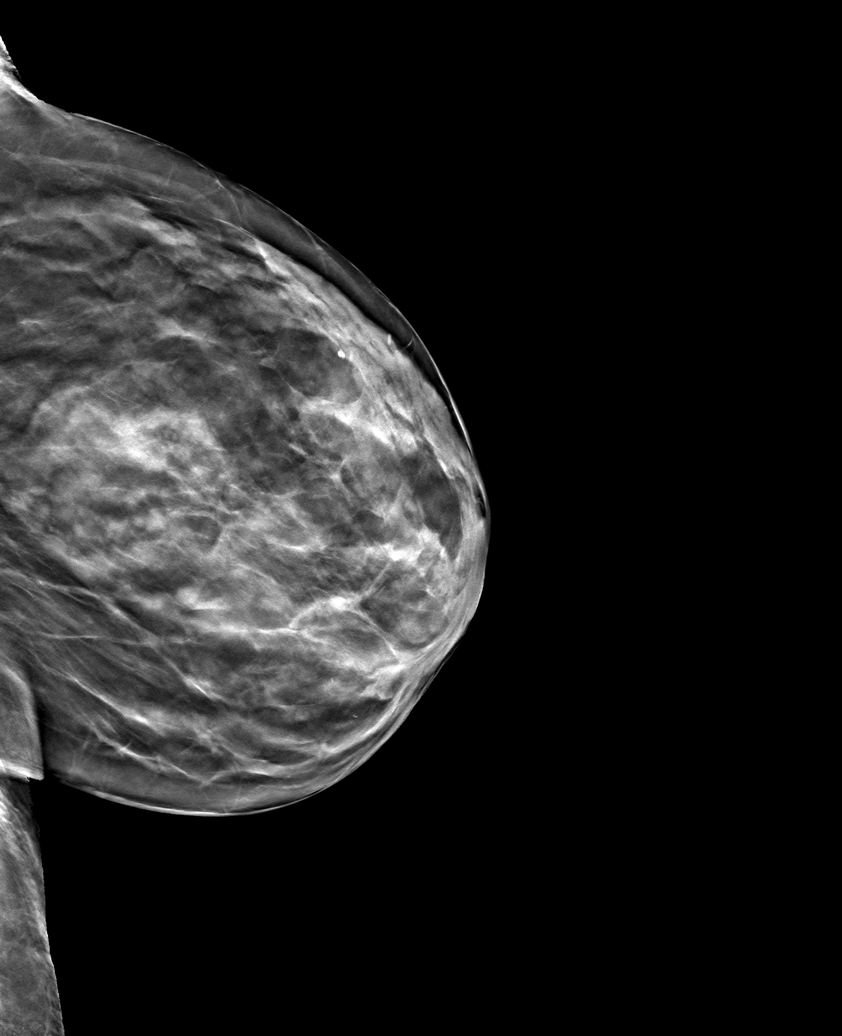

[6 of 30 positions shown; findings below may reference images not displayed]

ACR Breast Density Category c: The breast tissue is heterogeneously
dense, which may obscure small masses.
FINDINGS: There are no findings suspicious for malignancy.
IMPRESSION: No mammographic evidence of malignancy. A result letter of this
screening mammogram will be mailed directly to the patient.

RECOMMENDATION:
Screening mammogram in one year. (Code:[V2])

BI-RADS CATEGORY  1: Negative.

## 2020-06-17 NOTE — Progress Notes (Signed)
Remote ICD transmission.   

## 2020-07-16 ENCOUNTER — Other Ambulatory Visit: Payer: Self-pay

## 2020-07-16 ENCOUNTER — Encounter: Payer: Self-pay | Admitting: Physician Assistant

## 2020-07-16 ENCOUNTER — Ambulatory Visit (INDEPENDENT_AMBULATORY_CARE_PROVIDER_SITE_OTHER): Payer: Medicare Other | Admitting: Physician Assistant

## 2020-07-16 VITALS — BP 140/80 | HR 50 | Temp 97.8°F | Ht 62.0 in | Wt 132.2 lb

## 2020-07-16 DIAGNOSIS — G8929 Other chronic pain: Secondary | ICD-10-CM | POA: Diagnosis not present

## 2020-07-16 DIAGNOSIS — R519 Headache, unspecified: Secondary | ICD-10-CM | POA: Diagnosis not present

## 2020-07-16 DIAGNOSIS — H9313 Tinnitus, bilateral: Secondary | ICD-10-CM

## 2020-07-16 DIAGNOSIS — H9193 Unspecified hearing loss, bilateral: Secondary | ICD-10-CM | POA: Diagnosis not present

## 2020-07-16 LAB — COMPREHENSIVE METABOLIC PANEL
ALT: 16 U/L (ref 0–35)
AST: 16 U/L (ref 0–37)
Albumin: 4.7 g/dL (ref 3.5–5.2)
Alkaline Phosphatase: 68 U/L (ref 39–117)
BUN: 16 mg/dL (ref 6–23)
CO2: 30 mEq/L (ref 19–32)
Calcium: 9.4 mg/dL (ref 8.4–10.5)
Chloride: 103 mEq/L (ref 96–112)
Creatinine, Ser: 0.93 mg/dL (ref 0.40–1.20)
GFR: 65.1 mL/min (ref >60.00)
Glucose, Bld: 99 mg/dL (ref 70–99)
Potassium: 4.1 mEq/L (ref 3.5–5.1)
Sodium: 140 mEq/L (ref 135–145)
Total Bilirubin: 0.8 mg/dL (ref 0.2–1.2)
Total Protein: 7.3 g/dL (ref 6.0–8.3)

## 2020-07-16 LAB — CBC WITH DIFFERENTIAL/PLATELET
Basophils Absolute: 0.1 10*3/uL (ref 0.0–0.1)
Basophils Relative: 1.2 % (ref 0.0–3.0)
Eosinophils Absolute: 0.1 10*3/uL (ref 0.0–0.7)
Eosinophils Relative: 1.5 % (ref 0.0–5.0)
HCT: 37.3 % (ref 36.0–46.0)
Hemoglobin: 12.3 g/dL (ref 12.0–15.0)
Lymphocytes Relative: 27.5 % (ref 12.0–46.0)
Lymphs Abs: 1.9 10*3/uL (ref 0.7–4.0)
MCHC: 32.9 g/dL (ref 30.0–36.0)
MCV: 86.3 fl (ref 78.0–100.0)
Monocytes Absolute: 0.6 10*3/uL (ref 0.1–1.0)
Monocytes Relative: 9.2 % (ref 3.0–12.0)
Neutro Abs: 4.1 10*3/uL (ref 1.4–7.7)
Neutrophils Relative %: 60.6 % (ref 43.0–77.0)
Platelets: 375 10*3/uL (ref 150.0–400.0)
RBC: 4.32 Mil/uL (ref 3.87–5.11)
RDW: 14.2 % (ref 11.5–15.5)
WBC: 6.8 10*3/uL (ref 4.0–10.5)

## 2020-07-16 LAB — POC URINALSYSI DIPSTICK (AUTOMATED)
Bilirubin, UA: NEGATIVE
Blood, UA: NEGATIVE
Glucose, UA: NEGATIVE
Ketones, UA: NEGATIVE
Leukocytes, UA: NEGATIVE
Nitrite, UA: NEGATIVE
Protein, UA: NEGATIVE
Spec Grav, UA: 1.015 (ref 1.010–1.025)
Urobilinogen, UA: 0.2 E.U./dL
pH, UA: 6 (ref 5.0–8.0)
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
## 2020-07-16 NOTE — Patient Instructions (Addendum)
It was great to see you!  Update blood work and urine today.  Referral to audiology placed as well.  MRI to check out your brain today.  Follow-up with Dr. [name] in 1-2 weeks, sooner if concerns.  Headache Precautions: Contact a doctor if:  Your symptoms are not helped by medicine.  You have a headache that feels different than the other headaches.  You feel sick to your stomach (nauseous) or you throw up (vomit).  You have a fever. Get help right away if:  Your headache gets very bad quickly.  Your headache gets worse after a lot of physical activity.  You keep throwing up.  You have a stiff neck.  You have trouble seeing.  You have trouble speaking.  You have pain in the eye or ear.  Your muscles are weak or you lose muscle control.  You lose your balance or have trouble walking.  You feel like you will pass out (faint) or you pass out.  You are mixed up (confused).  You have a seizure.

## 2020-07-17 ENCOUNTER — Ambulatory Visit (HOSPITAL_COMMUNITY)
Admission: RE | Admit: 2020-07-17 | Discharge: 2020-07-17 | Disposition: A | Discharge status: Home / Self Care | Payer: Medicare Other | Source: Ambulatory Visit | Attending: Physician Assistant | Admitting: Physician Assistant

## 2020-07-17 DIAGNOSIS — R519 Headache, unspecified: Secondary | ICD-10-CM

## 2020-07-19 ENCOUNTER — Telehealth: Payer: Self-pay

## 2020-07-19 NOTE — Telephone Encounter (Signed)
Patient had an MRI scheduled for Saturday and went to [place] to have it done and wasn't able to have MRI  due to her pace maker she needs to go to [place] [facility] in order to get MRI done due to them having a cardiologist just in case. Can we reorder to [facility].  Patient needs this to be scheduled today or Friday she works the rest of the week

## 2020-07-19 NOTE — Telephone Encounter (Signed)
Anita Diaz, please see message from pt and reschedule.

## 2020-07-21 NOTE — Telephone Encounter (Signed)
Patient needs at [facility] on a mon or fri

## 2020-07-23 NOTE — Telephone Encounter (Signed)
I contacted the pt to make her aware that [facility] will be calling her back once their MRI team verifies the [facility] will schedule with her

## 2020-08-03 ENCOUNTER — Other Ambulatory Visit: Payer: Self-pay | Admitting: Internal Medicine

## 2020-08-09 ENCOUNTER — Ambulatory Visit (HOSPITAL_COMMUNITY)
Admission: RE | Admit: 2020-08-09 | Discharge: 2020-08-09 | Disposition: A | Discharge status: Home / Self Care | Payer: Medicare Other | Source: Ambulatory Visit | Attending: Physician Assistant | Admitting: Physician Assistant

## 2020-08-09 ENCOUNTER — Other Ambulatory Visit: Payer: Self-pay

## 2020-08-09 DIAGNOSIS — R519 Headache, unspecified: Secondary | ICD-10-CM | POA: Insufficient documentation

## 2020-08-09 DIAGNOSIS — G8929 Other chronic pain: Secondary | ICD-10-CM | POA: Insufficient documentation

## 2020-08-09 IMAGING — MR MR HEAD W/O CM
9 of 10 series · 39 of 48 positions shown · non-contrast
Comparison: None.

CLINICAL DATA: Headache.  Intracranial hemorrhage suspected.

EXAM:
MRI HEAD WITHOUT CONTRAST
TECHNIQUE: Multiplanar, multiecho pulse sequences of the brain and surrounding
structures were obtained without intravenous contrast.

[Series 3: DWI · axial · 3.0mm · 1.09mm/px · z∈[-76,+77]mm · 11 of 106 slices shown (1 of 4)]
[im 1/106]
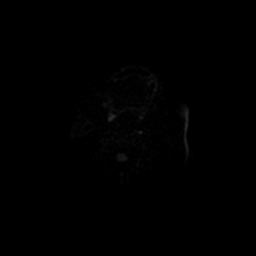
[im 11/106]
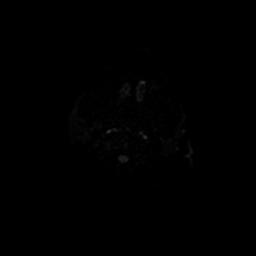
[im 22/106]
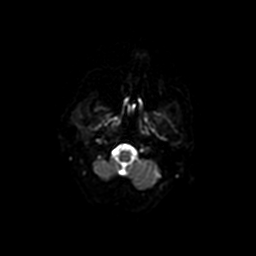
[im 32/106]
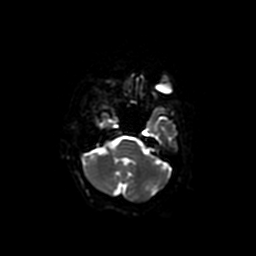
[im 43/106]
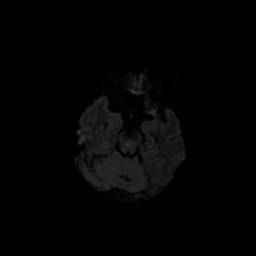
[im 53/106]
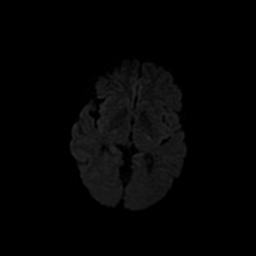
[im 64/106]
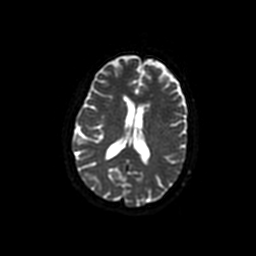
[im 74/106]
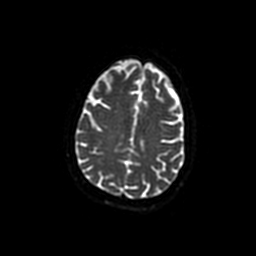
[im 85/106]
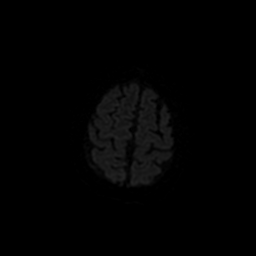
[im 95/106]
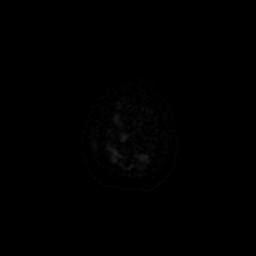
[im 106/106]
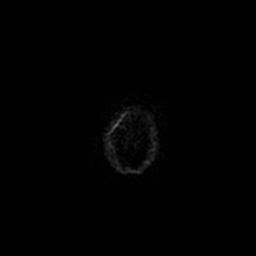

[Series 4: DWI · coronal · 5.0mm · 1.09mm/px · 8 of 76 slices shown (2 of 4)]
[im 1/76]
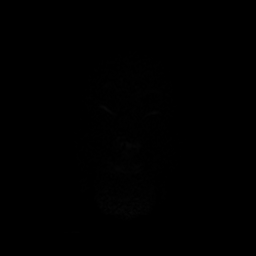
[im 11/76]
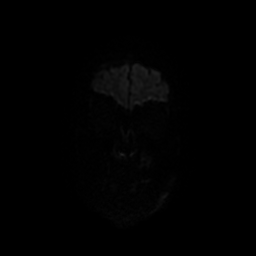
[im 22/76]
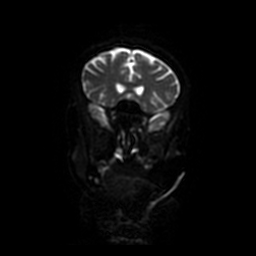
[im 33/76]
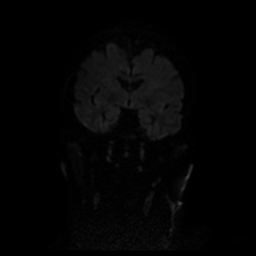
[im 43/76]
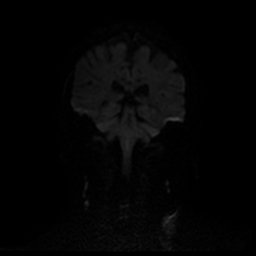
[im 54/76]
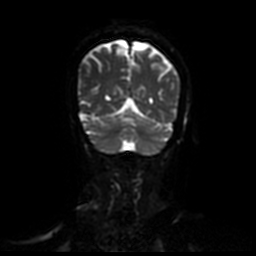
[im 65/76]
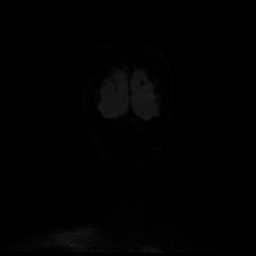
[im 76/76]
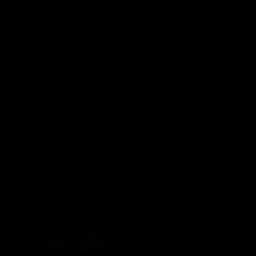

[Series 5: T1 · sagittal · 5.0mm · 0.47mm/px · 2 of 23 slices shown (1 of 2)]
[im 1/23]
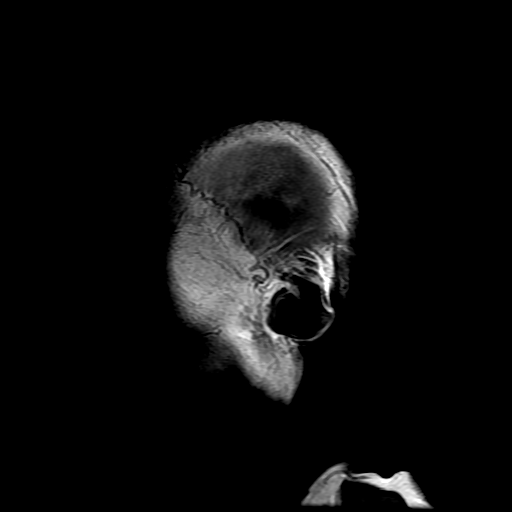
[im 23/23]
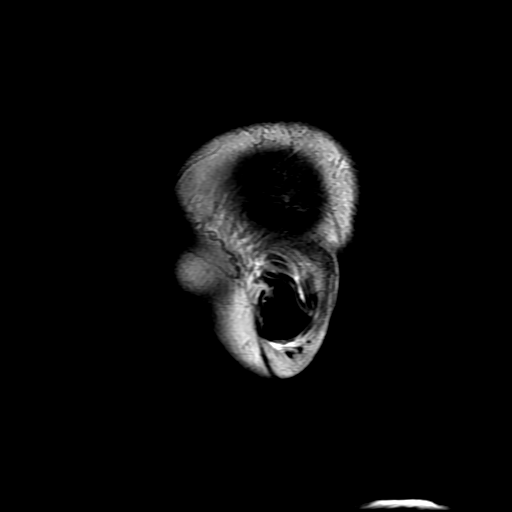

[Series 6: T2 · axial · 5.0mm · 0.43mm/px · z∈[-67,+73]mm · 2 of 25 slices shown (1 of 2)]
[im 1/25]
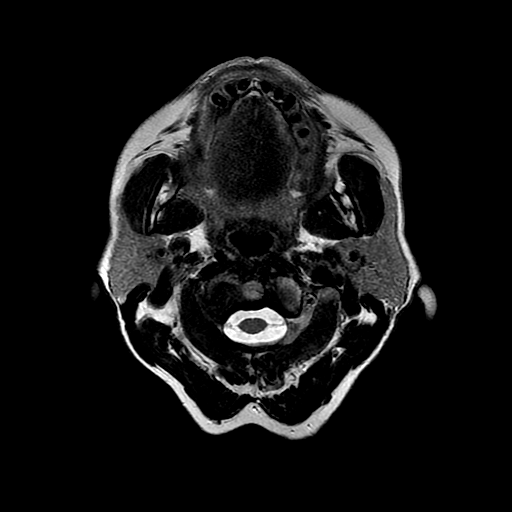
[im 25/25]
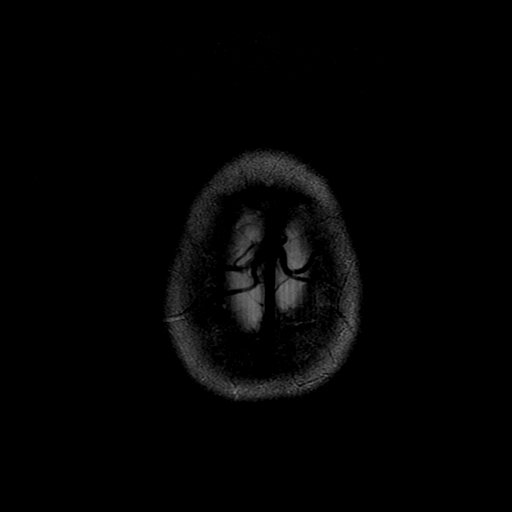

[Series 7: FLAIR · axial · 3.0mm · 0.43mm/px · z∈[-67,+73]mm · 2 of 25 slices shown]
[im 1/25]
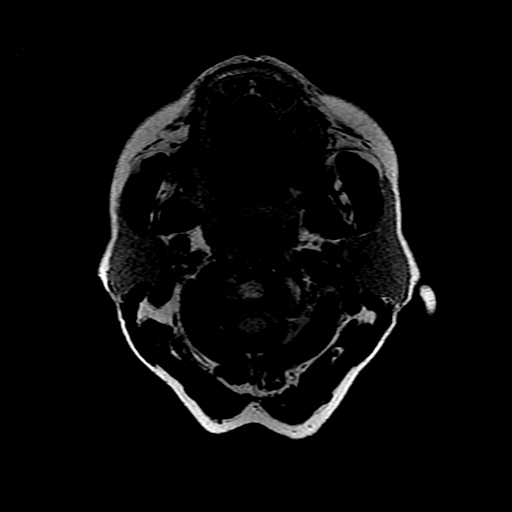
[im 25/25]
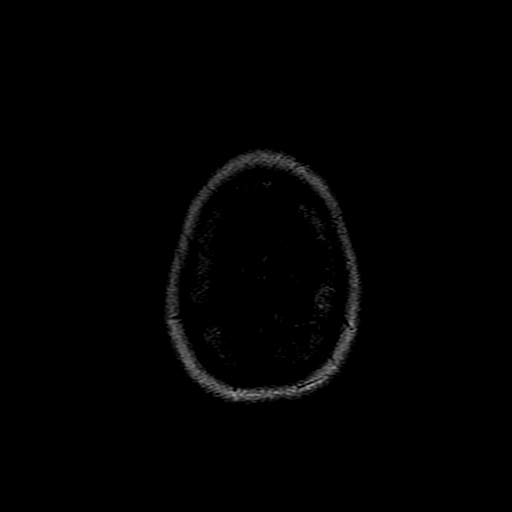

[Series 9: T1 · axial · 3.0mm · 0.47mm/px · z∈[-71,-22]mm · 3 of 100 slices shown (2 of 2)]
[im 1/100]
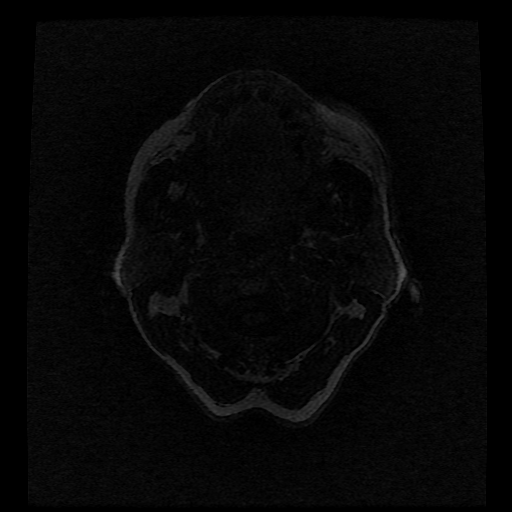
[im 12/100]
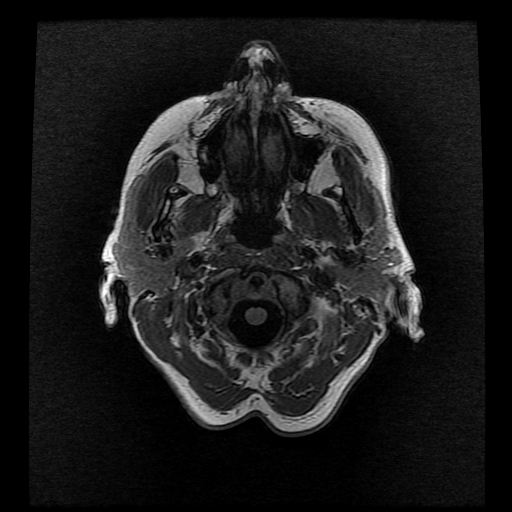
[im 34/100]
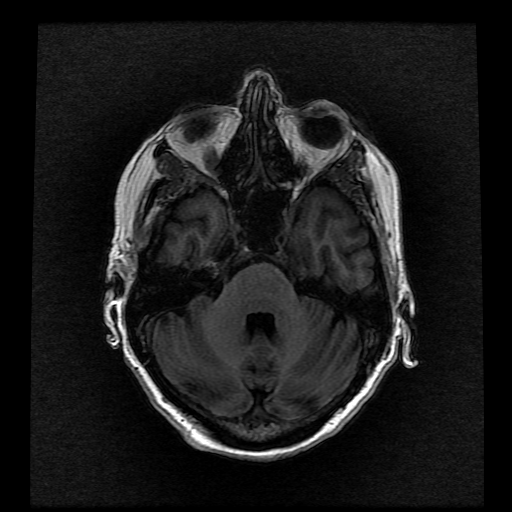

[Series 10: T2 · coronal · 5.0mm · 0.39mm/px · 2 of 24 slices shown (2 of 2)]
[im 1/24]
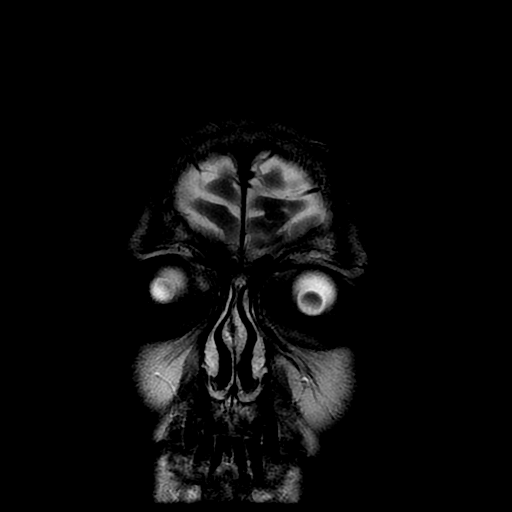
[im 24/24]
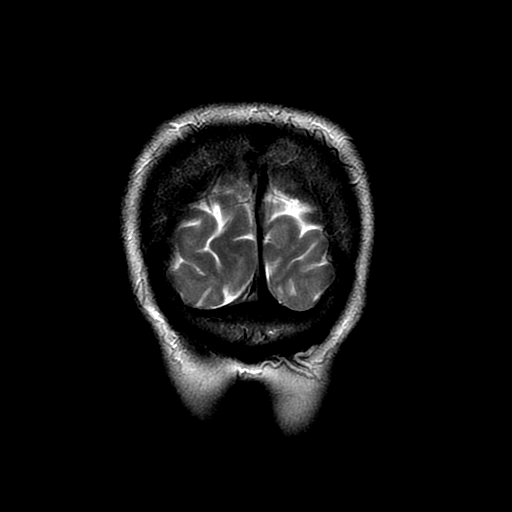

[Series 300: DWI · axial · 3.0mm · 1.09mm/px · z∈[-76,+77]mm · 5 of 53 slices shown (3 of 4)]
[im 1/53]
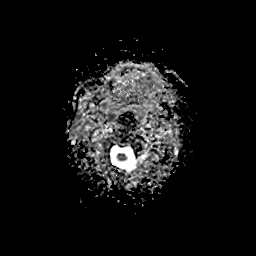
[im 14/53]
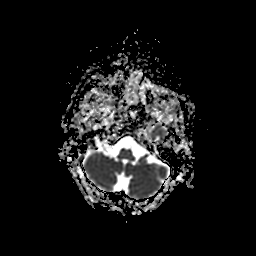
[im 27/53]
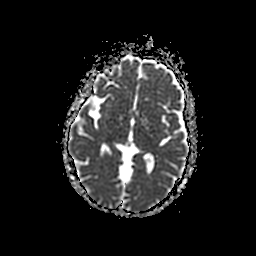
[im 40/53]
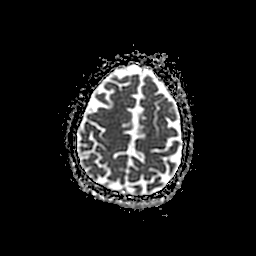
[im 53/53]
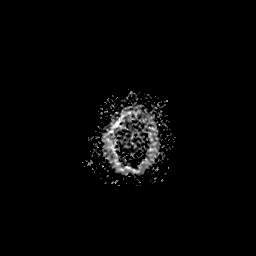

[Series 400: DWI · coronal · 5.0mm · 1.09mm/px · 4 of 38 slices shown (4 of 4)]
[im 1/38]
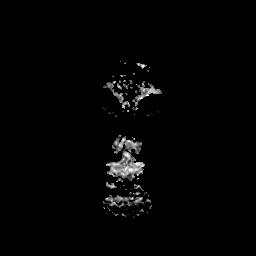
[im 13/38]
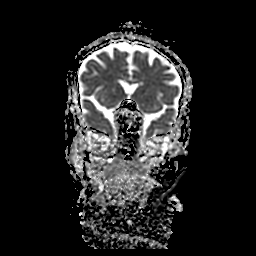
[im 25/38]
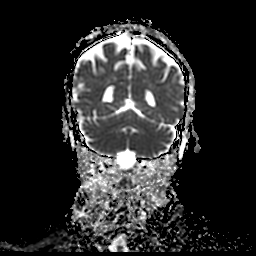
[im 38/38  full-range]
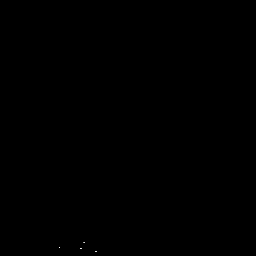

[39 of 48 positions shown; findings below may reference images not displayed]

FINDINGS: Brain: No acute infarction, hemorrhage, hydrocephalus, extra-axial
collection or mass lesion. Mild white matter changes with scattered
small subcortical white matter hyperintensities bilaterally.

Vascular: Normal arterial flow voids. Hypoplastic vertebral arteries
and basilar likely due to fetal origin of the posterior cerebral
artery bilaterally.

Skull and upper cervical spine: Negative.  ACDF C3-4.

Sinuses/Orbits: Mild mucosal edema paranasal sinuses. Negative
orbit. Mild right mastoid effusion.

Other: None
IMPRESSION: No acute intracranial abnormality. No intracranial hemorrhage. Mild
white matter changes likely due to chronic microvascular ischemia.

## 2020-08-09 NOTE — Progress Notes (Signed)
[facility] here today at [facility] for [facility] brain wo contrast. Patient has Research officer, political party. Heart connect completed with Joey- Rep. Orders for DOO 70, LV amplitude 3.5. will re-program once scan is completed.

## 2020-08-11 ENCOUNTER — Telehealth: Payer: Self-pay

## 2020-08-11 ENCOUNTER — Other Ambulatory Visit: Payer: Self-pay

## 2020-08-11 DIAGNOSIS — G8929 Other chronic pain: Secondary | ICD-10-CM

## 2020-08-11 DIAGNOSIS — R519 Headache, unspecified: Secondary | ICD-10-CM

## 2020-08-11 NOTE — Telephone Encounter (Signed)
Patient notified of results she voices understanding. She stated insurance denied MRI due to incorrect Dx code, a form has been sent for correction.

## 2020-08-11 NOTE — Telephone Encounter (Signed)
Pt is returning call for MRI results.

## 2020-08-11 NOTE — Telephone Encounter (Signed)
Patient called in stating she got a mri done Monday 08/09/2020 and she said she doesn't think they changed her settings back to what it should be and she feels like her heart has been racing. Patient is not home at the moment to send a transmission and she will send one this evening when she gets home. Patient would like for a nurse to call her for reassurance

## 2020-08-12 NOTE — Telephone Encounter (Signed)
Manual transmission received.    Device was reprogrammed appropriately following MRI.  Normal device function.    Attempted to return pt phone call, no answer.  Left message advising that device is programmed correctly and functioning normally.  Provided device # and hours if pt has any additional concerns.

## 2020-08-16 ENCOUNTER — Other Ambulatory Visit: Payer: Self-pay | Admitting: Family Medicine

## 2020-08-16 ENCOUNTER — Ambulatory Visit: Payer: Medicare Other | Attending: Physician Assistant | Admitting: Audiology

## 2020-08-16 ENCOUNTER — Other Ambulatory Visit: Payer: Self-pay

## 2020-08-16 DIAGNOSIS — H903 Sensorineural hearing loss, bilateral: Secondary | ICD-10-CM

## 2020-08-16 NOTE — Procedures (Signed)
  Outpatient Audiology and [facility] [address] [place], [place]  [address] [phone]  AUDIOLOGICAL  EVALUATION  NAME: Anita Diaz     DOB:   Apr 26, 1955      MRN: [id number]                                                                                     DATE: 08/16/2020     REFERENT: Ardith Dark, MD STATUS: Outpatient DIAGNOSIS: Sensorineural hearing loss, bilateral    History: [name] was seen for an audiological evaluation due to tinnitus occurring for 1 year. [name] reports her tinnitus is a constant hum and is worse during the day. She reports left, intermittent, aural fullness. She denies otalgia, vertigo, and hearing concerns.   Evaluation:   Otoscopy showed a clear view of the tympanic membranes, bilaterally  Tympanometry results were consistent with normal middle ear pressure and reduced tympanic membrane mobility, bilaterally.   Audiometric testing was completed using Conventional Audiometry techniques with insert earphones and TDH headphones. Test results are consistent with normal hearing sensitivity sloping to a mild sensorineural hearing loss in the left ear and normal hearing sensitivity sloping to a moderate sensorineural hearing loss in the right ear. Speech Recognition Thresholds were obtained at 25 dB HL, bilaterally. Word Recognition Testing was completed at 70 dB HL and [name] scored 88% in the right ear and 96% in the left ear.    Results:  Today's results are consistent with normal hearing sensitivity sloping to a mild sensorineural hearing loss in the left ear and normal hearing sensitivity sloping to a moderate sensorineural hearing loss in the right ear. The test results and recommendations were reviewed.  [name] will have communication difficulty in adverse listening situations. She will benefit from the use of good communication strategies. [name] was given handouts about effective communication strategies. [name] was counseled  regarding her tinnitus and tinnitus management strategies were reviewed.   Recommendations: 1. Return in 2-3 years to monitor hearing sensitivity.     [name] Audiologist, Au.D., CCC-A 08/16/2020  2:12 PM  Cc: Ardith Dark, MD

## 2020-09-09 ENCOUNTER — Ambulatory Visit (INDEPENDENT_AMBULATORY_CARE_PROVIDER_SITE_OTHER): Payer: Medicare Other

## 2020-09-09 DIAGNOSIS — I428 Other cardiomyopathies: Secondary | ICD-10-CM | POA: Diagnosis not present

## 2020-09-09 LAB — CUP PACEART REMOTE DEVICE CHECK
Battery Remaining Longevity: 120 mo
Battery Remaining Percentage: 100 %
Brady Statistic RA Percent Paced: 9 %
Brady Statistic RV Percent Paced: 3 %
Date Time Interrogation Session: 20220519044000
HighPow Impedance: 68 Ohm
Implantable Lead Implant Date: 20190718
Implantable Lead Implant Date: 20190718
Implantable Lead Implant Date: 20190718
Implantable Lead Location: 753858
Implantable Lead Location: 753859
Implantable Lead Location: 753860
Implantable Lead Model: 292
Implantable Lead Model: 4671
Implantable Lead Model: 7740
Implantable Lead Serial Number: 447121
Implantable Lead Serial Number: 694576
Implantable Lead Serial Number: 815914
Implantable Pulse Generator Implant Date: 20190718
Lead Channel Impedance Value: 465 Ohm
Lead Channel Impedance Value: 473 Ohm
Lead Channel Impedance Value: 529 Ohm
Lead Channel Setting Pacing Amplitude: 2 V
Lead Channel Setting Pacing Amplitude: 2.4 V
Lead Channel Setting Pacing Amplitude: 2.6 V
Lead Channel Setting Pacing Pulse Width: 0.4 ms
Lead Channel Setting Pacing Pulse Width: 0.4 ms
Lead Channel Setting Sensing Sensitivity: 0.5 mV
Lead Channel Setting Sensing Sensitivity: 1 mV
Pulse Gen Serial Number: 215046

## 2020-10-01 NOTE — Progress Notes (Signed)
Remote ICD transmission.   

## 2020-10-18 ENCOUNTER — Encounter: Payer: Self-pay | Admitting: Family Medicine

## 2020-10-18 ENCOUNTER — Other Ambulatory Visit: Payer: Self-pay

## 2020-10-18 ENCOUNTER — Ambulatory Visit (INDEPENDENT_AMBULATORY_CARE_PROVIDER_SITE_OTHER): Payer: Medicare Other | Admitting: Family Medicine

## 2020-10-18 VITALS — BP 112/71 | HR 58 | Temp 97.6°F | Wt 134.2 lb

## 2020-10-18 DIAGNOSIS — F321 Major depressive disorder, single episode, moderate: Secondary | ICD-10-CM | POA: Diagnosis not present

## 2020-10-18 MED ORDER — ARIPIPRAZOLE 5 MG PO TABS: 5.0000 mg | ORAL_TABLET | Freq: Every day | ORAL | 5 refills | Status: DC

## 2020-10-18 MED ORDER — DIAZEPAM 5 MG PO TABS: 5.0000 mg | ORAL_TABLET | Freq: Two times a day (BID) | ORAL | 1 refills | Status: DC | PRN

## 2020-10-18 NOTE — Progress Notes (Signed)
   Anita Diaz is a 65 y.o. female who presents today for an office visit.  Assessment/Plan:  New/Acute Problems: Hematoma Nodule left anterior shin likely resolving hematoma.  Could have a small amount of callus formation around her anterior tibia.  Discussed potentially x-ray however symptoms are not currently bothersome.  We will continue with watchful waiting.  She will let me know if it becomes larger or more painful.  Chronic Problems Addressed Today: Depression, major, single episode, moderate (HCC) Not well controlled.  Exacerbated by recent passing of her husband.  We discussed treatment options.  We will continue Wellbutrin 300 mg daily and Prozac 40 mg daily.  Add on Abilify 5 mg daily.  We will also start low-dose Valium to use as needed for anxiety/panic attacks.  She will check in with me in a couple weeks.  Depending on response we could titrate dose of abilify, try alternative augmenting medication, or try SNRI.      Subjective:  HPI: Her husband had passed away this 2022-07-06, and has not been sleeping well since his passing. She has some support in the form of a friend.  She admits to having fallen 6 weeks ago, and hit her left leg. There is a "bump" where she had hit her leg, that has not fully dissipated. Pain and bruising were originally present at point of injury, but have since resolved.  Additionally, she complains of anxiety attacks, as well as her antidepressants not working. She does not express any interest in meeting with a counselor. She is compliant with all medication, with no notable side effects.         Objective:  Physical Exam: BP 112/71   Pulse (!) 58   Temp 97.6 F (36.4 C) (Temporal)   Wt 134 lb 3.2 oz (60.9 kg)   SpO2 97%   BMI 24.55 kg/m   Gen: No acute distress, resting comfortably CV: Regular rate and rhythm with no murmurs appreciated Pulm: Normal work of breathing, clear to auscultation bilaterally with no crackles, wheezes, or  rhonchi MSK: Hard nodule approximately 1 cm in diameter on the left anterior shin Neuro: Grossly normal, moves all extremities Psych: Normal affect and thought content      I,Jordan Kelly,acting as a scribe for Jacquiline Doe, MD.,have documented all relevant documentation on the behalf of Jacquiline Doe, MD,as directed by  Jacquiline Doe, MD while in the presence of Jacquiline Doe, MD.   I, Jacquiline Doe, MD, have reviewed all documentation for this visit. The documentation on 10/18/20 for the exam, diagnosis, procedures, and orders are all accurate and complete.   [name]. [name], MD 10/18/2020 9:17 AM

## 2020-10-18 NOTE — Patient Instructions (Signed)
It was very nice to see you today!  Please start the Abilify.  You can use the Valium as needed.  We will leave all your other medications the same.  Please check in with me in a couple of weeks to let me know how this is working for you.  Take care, Dr [name]  PLEASE NOTE:  If you had any lab tests please let us know if you have not heard back within a few days. You may see your results on mychart before we have a chance to review them but we will give you a call once they are reviewed by [place]. If we ordered any referrals today, please let us know if you have not heard from their office within the next week.   Please try these tips to maintain a healthy lifestyle:  Eat at least 3 REAL meals and 1-2 snacks per day.  Aim for no more than 5 hours between eating.  If you eat breakfast, please do so within one hour of getting up.   Each meal should contain half fruits/vegetables, one quarter protein, and one quarter carbs (no bigger than a computer mouse)  Cut down on sweet beverages. This includes juice, soda, and sweet tea.   Drink at least 1 glass of water with each meal and aim for at least 8 glasses per day  Exercise at least 150 minutes every week.

## 2020-10-18 NOTE — Assessment & Plan Note (Addendum)
Not well controlled.  Exacerbated by recent passing of her husband.  We discussed treatment options.  We will continue Wellbutrin 300 mg daily and Prozac 40 mg daily.  Add on Abilify 5 mg daily.  We will also start low-dose Valium to use as needed for anxiety/panic attacks.  She will check in with me in a couple weeks.  Depending on response we could titrate dose of abilify, try alternative augmenting medication, or try SNRI.

## 2020-11-01 ENCOUNTER — Other Ambulatory Visit: Payer: Self-pay | Admitting: Family Medicine

## 2020-11-01 ENCOUNTER — Ambulatory Visit: Payer: Medicare Other | Admitting: Neurology

## 2020-11-01 ENCOUNTER — Encounter: Payer: Self-pay | Admitting: Neurology

## 2020-11-01 VITALS — BP 99/70 | HR 55 | Ht 62.0 in | Wt 137.0 lb

## 2020-11-01 DIAGNOSIS — G4719 Other hypersomnia: Secondary | ICD-10-CM | POA: Diagnosis not present

## 2020-11-01 DIAGNOSIS — R42 Dizziness and giddiness: Secondary | ICD-10-CM

## 2020-11-01 DIAGNOSIS — R2689 Other abnormalities of gait and mobility: Secondary | ICD-10-CM

## 2020-11-01 DIAGNOSIS — R202 Paresthesia of skin: Secondary | ICD-10-CM | POA: Diagnosis not present

## 2020-11-01 DIAGNOSIS — R519 Headache, unspecified: Secondary | ICD-10-CM | POA: Diagnosis not present
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
## 2020-11-01 NOTE — Patient Instructions (Signed)
Dizziness: improving post concussive, monitor closely Headaches: morning predominance, Sleepiness scale 13, likely OSA order sleep eval Tingling in the fingers: Probably Carpal Tunnel Syndrome, conservative measures Recommend MRI cervical spine for stenosis due to brisk reflexes and imbalance, let us know if it gets worse we recommend evaluation  Post-Concussion Syndrome  A concussion is a brain injury from a direct hit to the head or body. This hit causes the brain to shake quickly back and forth inside the skull. This can damage brain cells and cause chemical changes in the brain. Concussions areusually not life-threatening but can cause serious symptoms. Post-concussion syndrome is when symptoms that occur after a concussion lastlonger than normal. These symptoms can last from weeks to months. What are the causes? The cause of this condition is not known. It can happen whether your headinjury was mild or severe. What increases the risk? You are more likely to develop this condition if: You are female. You are a child, teen, or young adult. You have had a past head injury. You have a history of headaches. You have depression or anxiety. You have loss of consciousness or cannot remember the event (have amnesia of the event). You have multiple symptoms or severe symptoms at the time of your concussion. What are the signs or symptoms? Symptoms of this condition include: Physical symptoms. You may have: Headaches. Tiredness. Dizziness and weakness. Blurry vision and sensitivity to light. Hearing difficulties. Problems with balance. Mental and emotional symptoms. You may have: Memory problems and trouble concentrating. Difficulty sleeping or staying asleep. Feelings of irritability. Anxiety or depression. Difficulty learning new things. How is this diagnosed? This condition may be diagnosed based on: Your symptoms. A description of your injury. Your medical history. Testing your  strength, balance, and nerve function (neurological examination). Your health care provider may order other tests, including brain imaging such as a CT scan or an MRI, and memory testing (neuropsychological testing). How is this treated? Treatment for this condition may depend on your symptoms. Symptoms usually go away on their own over time. Treatments may include: Medicines for headaches, anxiety, depression, and trouble sleeping (insomnia). Resting your brain and body for a few days after your injury. Rehabilitation therapy, such as: Physical or occupational therapy. This may include exercises to help with balance and dizziness. Mental health counseling. A form of talk therapy called cognitive behavioral therapy (CBT) can be especially helpful. This therapy helps you set goals and follow up on the changes that you make. Speech therapy. Vision therapy. A brain and eye specialist can recommend treatments for vision problems. Follow these instructions at home: Medicines Take over-the-counter and prescription medicines only as told by your health care provider. Avoid opioid prescription pain medicines when recovering from a concussion. Activity Limit your mental activities for the first few days after your injury. This may include not doing the following: Homework or job-related work. Complex thinking. Watching TV, and using a computer or phone. Playing memory games and puzzles. Gradually return to your normal activity level. If a certain activity brings on your symptoms, stop or slow down until you can do the activity without it triggering your symptoms. Limit physical activity, such as exercise or sports, for the first few days after a concussion. Gradually return to normal activity as told by your health care provider. Rest. Rest helps your brain heal. Make sure you: Get plenty of sleep at night. Most adults should get at least 7-9 hours of sleep each night. Rest during the day. Take naps  or rest breaks when you feel tired. Do not do high-risk activities that could cause a second concussion, such as riding a bike or playing sports. Having another concussion before the first one has healed can be dangerous. General instructions  Do not drink alcohol until your health care provider says that you can. Keep track of the frequency and the severity of your symptoms. Give this information to your health care provider. Keep all follow-up visits as directed by your health care provider. This is important. This includes visits with specialists.  Contact a health care provider if: Your symptoms do not improve. You have another injury. Get help right away if you: Have a severe or worsening headache. Are confused. Have trouble staying awake. Faint. Vomit. Have weakness or numbness in any part of your body. Have a seizure. Have trouble speaking. Summary Post-concussion syndrome is when symptoms that occur after a concussion last longer than normal. Symptoms usually go away on their own over time. Depending on your symptoms, you may need treatment, such as medicines or rehabilitation therapy. Rest your brain and body for a few days after your injury. Gradually return to normal activities as told by your health care provider. Get plenty of sleep, and avoid alcohol and opioid pain medicines while recovering from a concussion. This information is not intended to replace advice given to you by your health care provider. Make sure you discuss any questions you have with your healthcare provider. Document Revised: 04/02/2019 Document Reviewed: 04/02/2019 Elsevier Patient Education  2022 Else[employer] Inc.  Carpal Tunnel Syndrome  Carpal tunnel syndrome is a condition that causes pain, numbness, and weakness in your hand and fingers. The carpal tunnel is a narrow area located on the palm side of your wrist. Repeated wrist motion or certain diseases may cause swelling within the tunnel. This  swelling pinches the main nerve in the wrist.The main nerve in the wrist is called the median nerve. What are the causes? This condition may be caused by: Repeated and forceful wrist and hand motions. Wrist injuries. Arthritis. A cyst or tumor in the carpal tunnel. Fluid buildup during pregnancy. Use of tools that vibrate. Sometimes the cause of this condition is not known. What increases the risk? The following factors may make you more likely to develop this condition: Having a job that requires you to repeatedly or forcefully move your wrist or hand or requires you to use tools that vibrate. This may include jobs that involve using computers, working on an [employer]assembly line, or working with power tools such as Radiographer, therapeuticdrills or sanders. Being a woman. Having certain conditions, such as: Diabetes. Obesity. An underactive thyroid (hypothyroidism). Kidney failure. Rheumatoid arthritis. What are the signs or symptoms? Symptoms of this condition include: A tingling feeling in your fingers, especially in your thumb, index, and middle fingers. Tingling or numbness in your hand. An aching feeling in your entire arm, especially when your wrist and elbow are bent for a long time. Wrist pain that goes up your arm to your shoulder. Pain that goes down into your palm or fingers. A weak feeling in your hands. You may have trouble grabbing and holding items. Your symptoms may feel worse during the night. How is this diagnosed? This condition is diagnosed with a medical history and physical exam. You may also have tests, including: Electromyogram (EMG). This test measures electrical signals sent by your nerves into the muscles. Nerve conduction study. This test measures how well electrical signals pass through your nerves. Imaging tests,  such as X-rays, ultrasound, and MRI. These tests check for possible causes of your condition. How is this treated? This condition may be treated with: Lifestyle changes.  It is important to stop or change the activity that caused your condition. Doing exercise and activities to strengthen and stretch your muscles and tendons (physical therapy). Making lifestyle changes to help with your condition and learning how to do your daily activities safely (occupational therapy). Medicines for pain and inflammation. This may include medicine that is injected into your wrist. A wrist splint or brace. Surgery. Follow these instructions at home: If you have a splint or brace: Wear the splint or brace as told by your health care provider. Remove it only as told by your health care provider. Loosen the splint or brace if your fingers tingle, become numb, or turn cold and blue. Keep the splint or brace clean. If the splint or brace is not waterproof: Do not let it get wet. Cover it with a watertight covering when you take a bath or shower. Managing pain, stiffness, and swelling If directed, put ice on the painful area. To do this: If you have a removeable splint or brace, remove it as told by your health care provider. Put ice in a plastic bag. Place a towel between your skin and the bag or between the splint or brace and the bag. Leave the ice on for 20 minutes, 2-3 times a day. Do not fall asleep with the cold pack on your skin. Remove the ice if your skin turns bright red. This is very important. If you cannot feel pain, heat, or cold, you have a greater risk of damage to the area. Move your fingers often to reduce stiffness and swelling. General instructions Take over-the-counter and prescription medicines only as told by your health care provider. Rest your wrist and hand from any activity that may be causing your pain. If your condition is work related, talk with your employer about changes that can be made, such as getting a wrist pad to use while typing. Do any exercises as told by your health care provider, physical therapist, or occupational therapist. Keep all  follow-up visits. This is important. Contact a health care provider if: You have new symptoms. Your pain is not controlled with medicines. Your symptoms get worse. Get help right away if: You have severe numbness or tingling in your wrist or hand. Summary Carpal tunnel syndrome is a condition that causes pain, numbness, and weakness in your hand and fingers. It is usually caused by repeated wrist motions. Lifestyle changes and medicines are used to treat carpal tunnel syndrome. Surgery may be recommended. Follow your health care provider's instructions about wearing a splint, resting from activity, keeping follow-up visits, and calling for help. This information is not intended to replace advice given to you by your health care provider. Make sure you discuss any questions you have with your healthcare provider. Document Revised: 08/21/2019 Document Reviewed: 08/21/2019 Elsevier Patient Education  2022 Elsevier Inc. Sleep Apnea Sleep apnea is a condition in which breathing pauses or becomes shallow during sleep. People with sleep apnea usually snore loudly. They may have times when they gasp and stop breathing for 10 seconds or more during sleep. This mayhappen many times during the night. Sleep apnea disrupts your sleep and keeps your body from getting the rest that it needs. This condition can increase your risk of certain health problems, including: Heart attack. Stroke. Obesity. Type 2 diabetes. Heart failure. Irregular heartbeat. High  blood pressure. The goal of treatment is to help you breathe normally again. What are the causes? The most common cause of sleep apnea is a collapsed or blocked airway. There are three kinds of sleep apnea: Obstructive sleep apnea. This kind is caused by a blocked or collapsed airway. Central sleep apnea. This kind happens when the part of the brain that controls breathing does not send the correct signals to the muscles that control  breathing. Mixed sleep apnea. This is a combination of obstructive and central sleep apnea. What increases the risk? You are more likely to develop this condition if you: Are overweight. Smoke. Have a smaller than normal airway. Are older. Are female. Drink alcohol. Take sedatives or tranquilizers. Have a family history of sleep apnea. Have a tongue or tonsils that are larger than normal. What are the signs or symptoms? Symptoms of this condition include: Trouble staying asleep. Loud snoring. Morning headaches. Waking up gasping. Dry mouth or sore throat in the morning. Daytime sleepiness and tiredness. If you have daytime fatigue because of sleep apnea, you may be more likely to have: Trouble concentrating. Forgetfulness. Irritability or mood swings. Personality changes. Feelings of depression. Sexual dysfunction. This may include loss of interest if you are female, or erectile dysfunction if you are female. How is this diagnosed? This condition may be diagnosed with: A medical history. A physical exam. A series of tests that are done while you are sleeping (sleep study). These tests are usually done in a sleep lab, but they may also be done at home. How is this treated? Treatment for this condition aims to restore normal breathing and to ease symptoms during sleep. It may involve managing health issues that can affect breathing, such as high blood pressure or obesity. Treatment may include: Sleeping on your side. Using a decongestant if you have nasal congestion. Avoiding the use of depressants, including alcohol, sedatives, and narcotics. Losing weight if you are overweight. Making changes to your diet. Quitting smoking. Using a device to open your airway while you sleep, such as: An oral appliance. This is a custom-made mouthpiece that shifts your lower jaw forward. A continuous positive airway pressure (CPAP) device. This device blows air through a mask when you breathe  out (exhale). A nasal expiratory positive airway pressure (EPAP) device. This device has valves that you put into each nostril. A bi-level positive airway pressure (BPAP) device. This device blows air through a mask when you breathe in (inhale) and breathe out (exhale). Having surgery if other treatments do not work. During surgery, excess tissue is removed to create a wider airway. Follow these instructions at home: Lifestyle Make any lifestyle changes that your health care provider recommends. Eat a healthy, well-balanced diet. Take steps to lose weight if you are overweight. Avoid using depressants, including alcohol, sedatives, and narcotics. Do not use any products that contain nicotine or tobacco. These products include cigarettes, chewing tobacco, and vaping devices, such as e-cigarettes. If you need help quitting, ask your health care provider. General instructions Take over-the-counter and prescription medicines only as told by your health care provider. If you were given a device to open your airway while you sleep, use it only as told by your health care provider. If you are having surgery, make sure to tell your health care provider you have sleep apnea. You may need to bring your device with you. Keep all follow-up visits. This is important. Contact a health care provider if: The device that you received to  open your airway during sleep is uncomfortable or does not seem to be working. Your symptoms do not improve. Your symptoms get worse. Get help right away if: You develop: Chest pain. Shortness of breath. Discomfort in your back, arms, or stomach. You have: Trouble speaking. Weakness on one side of your body. Drooping in your face. These symptoms may represent a serious problem that is an emergency. Do not wait to see if the symptoms will go away. Get medical help right away. Call your local emergency services (911 in the [place] Do not drive yourself to the  hospital. Summary Sleep apnea is a condition in which breathing pauses or becomes shallow during sleep. The most common cause is a collapsed or blocked airway. The goal of treatment is to restore normal breathing and to ease symptoms during sleep. This information is not intended to replace advice given to you by your health care provider. Make sure you discuss any questions you have with your healthcare provider. Document Revised: 03/19/2020 Document Reviewed: 03/19/2020 Elsevier Patient Education  2022 [employer].  Fall Prevention in the Home, Adult Falls can cause injuries and can happen to people of all ages. There are many things you can do to make your home safe and to help prevent falls. Ask forhelp when making these changes. What actions can I take to prevent falls? General Instructions Use good lighting in all rooms. Replace any light bulbs that burn out. Turn on the lights in dark areas. Use night-lights. Keep items that you use often in easy-to-reach places. Lower the shelves around your home if needed. Set up your furniture so you have a clear path. Avoid moving your furniture around. Do not have throw rugs or other things on the floor that can make you trip. Avoid walking on wet floors. If any of your floors are uneven, fix them. Add color or contrast paint or tape to clearly mark and help you see: Grab bars or handrails. First and last steps of staircases. Where the edge of each step is. If you use a stepladder: Make sure that it is fully opened. Do not climb a closed stepladder. Make sure the sides of the stepladder are locked in place. Ask someone to hold the stepladder while you use it. Know where your pets are when moving through your home. What can I do in the bathroom?     Keep the floor dry. Clean up any water on the floor right away. Remove soap buildup in the tub or shower. Use nonskid mats or decals on the floor of the tub or shower. Attach bath mats  securely with double-sided, nonslip rug tape. If you need to sit down in the shower, use a plastic, nonslip stool. Install grab bars by the toilet and in the tub and shower. Do not use towel bars as grab bars. What can I do in the bedroom? Make sure that you have a light by your bed that is easy to reach. Do not use any sheets or blankets for your bed that hang to the floor. Have a firm chair with side arms that you can use for support when you get dressed. What can I do in the kitchen? Clean up any spills right away. If you need to reach something above you, use a step stool with a grab bar. Keep electrical cords out of the way. Do not use floor polish or wax that makes floors slippery. What can I do with my stairs? Do not leave any items  on the stairs. Make sure that you have a light switch at the top and the bottom of the stairs. Make sure that there are handrails on both sides of the stairs. Fix handrails that are broken or loose. Install nonslip stair treads on all your stairs. Avoid having throw rugs at the top or bottom of the stairs. Choose a carpet that does not hide the edge of the steps on the stairs. Check carpeting to make sure that it is firmly attached to the stairs. Fix carpet that is loose or worn. What can I do on the outside of my home? Use bright outdoor lighting. Fix the edges of walkways and driveways and fix any cracks. Remove anything that might make you trip as you walk through a door, such as a raised step or threshold. Trim any bushes or trees on paths to your home. Check to see if handrails are loose or broken and that both sides of all steps have handrails. Install guardrails along the edges of any raised decks and porches. Clear paths of anything that can make you trip, such as tools or rocks. Have leaves, snow, or ice cleared regularly. Use sand or salt on paths during winter. Clean up any spills in your garage right away. This includes grease or oil  spills. What other actions can I take? Wear shoes that: Have a low heel. Do not wear high heels. Have rubber bottoms. Feel good on your feet and fit well. Are closed at the toe. Do not wear open-toe sandals. Use tools that help you move around if needed. These include: Canes. Walkers. Scooters. Crutches. Review your medicines with your doctor. Some medicines can make you feel dizzy. This can increase your chance of falling. Ask your doctor what else you can do to help prevent falls. Where to find more information Centers for Disease Control and Prevention, [employer]: FootballExhibition.com.br [employer] on Aging: https://walker.com/ Contact a doctor if: You are afraid of falling at home. You feel weak, drowsy, or dizzy at home. You fall at home. Summary There are many simple things that you can do to make your home safe and to help prevent falls. Ways to make your home safe include removing things that can make you trip and installing grab bars in the bathroom. Ask for help when making these changes in your home. This information is not intended to replace advice given to you by your health care provider. Make sure you discuss any questions you have with your healthcare provider. Document Revised: 11/12/2019 Document Reviewed: 11/12/2019 Elsevier Patient Education  2022 [employer].

## 2020-11-05 ENCOUNTER — Encounter: Payer: Self-pay | Admitting: Family Medicine

## 2020-11-05 ENCOUNTER — Ambulatory Visit (INDEPENDENT_AMBULATORY_CARE_PROVIDER_SITE_OTHER): Payer: Medicare Other | Admitting: Family Medicine

## 2020-11-05 ENCOUNTER — Other Ambulatory Visit: Payer: Self-pay

## 2020-11-05 VITALS — BP 108/72 | HR 53 | Temp 98.1°F | Ht 62.0 in | Wt 138.0 lb

## 2020-11-05 DIAGNOSIS — M542 Cervicalgia: Secondary | ICD-10-CM

## 2020-11-05 DIAGNOSIS — F321 Major depressive disorder, single episode, moderate: Secondary | ICD-10-CM | POA: Diagnosis not present

## 2020-11-05 NOTE — Patient Instructions (Addendum)
It was very nice to see you today!  Please increase your Abilify to 10 mg daily.  Do this for a week.  You can then increase to 15 mg daily if you feel like you need a stronger dose.  Please check in with me in a couple of weeks to let me know how you are doing.  Take care, Dr [name]  PLEASE NOTE:  If you had any lab tests please let us know if you have not heard back within a few days. You may see your results on mychart before we have a chance to review them but we will give you a call once they are reviewed by [place]. If we ordered any referrals today, please let us know if you have not heard from their office within the next week.   Please try these tips to maintain a healthy lifestyle:  Eat at least 3 REAL meals and 1-2 snacks per day.  Aim for no more than 5 hours between eating.  If you eat breakfast, please do so within one hour of getting up.   Each meal should contain half fruits/vegetables, one quarter protein, and one quarter carbs (no bigger than a computer mouse)  Cut down on sweet beverages. This includes juice, soda, and sweet tea.   Drink at least 1 glass of water with each meal and aim for at least 8 glasses per day  Exercise at least 150 minutes every week.

## 2020-11-05 NOTE — Assessment & Plan Note (Signed)
A little bit better with addition of Abilify though feels like it to be a stronger dose.  We will increase to 10 mg daily.  Continue Wellbutrin 300 mg daily and Prozac 40 mg daily.  She can increase her Abilify to 15 mg daily if needed over the next week or so.  She will check in with me in a couple of weeks to let me know how this is doing.

## 2020-11-05 NOTE — Progress Notes (Signed)
   Anita Diaz is a 65 y.o. female who presents today for an office visit.  Assessment/Plan:  Chronic Problems Addressed Today: Depression, major, single episode, moderate (HCC) A little bit better with addition of Abilify though feels like it to be a stronger dose.  We will increase to 10 mg daily.  Continue Wellbutrin 300 mg daily and Prozac 40 mg daily.  She can increase her Abilify to 15 mg daily if needed over the next week or so.  She will check in with me in a couple of weeks to let me know how this is doing.  Neck pain May be having cervical radiculopathy.  She is following with neurology.  May be having testing for carpal tunnel syndrome soon.    Subjective:  HPI:  See A/p.         Objective:  Physical Exam: BP 108/72   Pulse (!) 53   Temp 98.1 F (36.7 C) (Temporal)   Ht 5\' 2"  (1.575 m)   Wt 138 lb (62.6 kg)   SpO2 96%   BMI 25.24 kg/m   Gen: No acute distress, resting comfortably Neuro: Grossly normal, moves all extremities Psych: Normal affect and thought content      Anita Diaz M. , MD 11/05/2020 8:21 AM

## 2020-11-05 NOTE — Assessment & Plan Note (Signed)
May be having cervical radiculopathy.  She is following with neurology.  May be having testing for carpal tunnel syndrome soon.

## 2020-11-08 ENCOUNTER — Other Ambulatory Visit: Payer: Self-pay | Admitting: Family Medicine

## 2020-11-16 ENCOUNTER — Other Ambulatory Visit: Payer: Self-pay | Admitting: *Deleted

## 2020-11-16 ENCOUNTER — Telehealth: Payer: Self-pay

## 2020-11-16 MED ORDER — ARIPIPRAZOLE 5 MG PO TABS: ORAL_TABLET | ORAL | 5 refills | Status: DC

## 2020-11-16 NOTE — Telephone Encounter (Signed)
Patient called in and stated Dr. [name] wanted her to start taking up to 15mg  Abilify but she wants its to be in 5mg  so she doesn't have to always take 15mg .

## 2020-11-16 NOTE — Telephone Encounter (Signed)
Please advise 

## 2020-11-16 NOTE — Telephone Encounter (Signed)
Rx send to pharmacy  

## 2020-11-16 NOTE — Telephone Encounter (Signed)
Ok to send in 5mg  tablets.  . [name], MD 11/16/2020 1:57 PM

## 2020-11-17 ENCOUNTER — Telehealth: Payer: Self-pay

## 2020-11-17 ENCOUNTER — Telehealth: Payer: Self-pay | Admitting: *Deleted

## 2020-11-17 NOTE — Telephone Encounter (Signed)
Call insurance for verification  Waiting for determination of approval

## 2020-11-17 NOTE — Telephone Encounter (Signed)
Is requesting clarification on quantity of aripiprazole that is being given for 30 day.

## 2020-11-17 NOTE — Telephone Encounter (Signed)
PA Key: [id number] Sent to [facility] ARIPiprazole 5MG  tablets Waiting for determination

## 2020-11-22 NOTE — Telephone Encounter (Signed)
Rx Abilify Approved on July 27 Effective from 11/17/2020 through 11/17/2021. Pharmacy notified

## 2020-11-30 ENCOUNTER — Telehealth: Payer: Self-pay | Admitting: Internal Medicine

## 2020-11-30 NOTE — Telephone Encounter (Signed)
Pt c/o swelling: STAT is pt has developed SOB within 24 hours  If swelling, where is the swelling located? Patient reports no swelling  How much weight have you gained and in what time span? 6 lbs in a month   Have you gained 3 pounds in a day or 5 pounds in a week? no  Do you have a log of your daily weights (if so, list)?   Are you currently taking a fluid pill? yes  Are you currently SOB? no  Have you traveled recently? No  Patient said she's gained 6 lbs in the last month. This is the most she has weighed in a long time.She has not had any changes in her lifestyle.  She wanted to know if one of the medications she is taking is causing the weight gain . She is not sure if it is something Dr. Ladona Ridgel prescribed or if it was something Dr. [name] prescribed. Please advise

## 2020-11-30 NOTE — Telephone Encounter (Signed)
Returned call to Pt.  Will have Pt send transmission tonight to check her fluid level with her device.  If fluid level remains low, it is probably medications prescribed by Dr. [name] that are causing weight increase.  Pt in agreement with plan.  Will continue to follow.

## 2020-12-01 NOTE — Telephone Encounter (Signed)
Remote received.  Pt's fluid level WNL.  Call placed to Pt and advised to contact Dr. [name] in regards to medications and weight gain.  Will cancel remote scheduled for 12/09/20 per Dr. Ladona Ridgel.  Pt thanked nurse for follow up.

## 2020-12-06 ENCOUNTER — Other Ambulatory Visit: Payer: Self-pay

## 2020-12-06 MED ORDER — ENTRESTO 24-26 MG PO TABS: 1.0000 | ORAL_TABLET | Freq: Two times a day (BID) | ORAL | 1 refills | Status: DC

## 2020-12-09 ENCOUNTER — Telehealth: Payer: Self-pay

## 2020-12-09 NOTE — Telephone Encounter (Signed)
Please clarify with patient. Is she referring to the Abilify?  This could potentially cause some weight gain issues.  We can adjust her dose of Wellbutrin to help mitigate this or potentially try different medication.  [name]. [name], MD 12/09/2020 4:16 PM

## 2020-12-09 NOTE — Telephone Encounter (Signed)
Pt called regarding her anxiety medication. She stated that Dr [name] but her on the medication not that long ago but she believes the medication is making her gain weight. She would like to speak to a nurse to see what she can do. She stated that she has gained about 7lbs in a month. Please Advise.

## 2020-12-09 NOTE — Telephone Encounter (Signed)
See below, any advice?

## 2020-12-10 ENCOUNTER — Telehealth: Payer: Self-pay | Admitting: Cardiology

## 2020-12-10 NOTE — Telephone Encounter (Signed)
Pt c/o medication issue:  1. Name of Medication:  sacubitril-valsartan (ENTRESTO) 24-26 MG  2. How are you currently taking this medication (dosage and times per day)?   3. Are you having a reaction (difficulty breathing--STAT)?   4. What is your medication issue?   Patient states this medication will cost her $500/90 say supply. She would like to know if we offer coupons or if someone can help her with patient assistance. Please advise.

## 2020-12-10 NOTE — Telephone Encounter (Signed)
Medication is Abilify. Patient has opted to try change medications. States she is up 7 lbs

## 2020-12-10 NOTE — Telephone Encounter (Signed)
**Note De-Identified  Obfuscation** The pt states that her Entresto 24-26 mg has gone up in price from $112/90 day supply to $500/90 day supply and that her pharmacist advised her she is in the donut hole.  We discussed Pt Asst for Entresto through [employer] pt asst [employer] and she is interested in applying as I did advise her that if approved she will receive her Sherryll Burger free of charge from them for the remainder of this year.   I gave her [employer]'s phone number and recommended that she call them with questions concerning their Entresto program and her eligibility to be approved for the program. She has no access to a printer so I advised her that if it appears that she would be eligible for approval to request that they mail her an application to her home address and that once she receives it to complete her part, obtain required documents per [employer] (If any), and to bring all to Dr Anne Fu office at [facility] at [employer] in [place] to drop off  in the front office and that we will take care of the provider page and will fax all to [employer].  She states that she took her last Entresto tablet this morning and is currently out. I have advised her that we are leaving her a bottle (2 weeks) of Entresto 24-26 mg samples in the front office for her to pick up. She thanked me for our assistance.

## 2020-12-10 NOTE — Telephone Encounter (Signed)
We can switch her Abilify to [place].  This has less issues with weight gain.  I would like for her to switch to 60 mg of Latuda once daily.  I would like for her to check in with [place] in a few weeks to let us know how this which is working.

## 2020-12-14 ENCOUNTER — Other Ambulatory Visit: Payer: Self-pay

## 2020-12-14 MED ORDER — LATUDA 60 MG PO TABS: 60.0000 mg | ORAL_TABLET | Freq: Every day | ORAL | 0 refills | Status: DC

## 2020-12-14 NOTE — Telephone Encounter (Signed)
I have sent in Latuda 60 mg once daily. Patient is aware.

## 2020-12-14 NOTE — Addendum Note (Signed)
Addended by: Laddie Aquas A on: 12/14/2020 01:14 PM   Modules accepted: Orders

## 2020-12-15 ENCOUNTER — Telehealth: Payer: Self-pay

## 2020-12-15 NOTE — Telephone Encounter (Signed)
Patient has called in stating she can not afford Latuda medication that was sent in today.    Would like to know if she can go back on adderrall?  Please advise.

## 2020-12-15 NOTE — Telephone Encounter (Signed)
Please advise 

## 2020-12-20 ENCOUNTER — Telehealth: Payer: Self-pay | Admitting: Cardiology

## 2020-12-20 NOTE — Telephone Encounter (Signed)
Patient calling the office for samples of medication:   1.  What medication and dosage are you requesting samples for? sacubitril-valsartan (ENTRESTO) 24-26 MG  2.  Are you currently out of this medication? yes   Patient states she can pick up the sample today and drop off her forms for the entresto.

## 2020-12-20 NOTE — Telephone Encounter (Signed)
**Note De-Identified  Obfuscation** The pt states that she has her completed [employer] Pt Asst application for Entresto ready to drop off at the office and is requesting to pick up a bottle of Entresto 24-26 mg samples while here as she only has 4 tablets left.  She is advised that we are leaving her 1 bottle of Entresto 24-26 mg samples in the front office for her to pick up while dropping off her [employer] Pt Asst application. She thanked me for our assistance.

## 2020-12-21 NOTE — Telephone Encounter (Signed)
Can we clarify with patient? I have not prescribed adderall before.  We can refer her to see a psychiatrist if she is interested.  [name]. [name], MD 12/21/2020 4:15 PM

## 2020-12-21 NOTE — Telephone Encounter (Signed)
Paperwork has been printed, signed by Dr Anne Fu and taken to medical records to be faxed to number on fax sheet.

## 2020-12-21 NOTE — Telephone Encounter (Signed)
**Note De-Identified  Obfuscation** The pt left her completed [employer] pt asst application for Entresto at the office. I have completed the provider page of the application and have e-mailed all to Dr Anne Fu nurse so she can obtain his signature, date it, and to fax all to [employer] pt [facility] at the fax number written on the cover letter included or to place in the to be faxed basket in Medical Records to be faxed.

## 2020-12-22 ENCOUNTER — Telehealth: Payer: Self-pay

## 2020-12-22 NOTE — Telephone Encounter (Signed)
Pt called stating that she needs to speak to a nurse. Please Advise.

## 2020-12-22 NOTE — Telephone Encounter (Signed)
Patient agreed with psychiatrist referral  Stated not taking any antidepressing medication at this time

## 2020-12-23 DIAGNOSIS — G459 Transient cerebral ischemic attack, unspecified: Secondary | ICD-10-CM

## 2020-12-23 HISTORY — DX: Transient cerebral ischemic attack, unspecified: G45.9

## 2020-12-24 NOTE — Telephone Encounter (Signed)
Patient is following up, requesting to speak with Larita Fife, LPN. She would like to know if there is an estimated approval time for [employer] Patient Assistance. Please advise.

## 2020-12-24 NOTE — Telephone Encounter (Signed)
**Note De-Identified  Obfuscation** I called [employer] and was advised that the pt has been approved for asst with Entresto until 04/23/2021 and that their pharmacy will be reaching out to her.  and to advise the pt that if she has not heard from their pharmacy by Wednesday of next week to call [employer] so they can get her signed up through their pharmacy. I called the pt and made her aware of this out come and she states that she will call them now to get signed up with their pharmacy. She thanked me for my asst.

## 2020-12-24 NOTE — Telephone Encounter (Signed)
Spoke to pt asked her how I could help. Pt said someone already spoke to her and she was just wondering about getting on Adderall? Told pt she would need to make an appointment to discuss. Pt verbalized understanding.

## 2021-01-06 NOTE — Telephone Encounter (Signed)
**Note De-Identified Anita Diaz Obfuscation** Letter received from [employer] [facility] Anita Diaz fax stating that they have approved the pt for asst with Entresto for the remainder of this year. Pt ID: [id number]  The letter states that they have notified the pt of this approval as well.

## 2021-01-10 ENCOUNTER — Encounter (HOSPITAL_BASED_OUTPATIENT_CLINIC_OR_DEPARTMENT_OTHER): Payer: Self-pay | Admitting: Emergency Medicine

## 2021-01-10 ENCOUNTER — Other Ambulatory Visit: Payer: Self-pay

## 2021-01-10 ENCOUNTER — Institutional Professional Consult (permissible substitution): Payer: Medicare Other | Admitting: Neurology

## 2021-01-10 DIAGNOSIS — Z950 Presence of cardiac pacemaker: Secondary | ICD-10-CM | POA: Diagnosis not present

## 2021-01-10 DIAGNOSIS — Z7982 Long term (current) use of aspirin: Secondary | ICD-10-CM | POA: Diagnosis not present

## 2021-01-10 DIAGNOSIS — R4182 Altered mental status, unspecified: Secondary | ICD-10-CM | POA: Diagnosis present

## 2021-01-10 DIAGNOSIS — I110 Hypertensive heart disease with heart failure: Secondary | ICD-10-CM | POA: Diagnosis not present

## 2021-01-10 DIAGNOSIS — Z87891 Personal history of nicotine dependence: Secondary | ICD-10-CM | POA: Diagnosis not present

## 2021-01-10 DIAGNOSIS — Z20822 Contact with and (suspected) exposure to covid-19: Secondary | ICD-10-CM | POA: Diagnosis not present

## 2021-01-10 DIAGNOSIS — Y900 Blood alcohol level of less than 20 mg/100 ml: Secondary | ICD-10-CM | POA: Insufficient documentation

## 2021-01-10 DIAGNOSIS — G9340 Encephalopathy, unspecified: Principal | ICD-10-CM | POA: Insufficient documentation

## 2021-01-10 DIAGNOSIS — Z79899 Other long term (current) drug therapy: Secondary | ICD-10-CM | POA: Insufficient documentation

## 2021-01-10 DIAGNOSIS — I5022 Chronic systolic (congestive) heart failure: Secondary | ICD-10-CM | POA: Insufficient documentation

## 2021-01-10 LAB — URINALYSIS, ROUTINE W REFLEX MICROSCOPIC
Bilirubin Urine: NEGATIVE
Glucose, UA: NEGATIVE mg/dL
Hgb urine dipstick: NEGATIVE
Ketones, ur: NEGATIVE mg/dL
Nitrite: NEGATIVE
Protein, ur: NEGATIVE mg/dL
Specific Gravity, Urine: 1.005 (ref 1.005–1.030)
pH: 6 (ref 5.0–8.0)

## 2021-01-10 LAB — COMPREHENSIVE METABOLIC PANEL
ALT: 15 U/L (ref 0–44)
AST: 18 U/L (ref 15–41)
Albumin: 4.3 g/dL (ref 3.5–5.0)
Alkaline Phosphatase: 72 U/L (ref 38–126)
Anion gap: 11 (ref 5–15)
BUN: 25 mg/dL — ABNORMAL HIGH (ref 8–23)
CO2: 24 mmol/L (ref 22–32)
Calcium: 9 mg/dL (ref 8.9–10.3)
Chloride: 99 mmol/L (ref 98–111)
Creatinine, Ser: 0.74 mg/dL (ref 0.44–1.00)
GFR, Estimated: >60 mL/min (ref >60)
Glucose, Bld: 91 mg/dL (ref 70–99)
Potassium: 3.8 mmol/L (ref 3.5–5.1)
Sodium: 134 mmol/L — ABNORMAL LOW (ref 135–145)
Total Bilirubin: 0.3 mg/dL (ref 0.3–1.2)
Total Protein: 7.1 g/dL (ref 6.5–8.1)

## 2021-01-10 LAB — CBC
HCT: 36.1 % (ref 36.0–46.0)
Hemoglobin: 12 g/dL (ref 12.0–15.0)
MCH: 28.7 pg (ref 26.0–34.0)
MCHC: 33.2 g/dL (ref 30.0–36.0)
MCV: 86.4 fL (ref 80.0–100.0)
Platelets: 331 10*3/uL (ref 150–400)
RBC: 4.18 MIL/uL (ref 3.87–5.11)
RDW: 12.8 % (ref 11.5–15.5)
WBC: 9.1 10*3/uL (ref 4.0–10.5)
nRBC: 0 % (ref 0.0–0.2)

## 2021-01-10 LAB — CBG MONITORING, ED: Glucose-Capillary: 94 mg/dL (ref 70–99)

## 2021-01-10 NOTE — ED Triage Notes (Addendum)
Pt presents to ED POV. Pt c/o AMS since earlier today. Pt was alone for most of day but LKN ~0745. Pt has pacemaker.  Dr. Karene Fry at bedside

## 2021-01-11 ENCOUNTER — Observation Stay (HOSPITAL_COMMUNITY): Payer: Medicare Other

## 2021-01-11 ENCOUNTER — Observation Stay (HOSPITAL_BASED_OUTPATIENT_CLINIC_OR_DEPARTMENT_OTHER)
Admission: EM | Admit: 2021-01-11 | Discharge: 2021-01-14 | Disposition: A | Discharge status: Home / Self Care | Payer: Medicare Other | Attending: Internal Medicine | Admitting: Internal Medicine

## 2021-01-11 ENCOUNTER — Emergency Department (HOSPITAL_BASED_OUTPATIENT_CLINIC_OR_DEPARTMENT_OTHER): Payer: Medicare Other

## 2021-01-11 ENCOUNTER — Encounter (HOSPITAL_COMMUNITY): Payer: Self-pay | Admitting: Internal Medicine

## 2021-01-11 DIAGNOSIS — G8929 Other chronic pain: Secondary | ICD-10-CM | POA: Diagnosis present

## 2021-01-11 DIAGNOSIS — I10 Essential (primary) hypertension: Secondary | ICD-10-CM | POA: Diagnosis present

## 2021-01-11 DIAGNOSIS — I5022 Chronic systolic (congestive) heart failure: Secondary | ICD-10-CM | POA: Diagnosis not present

## 2021-01-11 DIAGNOSIS — Z95810 Presence of automatic (implantable) cardiac defibrillator: Secondary | ICD-10-CM

## 2021-01-11 DIAGNOSIS — G454 Transient global amnesia: Secondary | ICD-10-CM

## 2021-01-11 DIAGNOSIS — I6523 Occlusion and stenosis of bilateral carotid arteries: Secondary | ICD-10-CM | POA: Diagnosis not present

## 2021-01-11 DIAGNOSIS — Z79899 Other long term (current) drug therapy: Secondary | ICD-10-CM | POA: Diagnosis not present

## 2021-01-11 DIAGNOSIS — E785 Hyperlipidemia, unspecified: Secondary | ICD-10-CM | POA: Diagnosis present

## 2021-01-11 DIAGNOSIS — Z20822 Contact with and (suspected) exposure to covid-19: Secondary | ICD-10-CM | POA: Diagnosis not present

## 2021-01-11 DIAGNOSIS — Y900 Blood alcohol level of less than 20 mg/100 ml: Secondary | ICD-10-CM | POA: Diagnosis not present

## 2021-01-11 DIAGNOSIS — I5020 Unspecified systolic (congestive) heart failure: Secondary | ICD-10-CM | POA: Diagnosis present

## 2021-01-11 DIAGNOSIS — I110 Hypertensive heart disease with heart failure: Secondary | ICD-10-CM | POA: Diagnosis not present

## 2021-01-11 DIAGNOSIS — Z950 Presence of cardiac pacemaker: Secondary | ICD-10-CM | POA: Diagnosis not present

## 2021-01-11 DIAGNOSIS — R4182 Altered mental status, unspecified: Secondary | ICD-10-CM | POA: Diagnosis not present

## 2021-01-11 DIAGNOSIS — R410 Disorientation, unspecified: Secondary | ICD-10-CM | POA: Diagnosis not present

## 2021-01-11 DIAGNOSIS — Z87891 Personal history of nicotine dependence: Secondary | ICD-10-CM | POA: Diagnosis not present

## 2021-01-11 DIAGNOSIS — G9340 Encephalopathy, unspecified: Secondary | ICD-10-CM | POA: Diagnosis present

## 2021-01-11 DIAGNOSIS — I6330 Cerebral infarction due to thrombosis of unspecified cerebral artery: Secondary | ICD-10-CM | POA: Insufficient documentation

## 2021-01-11 DIAGNOSIS — Z7982 Long term (current) use of aspirin: Secondary | ICD-10-CM | POA: Diagnosis not present

## 2021-01-11 DIAGNOSIS — F321 Major depressive disorder, single episode, moderate: Secondary | ICD-10-CM | POA: Diagnosis present

## 2021-01-11 LAB — AMMONIA: Ammonia: 47 umol/L — ABNORMAL HIGH (ref 9–35)

## 2021-01-11 LAB — RAPID URINE DRUG SCREEN, HOSP PERFORMED
Amphetamines: NOT DETECTED
Barbiturates: NOT DETECTED
Benzodiazepines: NOT DETECTED
Cocaine: NOT DETECTED
Opiates: NOT DETECTED
Tetrahydrocannabinol: NOT DETECTED

## 2021-01-11 LAB — HIV ANTIBODY (ROUTINE TESTING W REFLEX): HIV Screen 4th Generation wRfx: NONREACTIVE

## 2021-01-11 LAB — TSH: TSH: 1.547 u[IU]/mL (ref 0.350–4.500)

## 2021-01-11 LAB — RESP PANEL BY RT-PCR (FLU A&B, COVID) ARPGX2
Influenza A by PCR: NEGATIVE
Influenza B by PCR: NEGATIVE
SARS Coronavirus 2 by RT PCR: NEGATIVE

## 2021-01-11 LAB — TROPONIN I (HIGH SENSITIVITY)
Troponin I (High Sensitivity): 11 ng/L (ref <18)
Troponin I (High Sensitivity): 10 ng/L (ref <18)

## 2021-01-11 LAB — ETHANOL: Alcohol, Ethyl (B): <10 mg/dL (ref <10)

## 2021-01-11 IMAGING — CT CT ANGIO HEAD-NECK (W OR W/O PERF)
1 of 8 series · 14 of 47 positions shown · IV contrast (omnipaque)
Comparison: CT from earlier the same day.

CLINICAL DATA: Initial evaluation for stroke/TIA, assess
intracranial arteries.

EXAM:
CT ANGIOGRAPHY HEAD AND NECK
TECHNIQUE: Multidetector CT imaging of the head and neck was performed using
the standard protocol during bolus administration of intravenous
contrast. Multiplanar CT image reconstructions and MIPs were
obtained to evaluate the vascular anatomy. Carotid stenosis
measurements (when applicable) are obtained utilizing NASCET
criteria, using the distal internal carotid diameter as the
denominator.
CONTRAST:  65mL OMNIPAQUE IOHEXOL 350 MG/ML SOLN

[Series 13: thin · axial · 0.37mm/px · z∈[-225,+48]mm · 14 of 631 slices shown]
[im 43/631  brain]
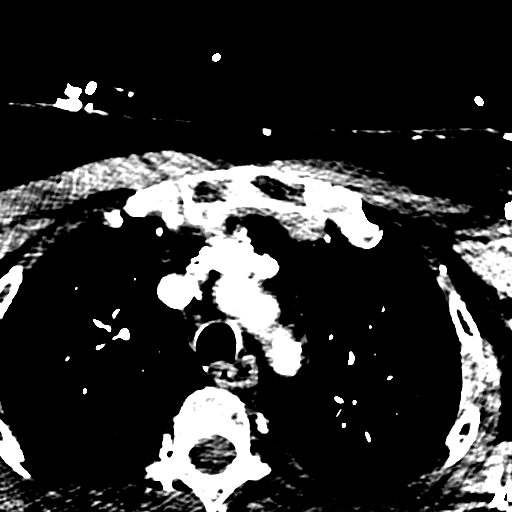
[im 85/631  bone]
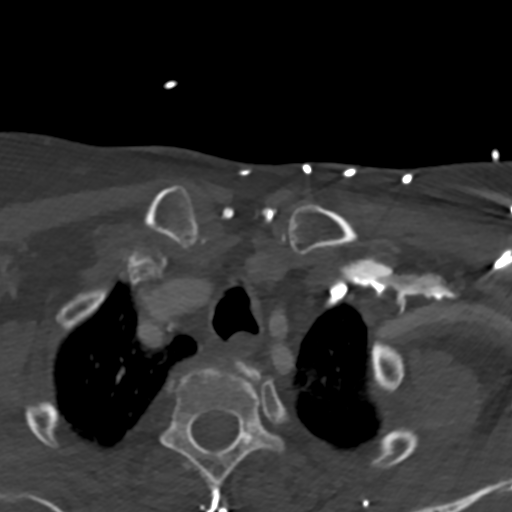
[im 127/631  brain]
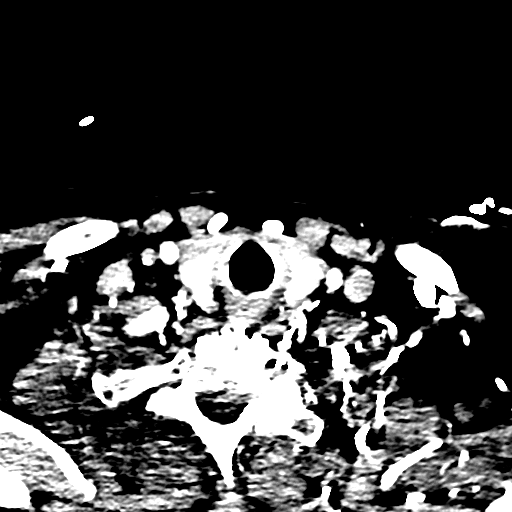
[im 169/631  bone]
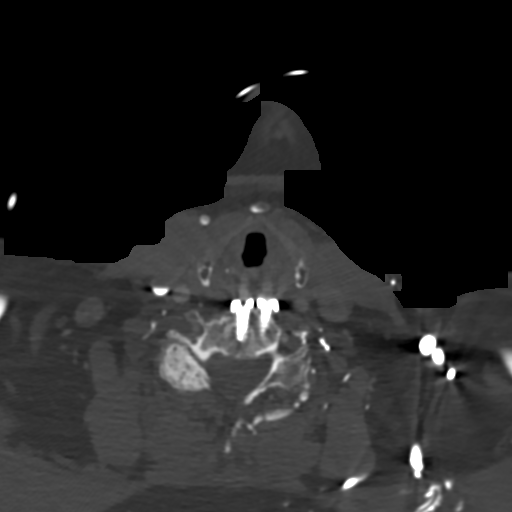
[im 211/631  brain]
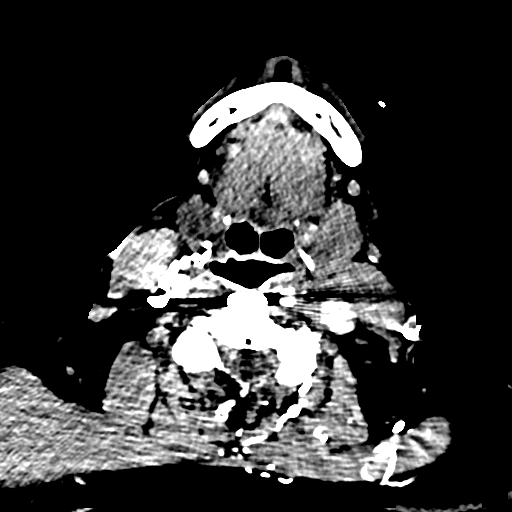
[im 253/631  bone]
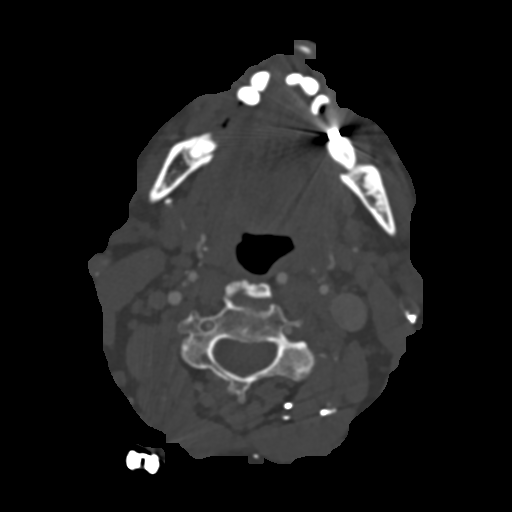
[im 295/631  brain]
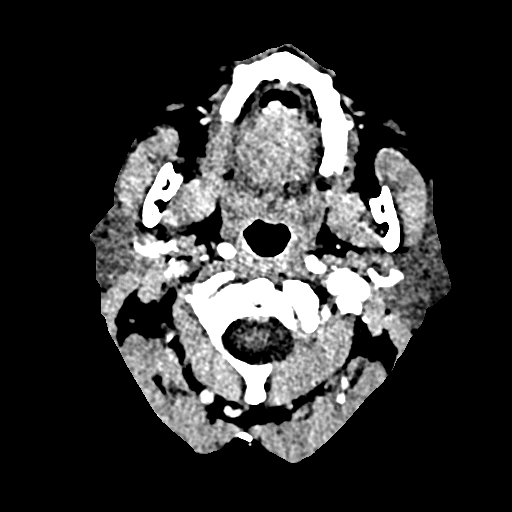
[im 337/631  bone]
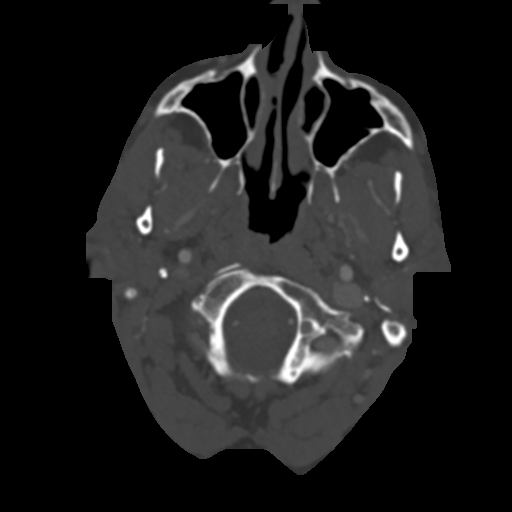
[im 379/631  brain]
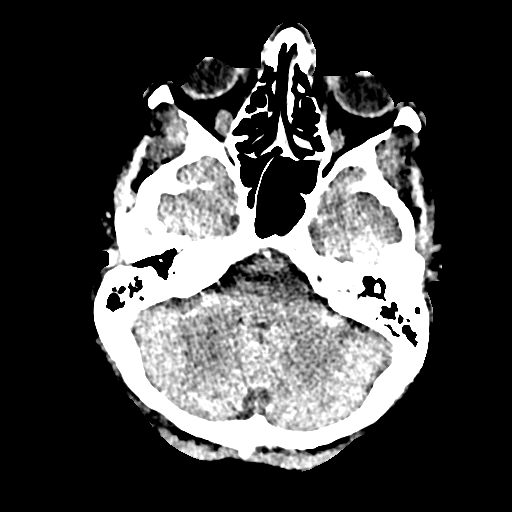
[im 421/631  bone]
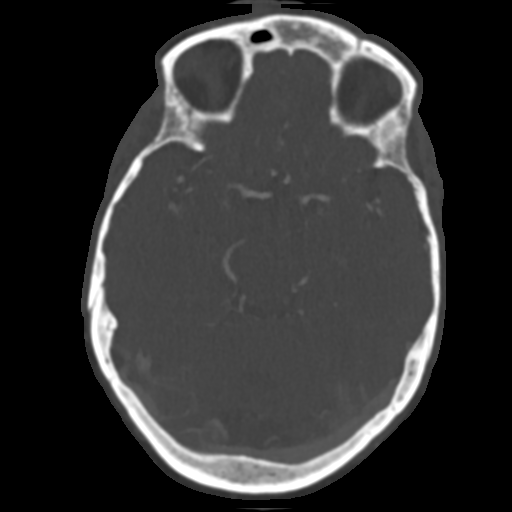
[im 463/631  brain]
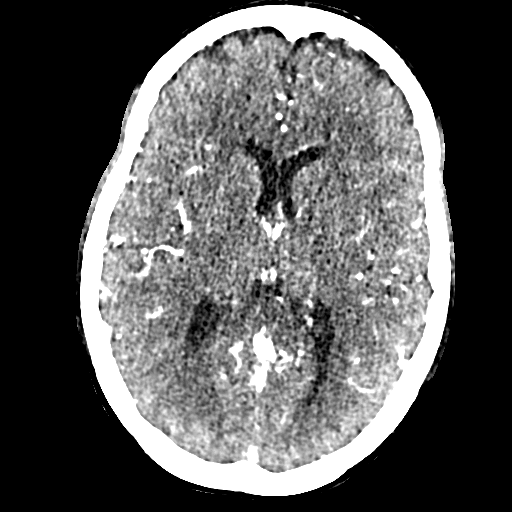
[im 505/631  bone]
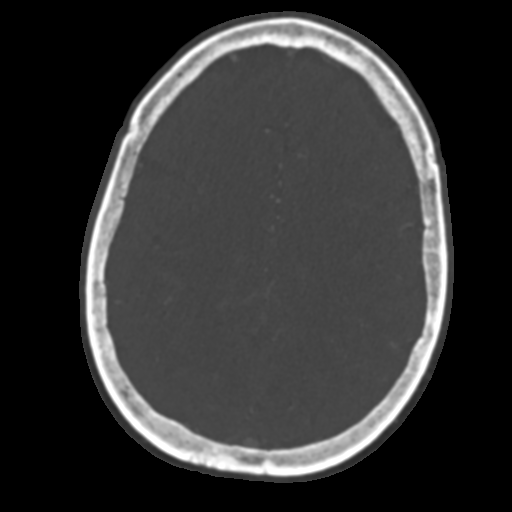
[im 547/631  brain]
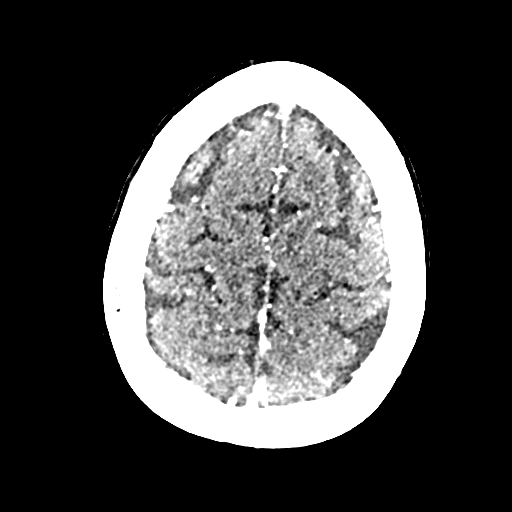
[im 589/631  bone]
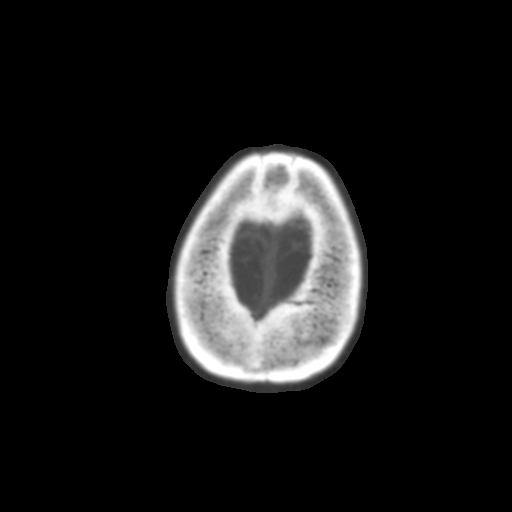

[14 of 47 positions shown; findings below may reference images not displayed]

FINDINGS: CTA NECK FINDINGS

Aortic arch: Visualized aortic arch normal in caliber with normal
branch pattern. Mild atheromatous change about the aortic arch and
origin of great vessels without significant stenosis.

Right carotid system: Right CCA patent from its origin to the
bifurcation without stenosis. Mixed plaque about the right carotid
bulb/proximal right ICA without significant stenosis. Right ICA
mildly tortuous and irregular but patent distally without stenosis,
dissection or occlusion.

Left carotid system: Left CCA patent from its origin to the
bifurcation without stenosis. Mixed plaque about the left carotid
bulb/proximal left ICA without significant stenosis. Left ICA mildly
tortuous and irregular distally but is widely patent without
stenosis, dissection or occlusion.

Vertebral arteries: Both vertebral arteries arise from the
subclavian arteries. No proximal subclavian artery stenosis.
Vertebral arteries are diminutive, and not well assessed proximally
due to adjacent venous contamination. Visualized portions of the
vertebral arteries patent without visible stenosis or other acute
vascular abnormality.

Skeleton: No visible acute osseous finding. No discrete or worrisome
osseous lesions. Prior fusion at C3 through C6.

Other neck: No other visible acute abnormality within the neck. No
mass or adenopathy. Few small left thyroid nodules measuring up to 8
mm noted, of doubtful significance given size and patient age, no
follow-up imaging recommended (ref: [HOSPITAL]. [DATE]): 143-50).

Upper chest: Visualized upper chest demonstrates no acute finding.

Review of the MIP images confirms the above findings

CTA HEAD FINDINGS

Anterior circulation: Petrous segments patent bilaterally. Scattered
atheromatous change throughout the carotid siphons bilaterally.
Short-segment moderate stenosis noted at the cavernous left ICA
(series 7, image 196). A1 segments patent bilaterally. Left A1
hypoplastic. Normal anterior communicating artery complex. Anterior
cerebral arteries patent to their distal aspects without stenosis.
No M1 stenosis or occlusion. Left M1 bifurcates early. Distal MCA
branches well perfused and symmetric.

Posterior circulation: Vertebral arteries diminutive but patent to
the vertebrobasilar junction. Both PICA origins patent and normal.
Proximal basilar artery markedly diminutive but patent. Persistent
right-sided trigeminal artery noted. Superior cerebellar arteries
patent bilaterally. Fetal type origin of both PCAs, both of which
are widely patent to their distal aspects.

Venous sinuses: Grossly patent allowing for timing the contrast
bolus.

Anatomic variants: Persistent right trigeminal artery. Fetal type
origin of the PCAs. Hypoplastic left A1 segment. No aneurysm.

Review of the MIP images confirms the above findings
IMPRESSION: 1. Negative CTA for large vessel occlusion.
2. Short-segment moderate stenosis at the cavernous left ICA.
Additional mild atheromatous change about the aortic arch and
carotid bifurcations, with no other hemodynamically significant or
correctable stenosis.
3. Persistent right-sided trigeminal artery with associated fetal
type origin of the PCAs. Associated diminutive vertebrobasilar
system.

## 2021-01-11 IMAGING — CT CT HEAD W/O CM
4 of 5 series · 16 of 47 positions shown, 18 images · non-contrast
Comparison: MRI brain [DATE]

CLINICAL DATA: Mental status change of unknown cause. Altered
mental status since earlier today.

EXAM:
CT HEAD WITHOUT CONTRAST
TECHNIQUE: Contiguous axial images were obtained from the base of the skull
through the vertex without intravenous contrast.

[Series 2: head wo · axial · 0.43mm/px · z∈[-108,-18]mm · 5 of 28 slices shown]
[im 5/28  brain]
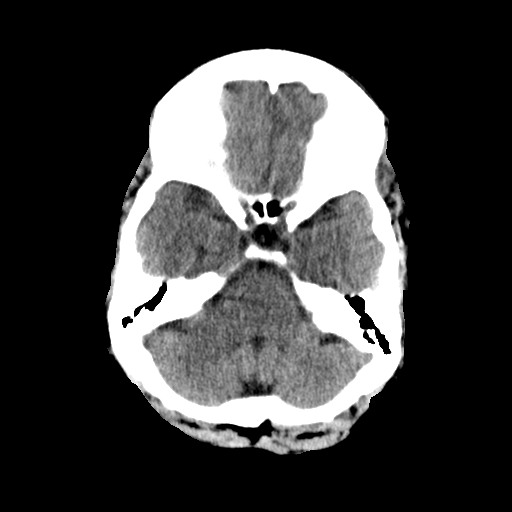
[im 10/28  brain]
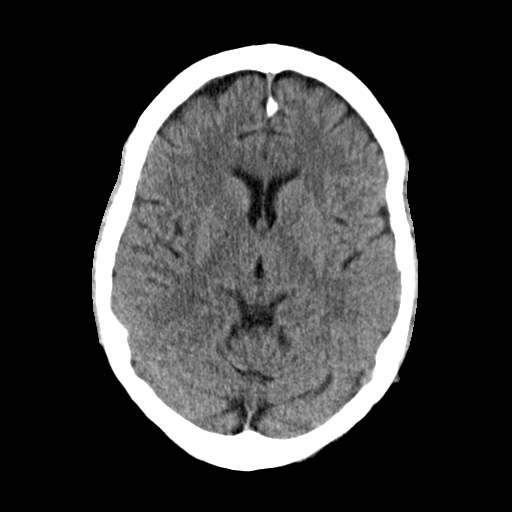
[im 14/28  brain]
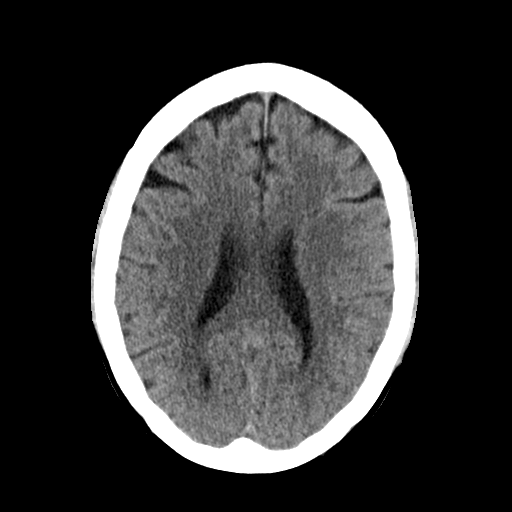
[im 19/28  brain]
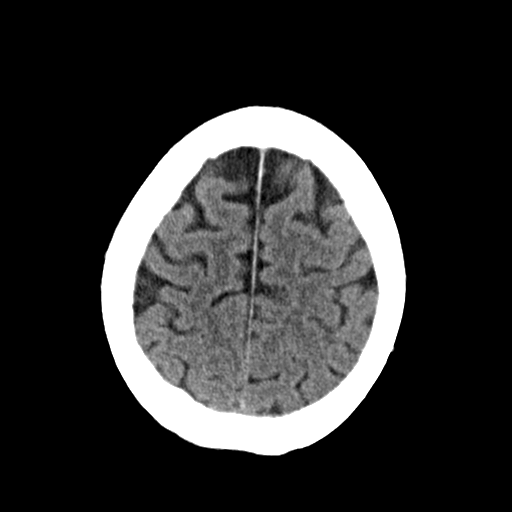
[im 23/28  brain]
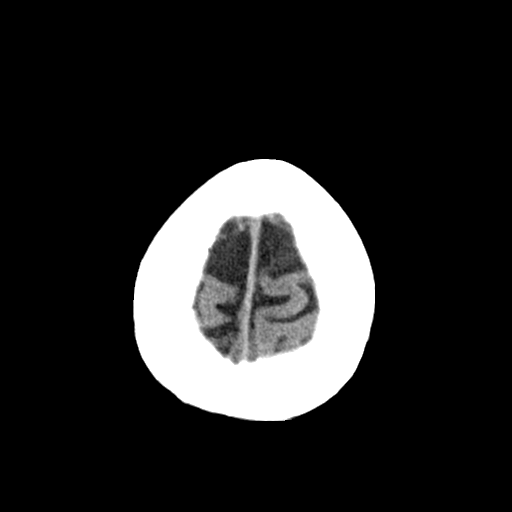

[Series 4: coronal soft · coronal · 0.30mm/px · 3 of 63 slices shown]
[im 21/63  brain]
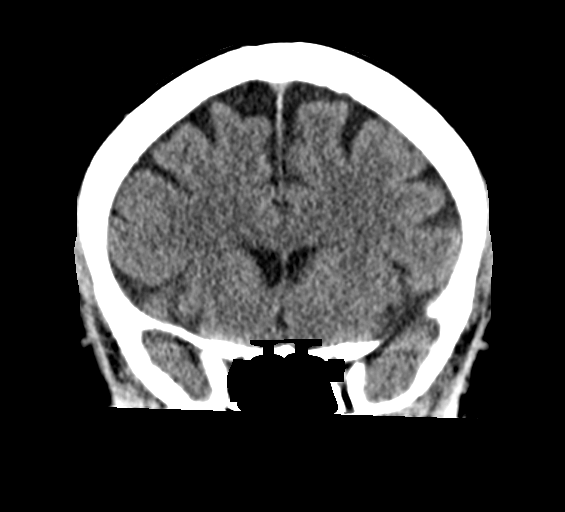
[im 28/63  brain]
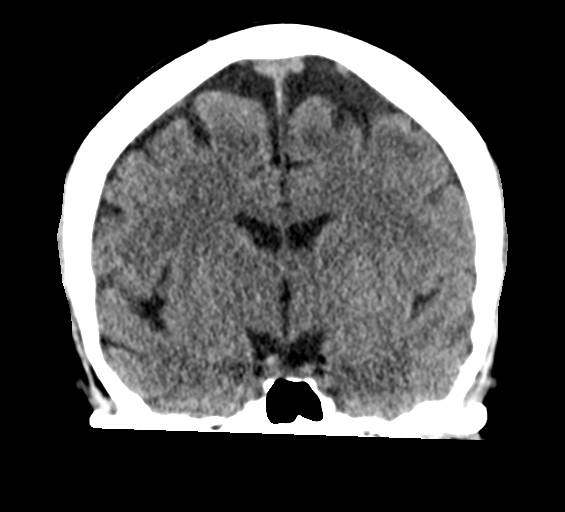
[im 35/63  brain]
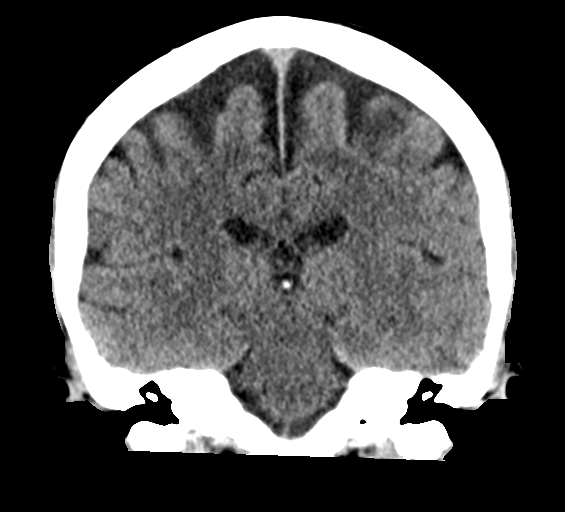

[Series 5: sagittal soft · sagittal · 0.30mm/px · 3 of 52 slices shown]
[im 18/52  brain]
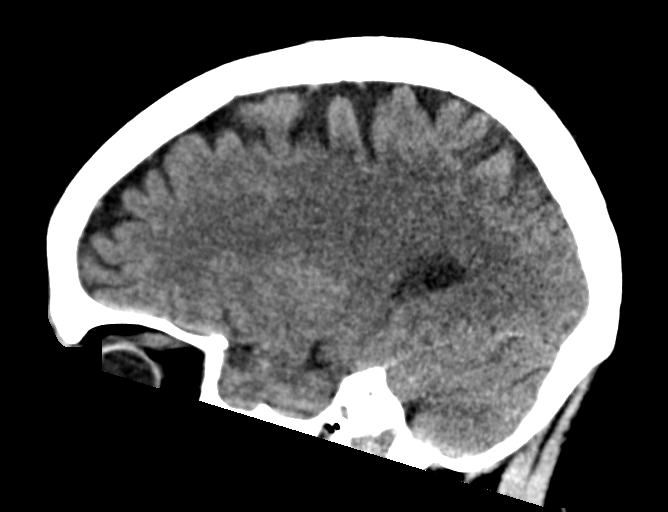
[im 26/52  brain]
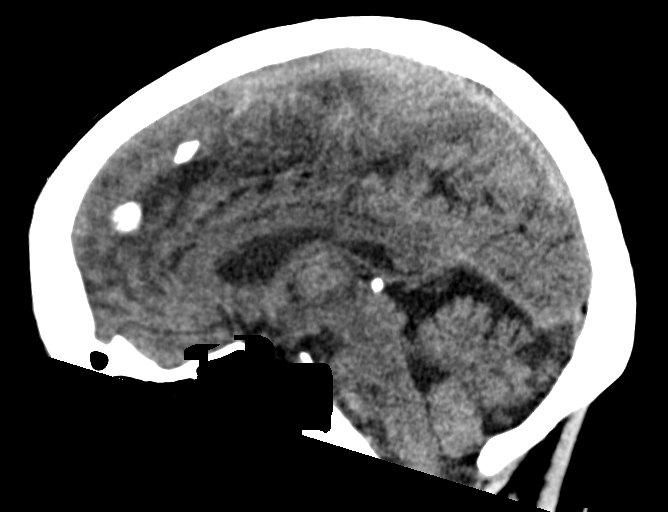
[im 35/52  brain]
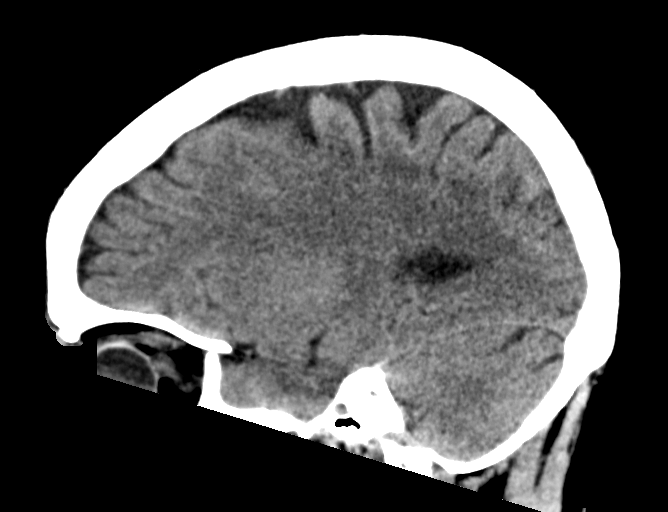

[Series 6: head wo ax · axial · 0.33mm/px · z∈[-159,-61]mm · 5 of 30 slices shown, 7 images]
[im 5/30  brain]
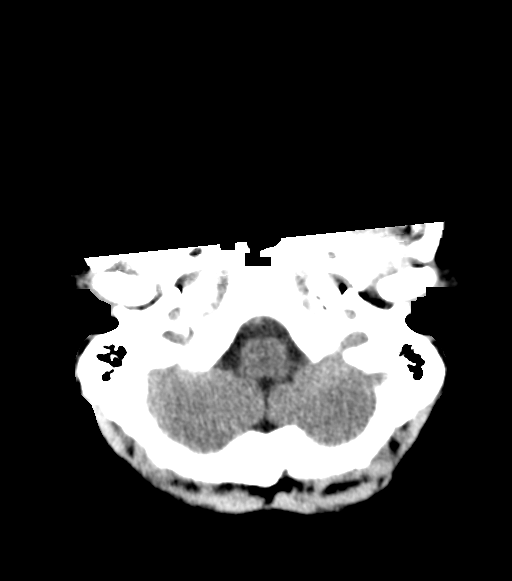
[im 5/30  bone]
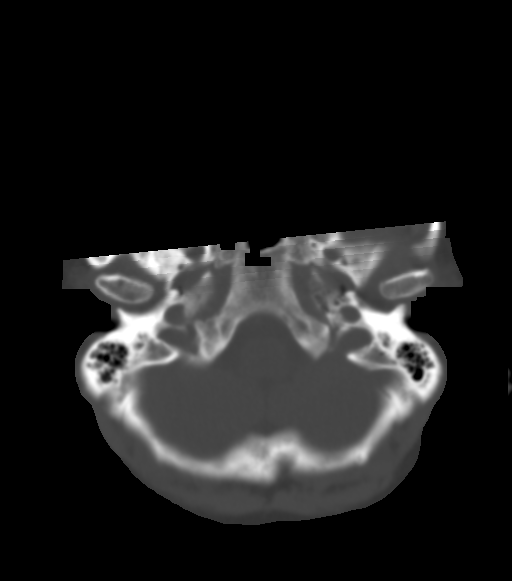
[im 10/30  brain]
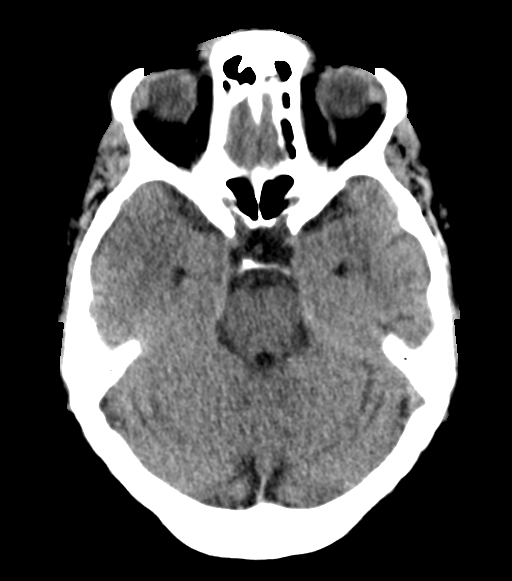
[im 15/30  brain]
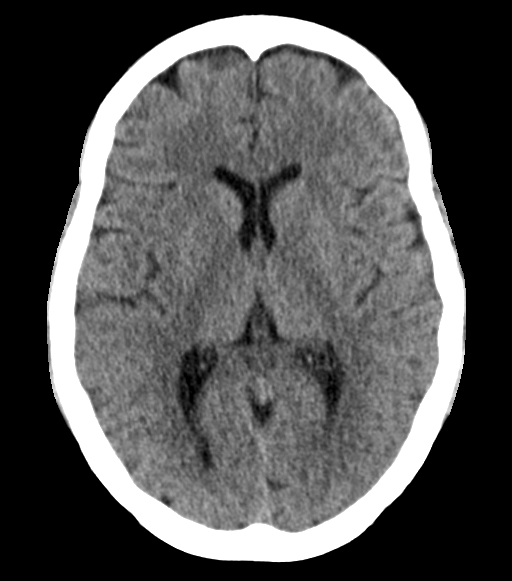
[im 20/30  brain]
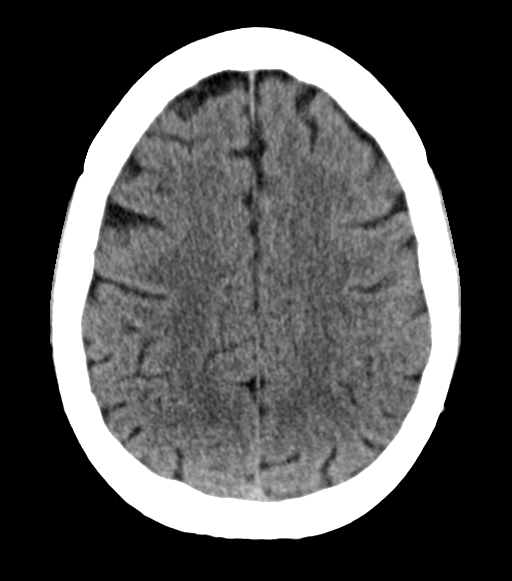
[im 25/30  brain]
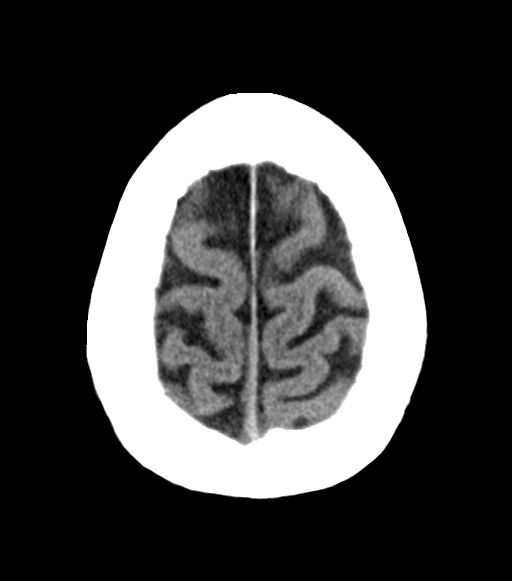
[im 25/30  bone]
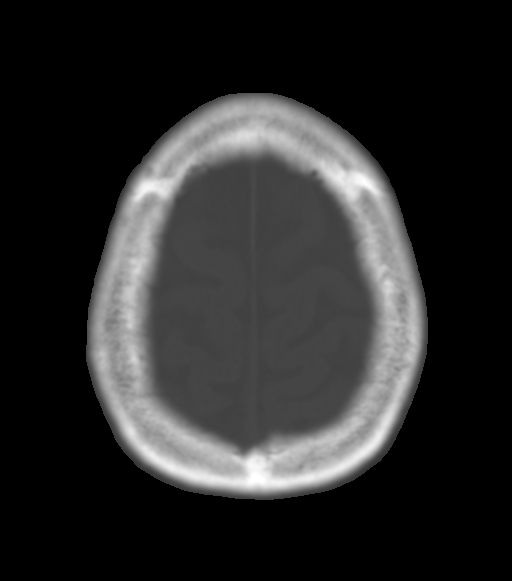

[16 of 47 positions shown; findings below may reference images not displayed]

FINDINGS: Brain: No evidence of acute infarction, hemorrhage, hydrocephalus,
extra-axial collection or mass lesion/mass effect.

Vascular: No hyperdense vessel or unexpected calcification.

Skull: Normal. Negative for fracture or focal lesion.

Sinuses/Orbits: Paranasal sinuses and mastoid air cells are clear.

Other: None.
IMPRESSION: No acute intracranial abnormalities.

## 2021-01-11 MED ORDER — FUROSEMIDE 40 MG PO TABS
40.0000 mg | ORAL_TABLET | Freq: Every day | ORAL | Status: DC
  Administered 2021-01-11 – 2021-01-14 (×4): 40 mg via ORAL
  Filled 2021-01-11 (×4): qty 1

## 2021-01-11 MED ORDER — ACETAMINOPHEN 325 MG PO TABS: 650.0000 mg | ORAL_TABLET | ORAL | Status: DC | PRN

## 2021-01-11 MED ORDER — NITROGLYCERIN 0.4 MG SL SUBL: 0.4000 mg | SUBLINGUAL_TABLET | SUBLINGUAL | Status: DC | PRN

## 2021-01-11 MED ORDER — FLUOXETINE HCL 20 MG PO CAPS
40.0000 mg | ORAL_CAPSULE | Freq: Every day | ORAL | Status: DC
  Administered 2021-01-11 – 2021-01-14 (×4): 40 mg via ORAL
  Filled 2021-01-11 (×4): qty 2

## 2021-01-11 MED ORDER — ACETAMINOPHEN 160 MG/5ML PO SOLN: 650.0000 mg | ORAL | Status: DC | PRN

## 2021-01-11 MED ORDER — STROKE: EARLY STAGES OF RECOVERY BOOK
Freq: Once | Status: AC
  Filled 2021-01-11: qty 1

## 2021-01-11 MED ORDER — LEVOCETIRIZINE DIHYDROCHLORIDE 5 MG PO TABS: 5.0000 mg | ORAL_TABLET | Freq: Every evening | ORAL | Status: DC

## 2021-01-11 MED ORDER — LURASIDONE HCL 20 MG PO TABS
60.0000 mg | ORAL_TABLET | Freq: Every day | ORAL | Status: DC
  Administered 2021-01-11 – 2021-01-14 (×4): 60 mg via ORAL
  Filled 2021-01-11 (×4): qty 3

## 2021-01-11 MED ORDER — LORATADINE 10 MG PO TABS
10.0000 mg | ORAL_TABLET | Freq: Every day | ORAL | Status: DC
  Administered 2021-01-11 – 2021-01-13 (×3): 10 mg via ORAL
  Filled 2021-01-11 (×3): qty 1

## 2021-01-11 MED ORDER — SACUBITRIL-VALSARTAN 24-26 MG PO TABS
1.0000 | ORAL_TABLET | Freq: Two times a day (BID) | ORAL | Status: DC
  Administered 2021-01-11 – 2021-01-14 (×6): 1 via ORAL
  Filled 2021-01-11 (×7): qty 1

## 2021-01-11 MED ORDER — CARVEDILOL 12.5 MG PO TABS
12.5000 mg | ORAL_TABLET | Freq: Two times a day (BID) | ORAL | Status: DC
  Administered 2021-01-12 – 2021-01-14 (×5): 12.5 mg via ORAL
  Filled 2021-01-11 (×5): qty 1

## 2021-01-11 MED ORDER — LOPERAMIDE HCL 2 MG PO CAPS
4.0000 mg | ORAL_CAPSULE | Freq: Every day | ORAL | Status: DC
  Administered 2021-01-11 – 2021-01-14 (×4): 4 mg via ORAL
  Filled 2021-01-11 (×4): qty 2

## 2021-01-11 MED ORDER — ACETAMINOPHEN 650 MG RE SUPP: 650.0000 mg | RECTAL | Status: DC | PRN

## 2021-01-11 MED ORDER — DIAZEPAM 5 MG PO TABS
5.0000 mg | ORAL_TABLET | Freq: Two times a day (BID) | ORAL | Status: DC | PRN
  Administered 2021-01-12 – 2021-01-13 (×2): 5 mg via ORAL
  Filled 2021-01-11 (×2): qty 1

## 2021-01-11 MED ORDER — ENOXAPARIN SODIUM 40 MG/0.4ML IJ SOSY
40.0000 mg | PREFILLED_SYRINGE | INTRAMUSCULAR | Status: DC
  Administered 2021-01-11 – 2021-01-13 (×3): 40 mg via SUBCUTANEOUS
  Filled 2021-01-11 (×3): qty 0.4

## 2021-01-11 MED ORDER — ASPIRIN EC 81 MG PO TBEC
81.0000 mg | DELAYED_RELEASE_TABLET | Freq: Every day | ORAL | Status: DC
  Administered 2021-01-11 – 2021-01-14 (×4): 81 mg via ORAL
  Filled 2021-01-11 (×4): qty 1

## 2021-01-11 MED ORDER — IOHEXOL 350 MG/ML SOLN
65.0000 mL | Freq: Once | INTRAVENOUS | Status: AC | PRN
  Administered 2021-01-11: 65 mL via INTRAVENOUS

## 2021-01-11 NOTE — ED Notes (Signed)
Report called to care link 

## 2021-01-11 NOTE — Plan of Care (Signed)
Pt will return to baseline prior to discharge

## 2021-01-11 NOTE — ED Notes (Signed)
Daughter at bedside, Pt ambulatory with steady gate to restroom. Pt and family updated on POC. A/OX4.
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
## 2021-01-11 NOTE — Consult Note (Addendum)
Neurology Consult H&P  Anita Diaz MR# [id number] 01/11/2021  CC: confusion  History is obtained from: patient and chart  HPI: Anita Diaz is a 65 y.o. female PMHx as reviewed below, heart failure with ICD developed acute confusion, short term memory loss and brought to hospital by her daughter.  Her memory before and after the event is intact. She remembers talking to her daughter on the phone during the event as well as being evaluated at the OSH but she does not remember all the concurrent details.  She was seen by teleneurology and thought to be TGA and recommended transfer to [facility] for stroke work-up with MRI head and MRA head and neck and EEG.  Denies visual changes, word finding difficulty  LKW: unclear tNK given: No OSW IR Thrombectomy No Modified Rankin Scale: 0-Completely asymptomatic and back to baseline post- stroke NIHSS: 0  ROS: A complete ROS was performed and is negative except as noted in the HPI.  Past Medical History:  Diagnosis Date   Bipolar 1 disorder (HCC)    Chest pain at rest 06/29/2017   Chronic pain    Depression    Depression, major, single episode, moderate (HCC)    Heart failure with reduced ejection fraction (HCC) 08/01/2017   Hyperlipidemia    Hypertension    Insomnia    Neck pain 08/29/2017   Osteoarthritis, hand 08/01/2017   Other fatigue 08/29/2017   Pacemaker 11/08/2017    Family History  Problem Relation Age of Onset   Cancer Sister        cervical   Hypertension Mother        stent 1994   Hyperlipidemia Mother    Heart disease Mother    Heart disease Father    Heart attack Father        cabg quad bypass   Thyroid disease Sister     Social History:  reports that she quit smoking about 12 years ago. Her smoking use included cigarettes. She has a 74.00 pack-year smoking history. She has never used smokeless tobacco. She reports current alcohol use. She reports that she does not use drugs.   Prior to Admission medications    Medication Sig Start Date End Date Taking? Authorizing Provider  ARIPiprazole (ABILIFY) 5 MG tablet Take 5 mg by mouth at bedtime.   Yes [provider]  aspirin EC 81 MG tablet Take 1 tablet (81 mg total) by mouth daily. 06/20/17  Yes Rosalio Macadamia, NP  carvedilol (COREG) 12.5 MG tablet TAKE 1 & 1/2 (ONE & ONE-HALF) TABLETS BY MOUTH TWICE DAILY 06/02/20  Yes Marinus Maw, MD  cyclobenzaprine (FLEXERIL) 10 MG tablet Take 1 tablet (10 mg total) by mouth 3 (three) times daily as needed for muscle spasms. 09/25/19  Yes Ardith Dark, MD  diazepam (VALIUM) 5 MG tablet Take 1 tablet (5 mg total) by mouth every 12 (twelve) hours as needed for anxiety. 10/18/20  Yes Ardith Dark, MD  FLUoxetine [facility]) 40 MG capsule Take 1 capsule by mouth once daily 11/01/20  Yes Ardith Dark, MD  furosemide (LASIX) 40 MG tablet Take 1 tablet by mouth once daily 08/03/20  Yes Marinus Maw, MD  levocetirizine (XYZAL) 5 MG tablet Take 5 mg by mouth every evening.   Yes [provider]  loperamide (IMODIUM A-D) 2 MG tablet Take 4 mg by mouth daily.   Yes [provider]  Lurasidone HCl (LATUDA) 60 MG TABS Take 1 tablet (60 mg  total) by mouth daily. 12/14/20  Yes Ardith Dark, MD  sacubitril-valsartan (ENTRESTO) 24-26 MG Take 1 tablet by mouth 2 (two) times daily. 12/06/20  Yes Jake Bathe, MD  buPROPion (WELLBUTRIN XL) 300 MG 24 hr tablet Take 1 tablet by mouth once daily Patient not taking: Reported on 01/11/2021 11/08/20   Ardith Dark, MD  nitroGLYCERIN (NITROSTAT) 0.4 MG SL tablet PLACE 1 TABLET UNDER THE TONGUE EVERY 5 MINUTES AS NEEDED FOR CHEST PAIN 01/14/19   Rosalio Macadamia, NP    Exam: Current vital signs: BP (!) 142/76   Pulse (!) 57   Temp 98.4 F (36.9 C) (Oral)   Resp 18   Ht 5\' 2"  (1.575 m)   Wt 62.6 kg   SpO2 96%   BMI 25.24 kg/m   Physical Exam  Constitutional: Appears well-developed and well-nourished.  Psych: Affect appropriate to situation Eyes:  No scleral injection HENT: No OP obstruction. Head: Normocephalic.  Cardiovascular: Normal rate and regular rhythm.  Respiratory: Effort normal, symmetric excursions bilaterally, no audible wheezing. GI: Soft.  No distension. There is no tenderness.  Skin: WDI  Neuro: Mental Status: Patient is awake, alert, oriented to person, place, month, year, and situation. Patient is able to give a clear and coherent history. Speech  fluent, intact comprehension and repetition. No signs of aphasia or neglect. Visual Fields are full. Pupils are equal, round, and reactive to light. EOMI without ptosis or diploplia.  Facial sensation is symmetric to temperature Facial movement is symmetric.  Hearing is intact to voice. Uvula midline and palate elevates symmetrically. Shoulder shrug is symmetric. Tongue is midline without atrophy or fasciculations.  Tone is normal. Bulk is normal. 5/5 strength was present in all four extremities. Sensation is symmetric to light touch and temperature in the arms and legs. Deep Tendon Reflexes: 2+ and symmetric in the biceps and patellae. Toes are downgoing bilaterally. FNF and HKS are intact bilaterally. Gait - Deferred  I have reviewed labs in epic and the pertinent results are:  Ref. Range 01/10/2021 20:06  Sodium Latest Ref Range: 135 - 145 mmol/L 134 (L)    I have reviewed the images obtained: NCT head showed no evidence of acute infarction, hemorrhage, mass lesion.  Assessment: Anita Diaz is a 65 y.o. female PMHx as above, atrial fibrillation s/p ICD (no AC) with episode of anterograde amnesia during which time she had inability to form new memories with associated "broken-record" phenomenon. Description of event is highly suggestive of transient global amnesia and she is in the age range for TGA.  Impression:  Transient global amnesia. Atria fibrillation s/p ICD   Plan: - continue aspirin 81mg  - MRI brain without contrast. - CTA head and  neck. - If imaging is positive please call neurology otherwise there is no treatment indicated. - Interrogate ICD. - Normotension.  If MRI is positive for acute ischemic event pursue stroke workup: - TTE. - Labs: HbA1c, lipid panel, TSH. - Clopidogrel 75mg  daily for 3 weeks. - Telemetry monitoring for arrhythmia. - Recommend bedside Swallow screen. - Recommend Stroke education. - Recommend PT/OT/SLP consult. - Call neurology.   Electronically signed by:  77, MD Page: 01/11/2021, 5:15 PM
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
## 2021-01-11 NOTE — Consult Note (Signed)
[facility] TeleNeurology Consult Services  Stat Consult  Date of Service:   01/11/2021 02:27:15  Diagnosis:       R41.82 - AMS (Altered Mental Status)  Impression 65 year old female with pacemaker (possibly MRI compatible) with LKW around 7:45AM yesterday morning. Her daughter left at 89AM and when she came back at night her mother was confused. She was asking repeptitive questions like what day it was or where her phone was. She was able to drive and take her pets to get groomed but has no recollection of this. NIHSS 2 for questions. Head CT showed no acute changes.  My leading consideration based on repetitive questions and preserved ability to function would be transient global amnesia. Stroke and seizure are on the differential diagnosis. We should work her up with MRI head, MRA head and neck and EEG.  I discussed with the ED.  Advanced Imaging: Advanced Imaging Not Completed because:  Her history is more suggestive of TGA. Her current history and exam are not sugegstive of LVO.   Our recommendations are outlined below.  Diagnostic Studies: Recommend MRI brain without contrast Routine MRA head without contrast and MRA Neck with contrast Transthoracic Echo with bubble study, if available  Laboratory Studies: Recommend Lipid panel Hemoglobin A1c  Medications: Initiate Aspirin 81 mg daily Permissive hypertension, Antihypertensives with prn for first 24-48 hrs post stroke onset. If BP greater than 220/120 give Labetalol IV or Vasotec IV  Nursing Recommendations: Telemetry, IV Fluids, avoid dextrose containing fluids, Maintain euglycemia Neuro checks  Head of bed 30 degrees  Consultations: Recommend Speech therapy if failed dysphagia screen Physical therapy/Occupational therapy  Disposition: Neurology will follow   Metrics: [facility] Notification Time: 01/11/2021 02:27:15 Stamp Time: 01/11/2021 02:27:15 Callback Response Time:  01/11/2021 02:32:44   ----------------------------------------------------------------------------------------------------  Chief Complaint: altered mental status  History of Present Illness: Patient is a 65 year old Female.  65 year old female with pacemaker (possibly MRI compatible) with LKW around 7:45AM yesterday morning. I was able to visit with her with her son. Her daughter left at 72AM and when she came back at night her mother was confused. She was asking repeptitive questions like what day it was or where her phone was. She was able to drive and take her pets to get groomed but has no recollection of this.  The family did not notice facial droop, extremity weakness or convulsive activity. There were no visual changes or wordfinding difficulties per the patient.  There is no history of stroke or seizure.    Anticoagulant use:  No  Antiplatelet use: No    Examination: BP(144/81), Pulse(52), Blood Glucose(91) 1A: Level of Consciousness - Alert; keenly responsive + 0 1B: Ask Month and Age - Could Not Answer Either Question Correctly + 2 1C: Blink Eyes & Squeeze Hands - Performs Both Tasks + 0 2: Test Horizontal Extraocular Movements - Normal + 0 3: Test Visual Fields - No Visual Loss + 0 4: Test Facial Palsy (Use Grimace if Obtunded) - Normal symmetry + 0 5A: Test Left Arm Motor Drift - No Drift for 10 Seconds + 0 5B: Test Right Arm Motor Drift - No Drift for 10 Seconds + 0 6A: Test Left Leg Motor Drift - No Drift for 5 Seconds + 0 6B: Test Right Leg Motor Drift - No Drift for 5 Seconds + 0 7: Test Limb Ataxia (FNF/Heel-Shin) - No Ataxia + 0 8: Test Sensation - Normal; No sensory loss + 0 9: Test Language/Aphasia - Normal; No aphasia + 0  10: Test Dysarthria - Normal + 0 11: Test Extinction/Inattention - No abnormality + 0  NIHSS Score: 2 NIHSS Free Text : She could not tell me the month or her age. She said , "I don't know."     Patient / Family was informed the  Neurology Consult would occur via TeleHealth consult by way of interactive audio and video telecommunications and consented to receiving care in this manner.  Patient is being evaluated for possible acute neurologic impairment and high probability of imminent or life - threatening deterioration.I spent total of 30 minutes providing care to this patient, including time for face to face visit via telemedicine, review of medical records, imaging studies and discussion of findings with providers, the patient and / or family.   Dr Ferdinand Cava   [facility] [phone]  Case [id number]

## 2021-01-11 NOTE — ED Notes (Signed)
Pt sleeping, family at bedside. VSS, RR even and unlabored.

## 2021-01-11 NOTE — ED Notes (Signed)
Report called to accepting Nurse [id number] [facility] RN. All questions and concerns addressed at this time.

## 2021-01-12 ENCOUNTER — Encounter (HOSPITAL_COMMUNITY): Payer: Self-pay

## 2021-01-12 DIAGNOSIS — G9340 Encephalopathy, unspecified: Secondary | ICD-10-CM | POA: Diagnosis not present

## 2021-01-12 LAB — HEMOGLOBIN A1C
Hgb A1c MFr Bld: 5.5 % (ref 4.8–5.6)
Mean Plasma Glucose: 111.15 mg/dL

## 2021-01-12 LAB — LIPID PANEL
Cholesterol: 209 mg/dL — ABNORMAL HIGH (ref 0–200)
HDL: 53 mg/dL (ref >40)
LDL Cholesterol: 123 mg/dL — ABNORMAL HIGH (ref 0–99)
Total CHOL/HDL Ratio: 3.9 RATIO
Triglycerides: 163 mg/dL — ABNORMAL HIGH (ref <150)
VLDL: 33 mg/dL (ref 0–40)

## 2021-01-12 NOTE — Progress Notes (Signed)
Progress Note    Anita Diaz  NKN:[id number] DOB: 1955-09-20  DOA: 01/11/2021 PCP: Ardith Dark, MD    Brief Narrative:     Medical records reviewed and are as summarized below:  Anita Diaz is an 65 y.o. female with medical history significant of hypertension, hyperlipidemia, heart failure with reduced ejection fraction, biventricular ICD, chronic pain, depression, bipolar 1 disorder,  who presented to ED with acute onset of confusion yesterday morning.  Assessment/Plan:   Principal Problem:   Acute encephalopathy Active Problems:   Chronic pain   Depression, major, single episode, moderate (HCC)   Hyperlipidemia   Hypertension   Heart failure with reduced ejection fraction (HCC)   Biventricular ICD (implantable cardioverter-defibrillator) in place   Acute encephalopathy -65 year old presenting with acute onset encephalopathy x1 day that has seemed to have resolved.  Large differential. ?amnesia vs. Tia vs. Metabolic process vs. Medication.  -Work-up Thus far has been uneventful.  Neurology consulted and following for work-up of stroke/TIA:   Impression:  Transient global amnesia. Atria fibrillation s/p ICD   Plan: - continue aspirin 81mg  - MRI brain without contrast. - CTA head and neck. - If imaging is positive please call neurology otherwise there is no treatment indicated. - Interrogate ICD. - Normotension.   If MRI is positive for acute ischemic event pursue stroke workup: - TTE. - Labs: HbA1c, lipid panel, TSH. - Clopidogrel 75mg  daily for 3 weeks. - Telemetry monitoring for arrhythmia. - Recommend bedside Swallow screen. - Recommend Stroke education. - Recommend PT/OT/SLP consult. - Call neurology. -Echo done in January 2022.  Last lipid panel in January 2021 with an LDL of 109 -Does not appear to have an infectious cause she has no white count/fever and urine looks clear. -UDS is negative, metabolic labs have been uneventful  -? medication  misadventure as she is on multiple drugs for her depression.  Per office visit note in July with PCP she should be on Wellbutrin but states she is not taking this.  She also states she takes Abilify as needed however there is notes in her chart that this was approved for a year and she should be taking daily and she is also on February 2021.  She also takes Valium as needed and cyclobenzaprine as needed. -will hold her Flexeril and her Abilify.     Hyperlipidemia -She is not on no statin. ASCVD risk is 6.5%.  -Follow-up on stroke work-up and fasting lipid panel.      Heart failure with reduced ejection fraction (HCC) -She has no signs or symptoms of CHF exacerbation and is euvolemic on exam. -Echo in January 2022 showed an EF of 45 to 50% with mildly decreased left ventricular function grade 1 diastolic dysfunction. -Continue her carvedilol and her Entresto -Monitor intake and output and daily weights.     Biventricular ICD (implantable cardioverter-defibrillator) in place -Followed outpatient by Dr. Jordan    Depression, major, single episode, moderate (HCC)  -On multiple medications for her depression and unsure if taking as prescribed as discussed above. -We will continue her Prozac, and Latuda.  She states she has not been taking her Wellbutrin so will not continue however per office notes it seems that she is supposed to be on this. -Holding Abilify as she takes this on an as-needed basis and is already on February 2022.     Family Communication/Anticipated D/C date and plan/Code Status   DVT prophylaxis: Lovenox ordered. Code Status: Full Code.  Disposition Plan: Status is: Observation  The patient remains OBS appropriate and will d/c before 2 midnights.  Dispo: The patient is from: Home              Anticipated d/c is to: Home              Patient currently is not medically stable to d/c.- AWAIT MRI   Difficult to place patient No         Medical Consultants:    Neurology  Subjective:   Wants to go home  Objective:    Vitals:   01/12/21 0404 01/12/21 0735 01/12/21 0815 01/12/21 1123  BP: (!) 100/50 120/82 124/72 109/74  Pulse: (!) 52 73 63 70  Resp: 17 20  16   Temp: 98.3 F (36.8 C) 97.8 F (36.6 C)  98 F (36.7 C)  TempSrc: Oral Oral  Oral  SpO2: 90% 90%  92%  Weight:      Height:        Intake/Output Summary (Last 24 hours) at 01/12/2021 1239 Last data filed at 01/12/2021 0018 Gross per 24 hour  Intake 240 ml  Output 400 ml  Net -160 ml   Filed Weights   01/11/21 1000  Weight: 62.6 kg    Exam:  General: Appearance:     Overweight female in no acute distress     Lungs:     respirations unlabored  Heart:    Normal heart rate   MS:   All extremities are intact.    Neurologic:   Awake, alert, oriented x 3. No apparent focal neurological           defect.      Data Reviewed:   I have personally reviewed following labs and imaging studies:  Labs: Labs show the following:   Basic Metabolic Panel: Recent Labs  Lab 01/10/21 2006  NA 134*  K 3.8  CL 99  CO2 24  GLUCOSE 91  BUN 25*  CREATININE 0.74  CALCIUM 9.0   GFR Estimated Creatinine Clearance: 61.8 mL/min (by C-G formula based on SCr of 0.74 mg/dL). Liver Function Tests: Recent Labs  Lab 01/10/21 2006  AST 18  ALT 15  ALKPHOS 72  BILITOT 0.3  PROT 7.1  ALBUMIN 4.3   No results for input(s): LIPASE, AMYLASE in the last 168 hours. Recent Labs  Lab 01/11/21 0223  AMMONIA 47*   Coagulation profile No results for input(s): INR, PROTIME in the last 168 hours.  CBC: Recent Labs  Lab 01/10/21 2006  WBC 9.1  HGB 12.0  HCT 36.1  MCV 86.4  PLT 331   Cardiac Enzymes: No results for input(s): CKTOTAL, CKMB, CKMBINDEX, TROPONINI in the last 168 hours. BNP (last 3 results) No results for input(s): PROBNP in the last 8760 hours. CBG: Recent Labs  Lab 01/10/21 1957  GLUCAP 94   D-Dimer: No results for input(s): DDIMER in the last 72  hours. Hgb A1c: Recent Labs    01/12/21 0219  HGBA1C 5.5   Lipid Profile: Recent Labs    01/12/21 0219  CHOL 209*  HDL 53  LDLCALC 123*  TRIG 163*  CHOLHDL 3.9   Thyroid function studies: Recent Labs    01/11/21 1930  TSH 1.547   Anemia work up: No results for input(s): VITAMINB12, FOLATE, FERRITIN, TIBC, IRON, RETICCTPCT in the last 72 hours. Sepsis Labs: Recent Labs  Lab 01/10/21 2006  WBC 9.1    Microbiology Recent Results (from the past 240 hour(s))  Resp Panel by RT-PCR (Flu A&B, Covid)  Nasopharyngeal Swab     Status: None   Collection Time: 01/11/21  2:23 AM   Specimen: Nasopharyngeal Swab; Nasopharyngeal(NP) swabs in vial transport medium  Result Value Ref Range Status   SARS Coronavirus 2 by RT PCR NEGATIVE NEGATIVE Final    Comment: (NOTE) SARS-CoV-2 target nucleic acids are NOT DETECTED.  The SARS-CoV-2 RNA is generally detectable in upper respiratory specimens during the acute phase of infection. The lowest concentration of SARS-CoV-2 viral copies this assay can detect is 138 copies/mL. A negative result does not preclude SARS-Cov-2 infection and should not be used as the sole basis for treatment or other patient management decisions. A negative result may occur with  improper specimen collection/handling, submission of specimen other than nasopharyngeal swab, presence of viral mutation(s) within the areas targeted by this assay, and inadequate number of viral copies(<138 copies/mL). A negative result must be combined with clinical observations, patient history, and epidemiological information. The expected result is Negative.  Fact Sheet for Patients:  BloggerCourse.com  Fact Sheet for Healthcare Providers:  SeriousBroker.it  This test is no t yet approved or cleared by the [place] FDA and  has been authorized for detection and/or diagnosis of SARS-CoV-2 by FDA under an Emergency Use  Authorization (EUA). This EUA will remain  in effect (meaning this test can be used) for the duration of the COVID-19 declaration under Section 564(b)(1) of the Act, 21 U.S.C.section 360bbb-3(b)(1), unless the authorization is terminated  or revoked sooner.       Influenza A by PCR NEGATIVE NEGATIVE Final   Influenza B by PCR NEGATIVE NEGATIVE Final    Comment: (NOTE) The Xpert Xpress SARS-CoV-2/FLU/RSV plus assay is intended as an aid in the diagnosis of influenza from Nasopharyngeal swab specimens and should not be used as a sole basis for treatment. Nasal washings and aspirates are unacceptable for Xpert Xpress SARS-CoV-2/FLU/RSV testing.  Fact Sheet for Patients: BloggerCourse.com  Fact Sheet for Healthcare Providers: SeriousBroker.it  This test is not yet approved or cleared by the [place] FDA and has been authorized for detection and/or diagnosis of SARS-CoV-2 by FDA under an Emergency Use Authorization (EUA). This EUA will remain in effect (meaning this test can be used) for the duration of the COVID-19 declaration under Section 564(b)(1) of the Act, 21 U.S.C. section 360bbb-3(b)(1), unless the authorization is terminated or revoked.  Performed at [facility], [address], [place], [place] [address]     Procedures and diagnostic studies:  CT ANGIO HEAD NECK W WO CM  Result Date: 01/11/2021 CLINICAL DATA:  Initial evaluation for stroke/TIA, assess intracranial arteries. EXAM: CT ANGIOGRAPHY HEAD AND NECK TECHNIQUE: Multidetector CT imaging of the head and neck was performed using the standard protocol during bolus administration of intravenous contrast. Multiplanar CT image reconstructions and MIPs were obtained to evaluate the vascular anatomy. Carotid stenosis measurements (when applicable) are obtained utilizing NASCET criteria, using the distal internal carotid diameter as the denominator.  CONTRAST:  6mL OMNIPAQUE IOHEXOL 350 MG/ML SOLN COMPARISON:  CT from earlier the same day. FINDINGS: CTA NECK FINDINGS Aortic arch: Visualized aortic arch normal in caliber with normal branch pattern. Mild atheromatous change about the aortic arch and origin of great vessels without significant stenosis. Right carotid system: Right CCA patent from its origin to the bifurcation without stenosis. Mixed plaque about the right carotid bulb/proximal right ICA without significant stenosis. Right ICA mildly tortuous and irregular but patent distally without stenosis, dissection or occlusion. Left carotid system: Left CCA patent from  its origin to the bifurcation without stenosis. Mixed plaque about the left carotid bulb/proximal left ICA without significant stenosis. Left ICA mildly tortuous and irregular distally but is widely patent without stenosis, dissection or occlusion. Vertebral arteries: Both vertebral arteries arise from the subclavian arteries. No proximal subclavian artery stenosis. Vertebral arteries are diminutive, and not well assessed proximally due to adjacent venous contamination. Visualized portions of the vertebral arteries patent without visible stenosis or other acute vascular abnormality. Skeleton: No visible acute osseous finding. No discrete or worrisome osseous lesions. Prior fusion at C3 through C6. Other neck: No other visible acute abnormality within the neck. No mass or adenopathy. Few small left thyroid nodules measuring up to 8 mm noted, of doubtful significance given size and patient age, no follow-up imaging recommended (ref: J Am Coll Radiol. 2015 Feb;12(2): 143-50). Upper chest: Visualized upper chest demonstrates no acute finding. Review of the MIP images confirms the above findings CTA HEAD FINDINGS Anterior circulation: Petrous segments patent bilaterally. Scattered atheromatous change throughout the carotid siphons bilaterally. Short-segment moderate stenosis noted at the cavernous  left ICA (series 7, image 196). A1 segments patent bilaterally. Left A1 hypoplastic. Normal anterior communicating artery complex. Anterior cerebral arteries patent to their distal aspects without stenosis. No M1 stenosis or occlusion. Left M1 bifurcates early. Distal MCA branches well perfused and symmetric. Posterior circulation: Vertebral arteries diminutive but patent to the vertebrobasilar junction. Both PICA origins patent and normal. Proximal basilar artery markedly diminutive but patent. Persistent right-sided trigeminal artery noted. Superior cerebellar arteries patent bilaterally. Fetal type origin of both PCAs, both of which are widely patent to their distal aspects. Venous sinuses: Grossly patent allowing for timing the contrast bolus. Anatomic variants: Persistent right trigeminal artery. Fetal type origin of the PCAs. Hypoplastic left A1 segment. No aneurysm. Review of the MIP images confirms the above findings IMPRESSION: 1. Negative CTA for large vessel occlusion. 2. 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 Additional mild atheromatous change about the aortic arch and carotid bifurcations, with no other hemodynamically significant or correctable stenosis. 3. 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 Associated diminutive vertebrobasilar system. Electronically Signed   By: Rise Mu M.D.   On: 01/11/2021 22:18   CT HEAD WO CONTRAST ( )  Result Date: 01/11/2021 CLINICAL DATA:  Mental status change of unknown cause. Altered mental status since earlier today. EXAM: CT HEAD WITHOUT CONTRAST TECHNIQUE: Contiguous axial images were obtained from the base of the skull through the vertex without intravenous contrast. COMPARISON:  MRI brain 08/09/2020 FINDINGS: Brain: No evidence of acute infarction, hemorrhage, hydrocephalus, extra-axial collection or mass lesion/mass effect. Vascular: No hyperdense vessel or unexpected  calcification. Skull: Normal. Negative for fracture or focal lesion. Sinuses/Orbits: Paranasal sinuses and mastoid air cells are clear. Other: None. IMPRESSION: No acute intracranial abnormalities. Electronically Signed   By: Burman Nieves M.D.   On: 01/11/2021 02:42    Medications:    aspirin EC  81 mg Oral Daily   carvedilol  12.5 mg Oral BID WC   enoxaparin (LOVENOX) injection  40 mg Subcutaneous Q24H   FLUoxetine  40 mg Oral Daily   furosemide  40 mg Oral Daily   loperamide  4 mg Oral Daily   loratadine  10 mg Oral q1800   lurasidone  60 mg Oral Daily   sacubitril-valsartan  1 tablet Oral BID   Continuous Infusions:   LOS: 0 days   Joseph Art  [facility]   How to  contact the [facility] Attending or Consulting provider 7A - 7P or covering provider during after hours 7P -7A, for this patient?  Check the care team in [facility] and look for a) attending/consulting [facility] provider listed and b) the [facility] team listed Log into [email or web address] and use [place]'s universal password to access. If you do not have the password, please contact the hospital operator. Locate the [facility] provider you are looking for under [facility] and page to a number that you can be directly reached. If you still have difficulty reaching the provider, please page the [facility] (Director on Call) for the Hospitalists listed on amion for assistance.  01/12/2021, 12:39 PM

## 2021-01-12 NOTE — TOC Initial Note (Signed)
Transition of Care [facility]) - Initial/Assessment Note    Patient Details  Name: Anita Diaz MRN: [id number] Date of Birth: 11/25/1955  Transition of [facility]) CM/SW Contact:    Lawerance Sabal, RN Phone Number: 01/12/2021, 11:31 AM  Clinical Narrative:          Sherron Monday w patient at bedside. She is fully awake and oriented. She states that she lives with her daughter, is independent, drives, and expresses no barriers to access to medical care or prescriptions. No TOC needs identified.           Expected Discharge Plan: Home/Self Care Barriers to Discharge: Continued Medical Work up (pending MRI)   Patient Goals and CMS Choice Patient states their goals for this hospitalization and ongoing recovery are:: to go home to my dogs   Choice offered to / list presented to : NA  Expected Discharge Plan and Services Expected Discharge Plan: Home/Self Care                                              Prior Living Arrangements/Services                       Activities of Daily Living Home Assistive Devices/Equipment: None ADL Screening (condition at time of admission) Patient's cognitive ability adequate to safely complete daily activities?: Yes Is the patient deaf or have difficulty hearing?: No Does the patient have difficulty seeing, even when wearing glasses/contacts?: No Does the patient have difficulty concentrating, remembering, or making decisions?: No Patient able to express need for assistance with ADLs?: No Does the patient have difficulty dressing or bathing?: No Independently performs ADLs?: Yes (appropriate for developmental age) Does the patient have difficulty walking or climbing stairs?: No Weakness of Legs: None Weakness of Arms/Hands: None  Permission Sought/Granted                  Emotional Assessment              Admission diagnosis:  Confusion [R41.0] Acute encephalopathy [G93.40] Patient Active Problem List   Diagnosis Date  Noted   Acute encephalopathy 01/11/2021   Urge incontinence 10/20/2019   Left bundle branch block 06/17/2019   Biventricular ICD (implantable cardioverter-defibrillator) in place 06/17/2019   SVT (supraventricular tachycardia) (HCC) 06/17/2019   TMJ syndrome 03/01/2018   Chronic systolic heart failure (HCC) 11/08/2017   B12 deficiency 10/30/2017   Chronic left shoulder pain 08/29/2017   Neck pain 08/29/2017   Other fatigue 08/29/2017   Heart failure with reduced ejection fraction (HCC) 08/01/2017   Osteoarthritis, hand 08/01/2017   Chest pain at rest 06/29/2017   Diarrhea 06/13/2017   Chronic pain    Depression, major, single episode, moderate (HCC)    Insomnia    Hyperlipidemia    Hypertension    PCP:  Ardith Dark, MD Pharmacy:   [facility] [address] [place] - [address] [place] [address] Phone: [phone] Fax: [phone]     Social Determinants of Health (SDOH) Interventions    Readmission Risk Interventions No flowsheet data found.

## 2021-01-12 NOTE — Care Management Obs Status (Signed)
MEDICARE OBSERVATION STATUS NOTIFICATION   Patient Details  Name: Anita Diaz MRN: [id number] Date of Birth: 10-06-1955   Medicare Observation Status Notification Given:  Yes    Lawerance Sabal, RN 01/12/2021, 11:30 AM

## 2021-01-13 ENCOUNTER — Observation Stay (HOSPITAL_COMMUNITY): Payer: Medicare Other

## 2021-01-13 DIAGNOSIS — I6330 Cerebral infarction due to thrombosis of unspecified cerebral artery: Secondary | ICD-10-CM | POA: Insufficient documentation

## 2021-01-13 DIAGNOSIS — G9340 Encephalopathy, unspecified: Secondary | ICD-10-CM | POA: Diagnosis not present

## 2021-01-13 DIAGNOSIS — R4182 Altered mental status, unspecified: Secondary | ICD-10-CM | POA: Diagnosis not present

## 2021-01-13 IMAGING — MR MR HEAD W/O CM
9 of 10 series · 39 of 48 positions shown · non-contrast
Comparison: [DATE]

CLINICAL DATA: Mental status change, persistent or worsening

EXAM:
MRI HEAD WITHOUT CONTRAST
TECHNIQUE: Multiplanar, multiecho pulse sequences of the brain and surrounding
structures were obtained without intravenous contrast.

[Series 3: DWI · axial · 3.0mm · 1.09mm/px · z∈[-79,+68]mm · 11 of 100 slices shown (1 of 4)]
[im 1/100]
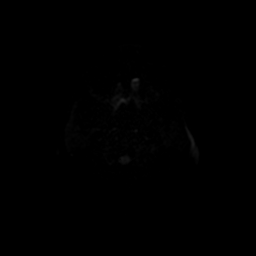
[im 10/100]
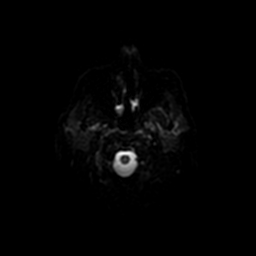
[im 20/100]
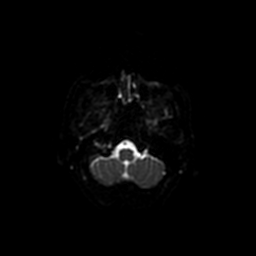
[im 30/100]
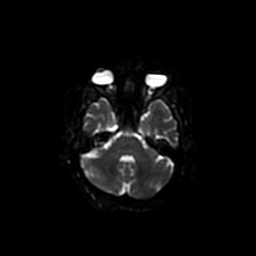
[im 40/100]
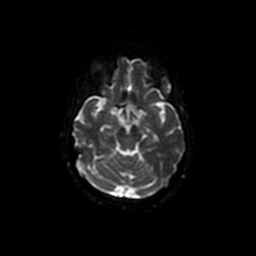
[im 50/100]
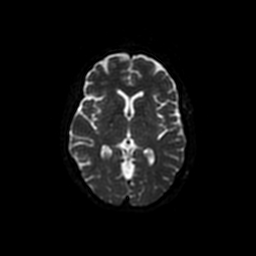
[im 60/100]
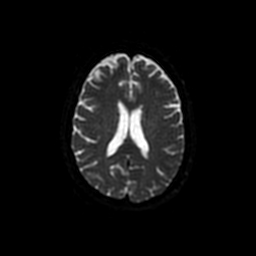
[im 70/100]
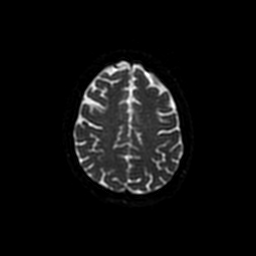
[im 80/100]
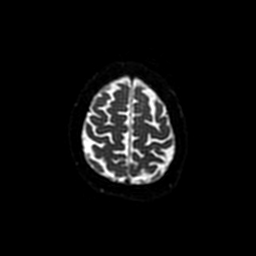
[im 90/100]
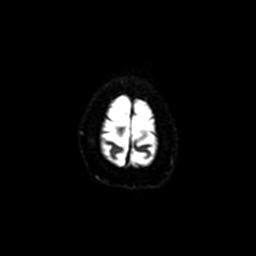
[im 100/100]
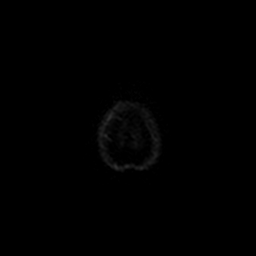

[Series 4: DWI · coronal · 5.0mm · 1.09mm/px · 8 of 76 slices shown (2 of 4)]
[im 1/76]
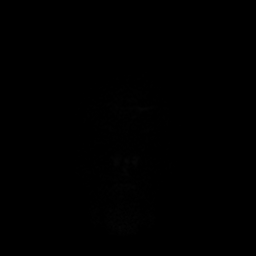
[im 11/76]
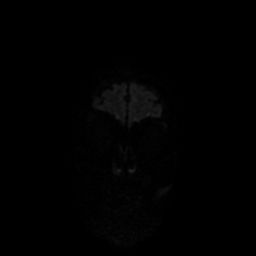
[im 22/76]
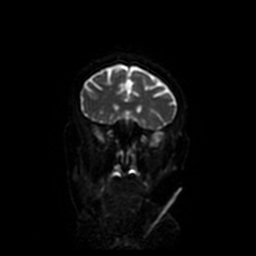
[im 33/76]
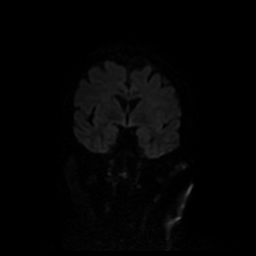
[im 43/76]
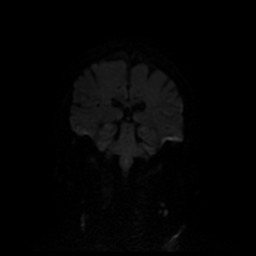
[im 54/76]
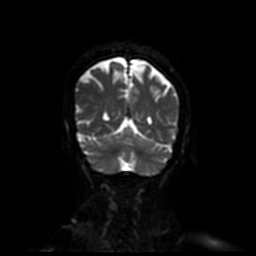
[im 65/76]
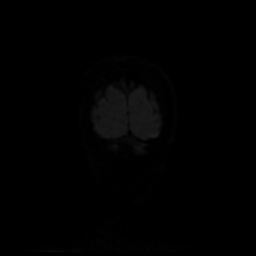
[im 76/76]
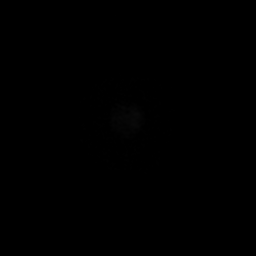

[Series 5: T1 · sagittal · 5.0mm · 0.47mm/px · 2 of 23 slices shown (1 of 2)]
[im 1/23]
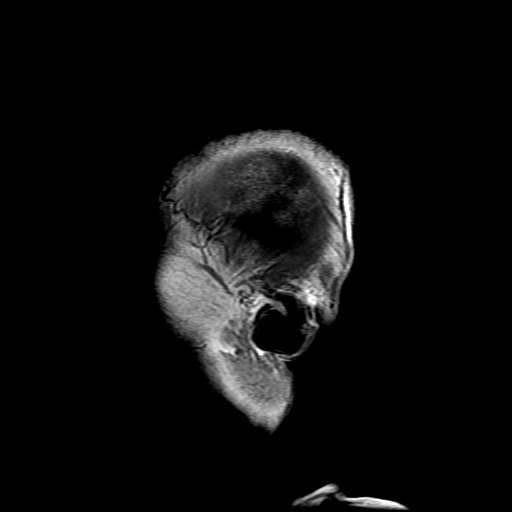
[im 23/23]
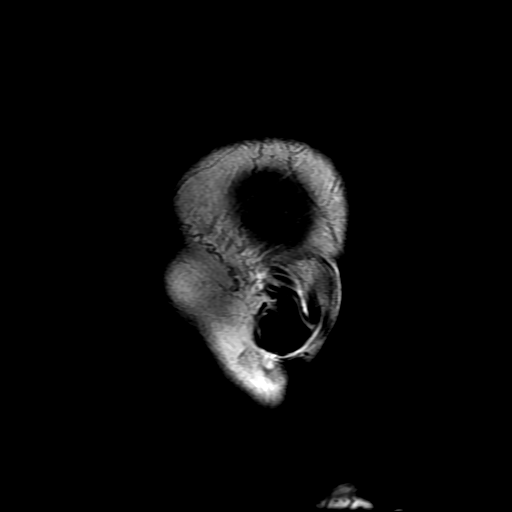

[Series 6: T2 · axial · 5.0mm · 0.43mm/px · z∈[-82,+55]mm · 2 of 24 slices shown (1 of 2)]
[im 1/24]
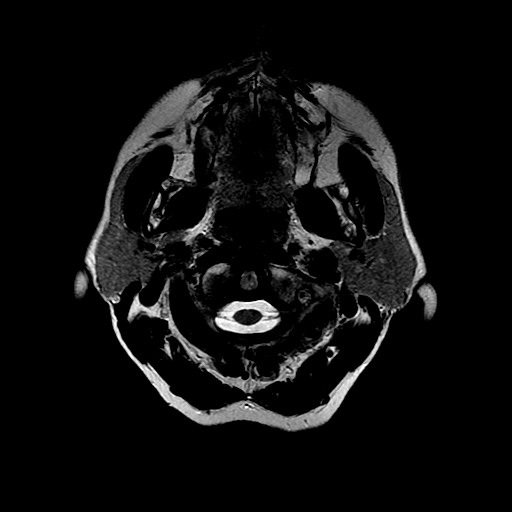
[im 24/24]
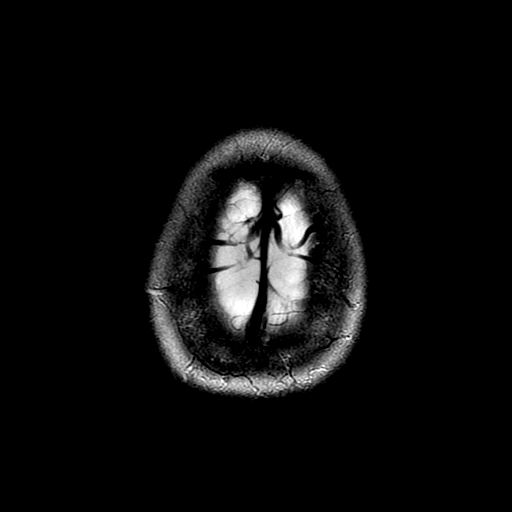

[Series 7: FLAIR · axial · 3.0mm · 0.43mm/px · z∈[-82,+55]mm · 2 of 24 slices shown]
[im 1/24]
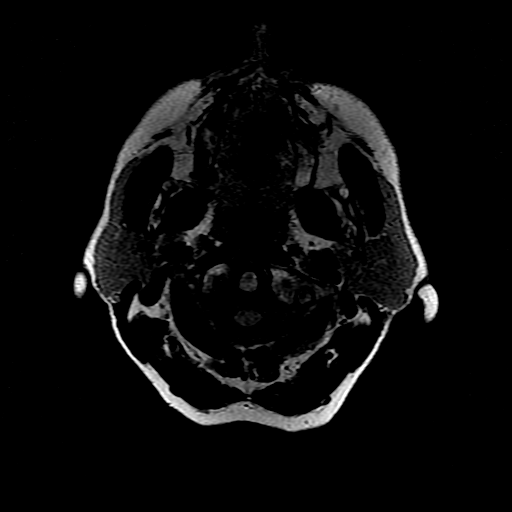
[im 24/24]
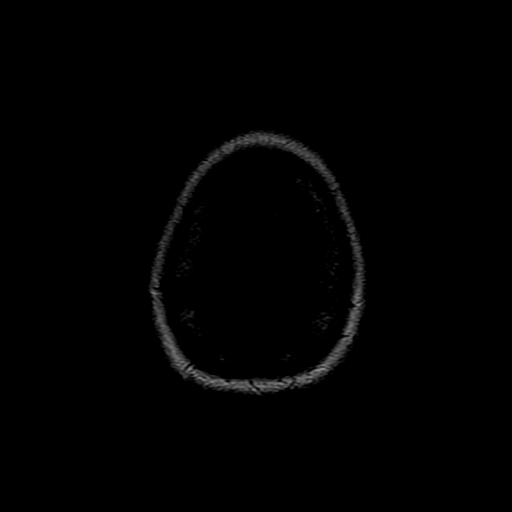

[Series 9: T1 · axial · 3.0mm · 0.43mm/px · z∈[-87,-37]mm · 3 of 100 slices shown (2 of 2)]
[im 1/100]
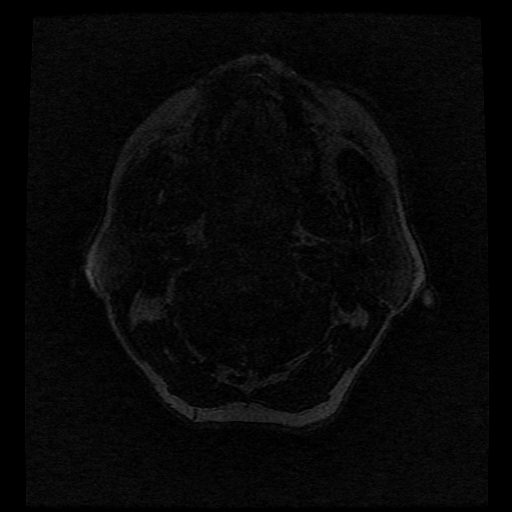
[im 12/100]
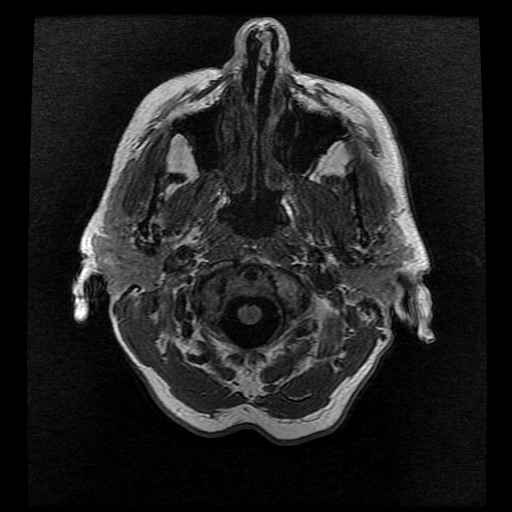
[im 34/100]
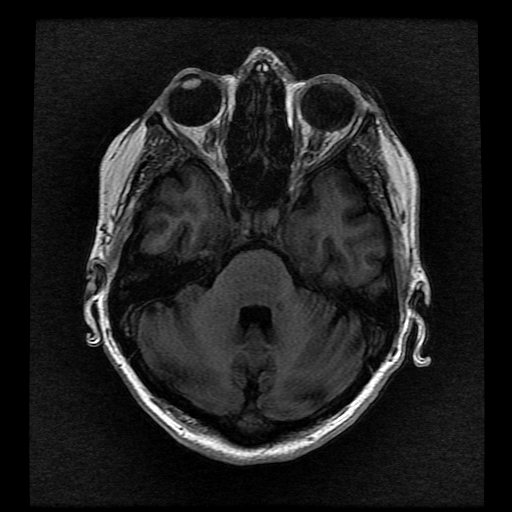

[Series 11: T2 · coronal · 5.0mm · 0.39mm/px · 2 of 25 slices shown (2 of 2)]
[im 1/25]
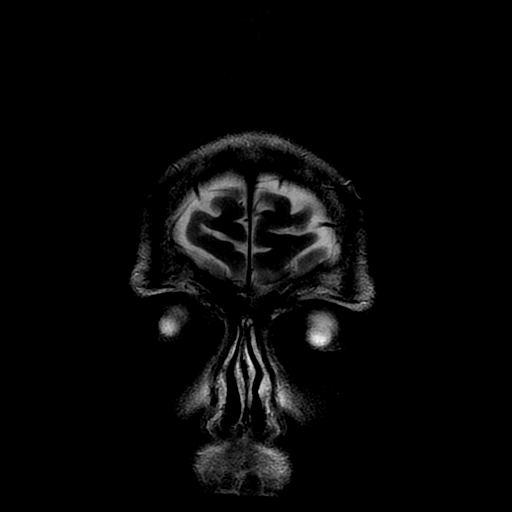
[im 25/25]
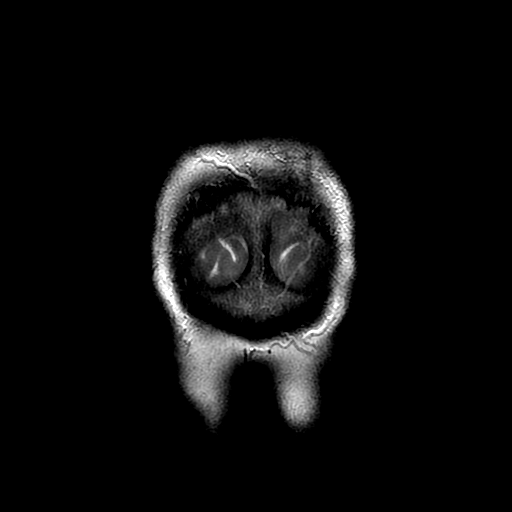

[Series 300: DWI · axial · 3.0mm · 1.09mm/px · z∈[-79,+68]mm · 5 of 50 slices shown (3 of 4)]
[im 1/50]
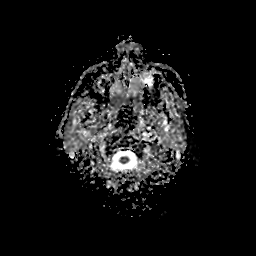
[im 13/50]
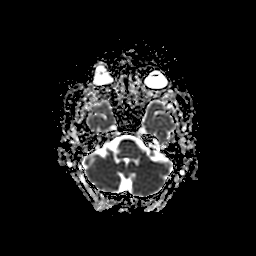
[im 25/50]
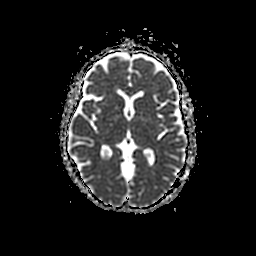
[im 37/50]
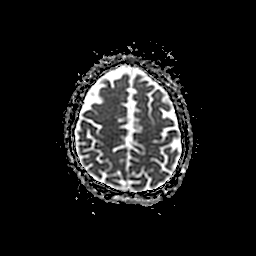
[im 50/50]
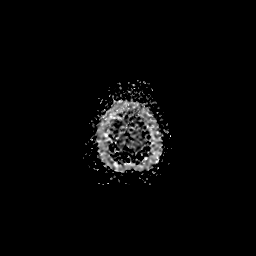

[Series 400: DWI · coronal · 5.0mm · 1.09mm/px · 4 of 38 slices shown (4 of 4)]
[im 1/38]
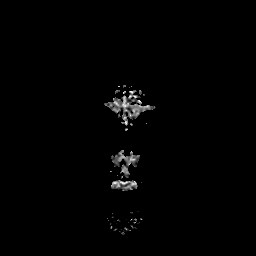
[im 13/38]
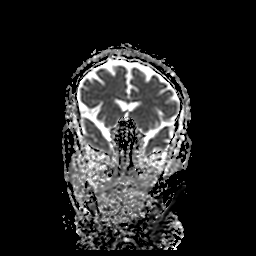
[im 25/38]
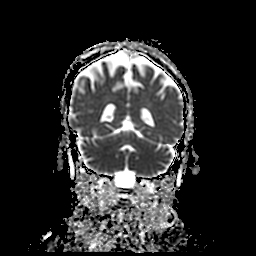
[im 38/38]
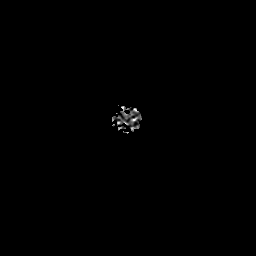

[39 of 48 positions shown; findings below may reference images not displayed]

FINDINGS: Brain: Punctate focus of diffusion hyperintensity along the right
hippocampus. There is no evidence of intracranial hemorrhage. There
is no intracranial mass, mass effect, or edema. There is no
hydrocephalus or extra-axial fluid collection. Ventricles and sulci
are within normal limits in size and configuration. Few scattered
small foci of T2 hyperintensity in the supratentorial white matter
are nonspecific but may reflect minor chronic microvascular ischemic
changes.

Vascular: Major vessel flow voids at the skull base are preserved.

Skull and upper cervical spine: Normal marrow signal is preserved.
Partially imaged anterior cervical spine fusion.

Sinuses/Orbits: Paranasal sinuses are aerated. Orbits are
unremarkable.

Other: Sella is unremarkable. Mild patchy right mastoid fluid
opacification.
IMPRESSION: Punctate acute or subacute right hippocampus infarct.

## 2021-01-13 MED ORDER — CLOPIDOGREL BISULFATE 75 MG PO TABS
75.0000 mg | ORAL_TABLET | Freq: Every day | ORAL | 1 refills | Status: DC
Start: 2021-01-13 — End: 2021-03-14

## 2021-01-13 MED ORDER — ATORVASTATIN CALCIUM 40 MG PO TABS
40.0000 mg | ORAL_TABLET | Freq: Every day | ORAL | 0 refills | Status: DC
Start: 2021-01-13 — End: 2021-01-21

## 2021-01-13 MED ORDER — ATORVASTATIN CALCIUM 80 MG PO TABS: 80.0000 mg | ORAL_TABLET | Freq: Every day | ORAL | Status: DC

## 2021-01-13 MED ORDER — ASPIRIN EC 81 MG PO TBEC: 81.0000 mg | DELAYED_RELEASE_TABLET | Freq: Every day | ORAL | 3 refills | Status: AC

## 2021-01-13 MED ORDER — ATORVASTATIN CALCIUM 40 MG PO TABS
40.0000 mg | ORAL_TABLET | Freq: Every day | ORAL | Status: DC
  Administered 2021-01-13 – 2021-01-14 (×2): 40 mg via ORAL
  Filled 2021-01-13 (×2): qty 1

## 2021-01-13 MED ORDER — CLOPIDOGREL BISULFATE 75 MG PO TABS
75.0000 mg | ORAL_TABLET | Freq: Every day | ORAL | Status: DC
  Administered 2021-01-13 – 2021-01-14 (×2): 75 mg via ORAL
  Filled 2021-01-13 (×2): qty 1

## 2021-01-13 NOTE — Progress Notes (Signed)
Progress Note    Anita Diaz  IRS:[id number] DOB: [birth year]  DOA: 01/11/2021 PCP: Ardith Dark, MD    Brief Narrative:     Medical records reviewed and are as summarized below:  Anita Diaz is an 65 y.o. female with medical history significant of hypertension, hyperlipidemia, heart failure with reduced ejection fraction, biventricular ICD, chronic pain, depression, bipolar 1 disorder,  who presented to ED with acute onset of confusion yesterday morning.  Assessment/Plan:   Principal Problem:   Acute encephalopathy Active Problems:   Chronic pain   Depression, major, single episode, moderate (HCC)   Hyperlipidemia   Hypertension   Heart failure with reduced ejection fraction (HCC)   Biventricular ICD (implantable cardioverter-defibrillator) in place   Cerebral thrombosis with cerebral infarction   Acute encephalopathy due to ? CVA -resolved -MRI + -needs echo, EEG -neurology consulted -await formal interrogation of pacemaker- verbal reports from RN were no a fib but later ? 5 sec episode of flutter in May and no results from last 1 week.   Asa-plavix unless fib seen     Hyperlipidemia -She is not on no statin. ASCVD risk is 6.5%.  -LDL > 70- add statin     Heart failure with reduced ejection fraction (HCC) -She has no signs or symptoms of CHF exacerbation and is euvolemic on exam. -Echo in January 2022 showed an EF of 45 to 50% with mildly decreased left ventricular function grade 1 diastolic dysfunction. -Continue her carvedilol and her Entresto -Monitor intake and output and daily weights.     Biventricular ICD (implantable cardioverter-defibrillator) in place -Followed outpatient by Dr. Lalla Brothers -await interrogation     Depression, major, single episode, moderate (HCC)  -On multiple medications for her depression and unsure if taking as prescribed as discussed above. -We will continue her Prozac, and Latuda.  She states she has not been taking her  Wellbutrin so will not continue however per office notes it seems that she is supposed to be on this. -Holding Abilify as she takes this on an as-needed basis and is already on Jordan.     Family Communication/Anticipated D/C date and plan/Code Status   DVT prophylaxis: Lovenox ordered. Code Status: Full Code.    Dispo: The patient is from: Home              Anticipated d/c is to: Home              Patient currently is not medically stable to d/c.- echo./EEG   Difficult to place patient No         Medical Consultants:   Neurology  Subjective:  MRI +  Objective:    Vitals:   01/13/21 0400 01/13/21 0755 01/13/21 0757 01/13/21 1132  BP: 99/63 132/78 132/78 105/71  Pulse: 64 (!) 59 (!) 53 64  Resp: 19 19 16 16   Temp: 97.6 F (36.4 C) 97.9 F (36.6 C) 97.9 F (36.6 C) 97.6 F (36.4 C)  TempSrc: Oral Oral Oral Oral  SpO2: 93% 96% 94% 95%  Weight:      Height:        Intake/Output Summary (Last 24 hours) at 01/13/2021 1753 Last data filed at 01/13/2021 0441 Gross per 24 hour  Intake 240 ml  Output 400 ml  Net -160 ml   Filed Weights   01/11/21 1000  Weight: 62.6 kg     Data Reviewed:   I have personally reviewed following labs and imaging studies:  Labs: Labs show  the following:   Basic Metabolic Panel: Recent Labs  Lab 01/10/21 2006  NA 134*  K 3.8  CL 99  CO2 24  GLUCOSE 91  BUN 25*  CREATININE 0.74  CALCIUM 9.0   GFR Estimated Creatinine Clearance: 61.8 mL/min (by C-G formula based on SCr of 0.74 mg/dL). Liver Function Tests: Recent Labs  Lab 01/10/21 2006  AST 18  ALT 15  ALKPHOS 72  BILITOT 0.3  PROT 7.1  ALBUMIN 4.3   No results for input(s): LIPASE, AMYLASE in the last 168 hours. Recent Labs  Lab 01/11/21 0223  AMMONIA 47*   Coagulation profile No results for input(s): INR, PROTIME in the last 168 hours.  CBC: Recent Labs  Lab 01/10/21 2006  WBC 9.1  HGB 12.0  HCT 36.1  MCV 86.4  PLT 331   Cardiac  Enzymes: No results for input(s): CKTOTAL, CKMB, CKMBINDEX, TROPONINI in the last 168 hours. BNP (last 3 results) No results for input(s): PROBNP in the last 8760 hours. CBG: Recent Labs  Lab 01/10/21 1957  GLUCAP 94   D-Dimer: No results for input(s): DDIMER in the last 72 hours. Hgb A1c: Recent Labs    01/12/21 0219  HGBA1C 5.5   Lipid Profile: Recent Labs    01/12/21 0219  CHOL 209*  HDL 53  LDLCALC 123*  TRIG 163*  CHOLHDL 3.9   Thyroid function studies: Recent Labs    01/11/21 1930  TSH 1.547   Anemia work up: No results for input(s): VITAMINB12, FOLATE, FERRITIN, TIBC, IRON, RETICCTPCT in the last 72 hours. Sepsis Labs: Recent Labs  Lab 01/10/21 2006  WBC 9.1    Microbiology Recent Results (from the past 240 hour(s))  Resp Panel by RT-PCR (Flu A&B, Covid) Nasopharyngeal Swab     Status: None   Collection Time: 01/11/21  2:23 AM   Specimen: Nasopharyngeal Swab; Nasopharyngeal(NP) swabs in vial transport medium  Result Value Ref Range Status   SARS Coronavirus 2 by RT PCR NEGATIVE NEGATIVE Final    Comment: (NOTE) SARS-CoV-2 target nucleic acids are NOT DETECTED.  The SARS-CoV-2 RNA is generally detectable in upper respiratory specimens during the acute phase of infection. The lowest concentration of SARS-CoV-2 viral copies this assay can detect is 138 copies/mL. A negative result does not preclude SARS-Cov-2 infection and should not be used as the sole basis for treatment or other patient management decisions. A negative result may occur with  improper specimen collection/handling, submission of specimen other than nasopharyngeal swab, presence of viral mutation(s) within the areas targeted by this assay, and inadequate number of viral copies(<138 copies/mL). A negative result must be combined with clinical observations, patient history, and epidemiological information. The expected result is Negative.  Fact Sheet for Patients:   BloggerCourse.com  Fact Sheet for Healthcare Providers:  SeriousBroker.it  This test is no t yet approved or cleared by the [place] FDA and  has been authorized for detection and/or diagnosis of SARS-CoV-2 by FDA under an Emergency Use Authorization (EUA). This EUA will remain  in effect (meaning this test can be used) for the duration of the COVID-19 declaration under Section 564(b)(1) of the Act, 21 U.S.C.section 360bbb-3(b)(1), unless the authorization is terminated  or revoked sooner.       Influenza A by PCR NEGATIVE NEGATIVE Final   Influenza B by PCR NEGATIVE NEGATIVE Final    Comment: (NOTE) The Xpert Xpress SARS-CoV-2/FLU/RSV plus assay is intended as an aid in the diagnosis of influenza from Nasopharyngeal swab specimens and  should not be used as a sole basis for treatment. Nasal washings and aspirates are unacceptable for Xpert Xpress SARS-CoV-2/FLU/RSV testing.  Fact Sheet for Patients: BloggerCourse.com  Fact Sheet for Healthcare Providers: SeriousBroker.it  This test is not yet approved or cleared by the [place] FDA and has been authorized for detection and/or diagnosis of SARS-CoV-2 by FDA under an Emergency Use Authorization (EUA). This EUA will remain in effect (meaning this test can be used) for the duration of the COVID-19 declaration under Section 564(b)(1) of the Act, 21 U.S.C. section 360bbb-3(b)(1), unless the authorization is terminated or revoked.  Performed at [facility], [address] [place], [place] [address]     Procedures and diagnostic studies:  CT ANGIO HEAD NECK W WO CM  Result Date: 01/11/2021 CLINICAL DATA:  Initial evaluation for stroke/TIA, assess intracranial arteries. EXAM: CT ANGIOGRAPHY HEAD AND NECK TECHNIQUE: Multidetector CT imaging of the head and neck was performed using the standard  protocol during bolus administration of intravenous contrast. Multiplanar CT image reconstructions and MIPs were obtained to evaluate the vascular anatomy. Carotid stenosis measurements (when applicable) are obtained utilizing NASCET criteria, using the distal internal carotid diameter as the denominator. CONTRAST:  61mL OMNIPAQUE IOHEXOL 350 MG/ML SOLN COMPARISON:  CT from earlier the same day. FINDINGS: CTA NECK FINDINGS Aortic arch: Visualized aortic arch normal in caliber with normal branch pattern. Mild atheromatous change about the aortic arch and origin of great vessels without significant stenosis. Right carotid system: Right CCA patent from its origin to the bifurcation without stenosis. Mixed plaque about the right carotid bulb/proximal right ICA without significant stenosis. Right ICA mildly tortuous and irregular but patent distally without stenosis, dissection or occlusion. Left carotid system: Left CCA patent from its origin to the bifurcation without stenosis. Mixed plaque about the left carotid bulb/proximal left ICA without significant stenosis. Left ICA mildly tortuous and irregular distally but is widely patent without stenosis, dissection or occlusion. Vertebral arteries: Both vertebral arteries arise from the subclavian arteries. No proximal subclavian artery stenosis. Vertebral arteries are diminutive, and not well assessed proximally due to adjacent venous contamination. Visualized portions of the vertebral arteries patent without visible stenosis or other acute vascular abnormality. Skeleton: No visible acute osseous finding. No discrete or worrisome osseous lesions. Prior fusion at C3 through C6. Other neck: No other visible acute abnormality within the neck. No mass or adenopathy. Few small left thyroid nodules measuring up to 8 mm noted, of doubtful significance given size and patient age, no follow-up imaging recommended (ref: J Am Coll Radiol. 2015 Feb;12(2): 143-50). Upper chest:  Visualized upper chest demonstrates no acute finding. Review of the MIP images confirms the above findings CTA HEAD FINDINGS Anterior circulation: Petrous segments patent bilaterally. Scattered atheromatous change throughout the carotid siphons bilaterally. Short-segment moderate stenosis noted at the cavernous left ICA (series 7, image 196). A1 segments patent bilaterally. Left A1 hypoplastic. Normal anterior communicating artery complex. Anterior cerebral arteries patent to their distal aspects without stenosis. No M1 stenosis or occlusion. Left M1 bifurcates early. Distal MCA branches well perfused and symmetric. Posterior circulation: Vertebral arteries diminutive but patent to the vertebrobasilar junction. Both PICA origins patent and normal. Proximal basilar artery markedly diminutive but patent. Persistent right-sided trigeminal artery noted. Superior cerebellar arteries patent bilaterally. Fetal type origin of both PCAs, both of which are widely patent to their distal aspects. Venous sinuses: Grossly patent allowing for timing the contrast bolus. Anatomic variants: Persistent right trigeminal artery. Fetal type origin of the PCAs.  Hypoplastic left A1 segment. No aneurysm. Review of the MIP images confirms the above findings IMPRESSION: 1. Negative CTA for large vessel occlusion. 2. 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 Additional mild atheromatous change about the aortic arch and carotid bifurcations, with no other hemodynamically significant or correctable stenosis. 3. 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 Associated diminutive vertebrobasilar system. Electronically Signed   By: Rise Mu M.D.   On: 01/11/2021 22:18   MR BRAIN WO CONTRAST  Result Date: 01/13/2021 CLINICAL DATA:  Mental status change, persistent or worsening EXAM: MRI HEAD WITHOUT CONTRAST TECHNIQUE: Multiplanar, multiecho pulse sequences of the brain and  surrounding structures were obtained without intravenous contrast. COMPARISON:  08/09/2020 FINDINGS: Brain: Punctate focus of diffusion hyperintensity along the right hippocampus. There is no evidence of intracranial hemorrhage. There is no intracranial mass, mass effect, or edema. There is no hydrocephalus or extra-axial fluid collection. Ventricles and sulci are within normal limits in size and configuration. Few scattered small foci of T2 hyperintensity in the supratentorial white matter are nonspecific but may reflect minor chronic microvascular ischemic changes. Vascular: Major vessel flow voids at the skull base are preserved. Skull and upper cervical spine: Normal marrow signal is preserved. Partially imaged anterior cervical spine fusion. Sinuses/Orbits: Paranasal sinuses are aerated. Orbits are unremarkable. Other: Sella is unremarkable. Mild patchy right mastoid fluid opacification. 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 Electronically Signed   By: Guadlupe Spanish M.D.   On: 01/13/2021 14:01    Medications:    aspirin EC  81 mg Oral Daily   atorvastatin  40 mg Oral Daily   carvedilol  12.5 mg Oral BID WC   clopidogrel  75 mg Oral Daily   enoxaparin (LOVENOX) injection  40 mg Subcutaneous Q24H   FLUoxetine  40 mg Oral Daily   furosemide  40 mg Oral Daily   loperamide  4 mg Oral Daily   loratadine  10 mg Oral q1800   lurasidone  60 mg Oral Daily   sacubitril-valsartan  1 tablet Oral BID   Continuous Infusions:   LOS: 0 days   Joseph Art  [facility]   How to contact the [facility] Attending or Consulting provider 7A - 7P or covering provider during after hours 7P -7A, for this patient?  Check the care team in [facility] and look for a) attending/consulting [facility] provider listed and b) the [facility] team listed Log into [email or web address] and use [place]'s universal password to access. If you do not have the password, please contact the hospital operator. Locate the  [facility] provider you are looking for under [facility] and page to a number that you can be directly reached. If you still have difficulty reaching the provider, please page the [facility] (Director on Call) for the Hospitalists listed on amion for assistance.  01/13/2021, 5:53 PM

## 2021-01-13 NOTE — Progress Notes (Addendum)
STROKE TEAM PROGRESS NOTE   ATTENDING NOTE: I reviewed above note and agree with the assessment and plan. Pt was seen and examined.   65 year old female with history of hypertension, hyperlipidemia, heart failure status post pacemaker admitted for episode of memory loss.  CT head no acute abnormality.  CTA head and neck for segment moderate stenosis at cavernous left ICA.  MRI showed punctate right hippocampus subacute infarct.  2D echo pending, EEG pending.  Pacemaker interrogation showed possible 5 seconds of a flutter.  LDL 123, A1c 5.5.  On exam, patient neuro intact, however, she still cannot remember what happened 2 days ago recent event.  Etiology for patient stroke likely due to small vessel disease, however given the 5 seconds of a flutter, recommend cardiology EP to confirm and management.  We will follow result of 2D echo and EEG.  Recommend aspirin 81 and Plavix 75 DAPT for 3 weeks and then Plavix alone.  Continue Lipitor 40.  For detailed assessment and plan, please refer to above as I have made changes wherever appropriate.   Marvel Plan, MD PhD Stroke Neurology 01/13/2021 9:23 PM    INTERVAL HISTORY 65 year old white female with history of heart failure with ICD, who developed acute confusion, short term memory loss. Seen by teleneurology and thought to be TGA  but MRI head done shows punctate right hippocampus infarct.   Patient had ICD interrogation done this am, which showed no atrial fibrillation  until 9/15, and no available reports from 9/15 till today. There are reports of 5 seconds of atrial flutter on Sep 15, 2020  No one is at the bedside at time of this exam, but patient called son, Anita Diaz, during the entirety of my exam.   Vitals:   01/13/21 0400 01/13/21 0755 01/13/21 0757 01/13/21 1132  BP: 99/63 132/78 132/78 105/71  Pulse: 64 (!) 59 (!) 53 64  Resp: 19 19 16 16   Temp: 97.6 F (36.4 C) 97.9 F (36.6 C) 97.9 F (36.6 C) 97.6 F (36.4 C)  TempSrc: Oral  Oral Oral Oral  SpO2: 93% 96% 94% 95%  Weight:      Height:       CBC:  Recent Labs  Lab 01/10/21 2006  WBC 9.1  HGB 12.0  HCT 36.1  MCV 86.4  PLT 331   Basic Metabolic Panel:  Recent Labs  Lab 01/10/21 2006  NA 134*  K 3.8  CL 99  CO2 24  GLUCOSE 91  BUN 25*  CREATININE 0.74  CALCIUM 9.0    Lipid Panel:  Recent Labs  Lab 01/12/21 0219  CHOL 209*  TRIG 163*  HDL 53  CHOLHDL 3.9  VLDL 33  LDLCALC 01/14/21*    HgbA1c:  Recent Labs  Lab 01/12/21 0219  HGBA1C 5.5   Urine Drug Screen:  Recent Labs  Lab 01/10/21 2006  LABOPIA NONE DETECTED  COCAINSCRNUR NONE DETECTED  LABBENZ NONE DETECTED  AMPHETMU NONE DETECTED  THCU NONE DETECTED  LABBARB NONE DETECTED    Alcohol Level  Recent Labs  Lab 01/11/21 0223  ETH <10    IMAGING past 24 hours MR BRAIN WO CONTRAST  Result Date: 01/13/2021 CLINICAL DATA:  Mental status change, persistent or worsening EXAM: MRI HEAD WITHOUT CONTRAST TECHNIQUE: Multiplanar, multiecho pulse sequences of the brain and surrounding structures were obtained without intravenous contrast. COMPARISON:  08/09/2020 FINDINGS: Brain: Punctate focus of diffusion hyperintensity along the right hippocampus. There is no evidence of intracranial hemorrhage. There is no intracranial mass,  mass effect, or edema. There is no hydrocephalus or extra-axial fluid collection. Ventricles and sulci are within normal limits in size and configuration. Few scattered small foci of T2 hyperintensity in the supratentorial white matter are nonspecific but may reflect minor chronic microvascular ischemic changes. Vascular: Major vessel flow voids at the skull base are preserved. Skull and upper cervical spine: Normal marrow signal is preserved. Partially imaged anterior cervical spine fusion. Sinuses/Orbits: Paranasal sinuses are aerated. Orbits are unremarkable. Other: Sella is unremarkable. Mild patchy right mastoid fluid opacification. IMPRESSION: Punctate acute or  subacute right hippocampus infarct. Electronically Signed   By: Guadlupe Spanish M.D.   On: 01/13/2021 14:01    PHYSICAL EXAM  Patient is awake, alert, oriented to person, place, month, year, and situation. Patient is able to give a clear and coherent history. Speech  fluent, intact comprehension and repetition. No signs of aphasia or neglect. Visual Fields are full. Pupils are equal, round, and reactive to light. EOMI without ptosis or diploplia.  Facial sensation is symmetric to temperature Facial movement is symmetric.  Hearing is intact to voice. Uvula midline and palate elevates symmetrically. Shoulder shrug is symmetric. Tongue is midline without atrophy or fasciculations.  Tone is normal. Bulk is normal. 5/5 strength was present in all four extremities. Sensation is symmetric to light touch and temperature in the arms and legs. Deep Tendon Reflexes: 2+ and symmetric in the biceps and patellae. Toes are downgoing bilaterally. FNF and HKS are intact bilaterally. Gait - Deferred  ASSESSMENT/PLAN [name] is a 65 y.o. female with history of atrial fibrillation s/p ICD (no AC) with episode of anterograde amnesia during which time she had inability to form new memories with associated "broken-record" phenomenon. Patient reports that she has regained much of the memory that she lost. 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   Stroke:  punctate right hippocampus infarct   Code Stroke  CT head No acute abnormality.  Small vessel disease. Atrophy.  ASPECTS 10.     CTA head & neck  1. Negative CTA for large vessel occlusion. 2. 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 Additional mild atheromatous change about the aortic arch and carotid bifurcations, with no other hemodynamically significant or correctable stenosis. 3. 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 Associated diminutive  vertebrobasilar system.  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 2D Echo pending EEG pending LDL 123 HgbA1c 5.5 VTE prophylaxis - scd No antithrombotic prior to admission, now on aspirin 81 mg daily, clopidogrel 75 mg daily, and   . Aspirin and plavix for 3 weeks then plavix alone. Follow up with [facility] as outpatient.  Therapy recommendations:    Disposition:  home  Hypertension Home meds:  coreg, entresto,  Stable Permissive hypertension (OK if < 220/120) but gradually normalize in 5-7 days Long-term BP goal normotensive  Hyperlipidemia Home meds:  none, lipitor started in hospital LDL 123, goal < 70 Add lipitor Continue statin at discharge  HgbA1c 5.5, goal < 7.0 CBGs Recent Labs    01/10/21 1957  GLUCAP 94    SSI  Hospital day # 0     To contact Stroke Continuity provider, please refer to [email or web address]. After hours, contact General Neurology

## 2021-01-13 NOTE — Progress Notes (Signed)
EEG complete - results pending 

## 2021-01-13 NOTE — Progress Notes (Signed)
Await MRI for d/c JV

## 2021-01-13 NOTE — Progress Notes (Signed)
My colleague was covering [employer]master inbox today and received request for device interrogation from [employer] PA-C. I spoke with Jerilynn Birkenhead with [employer] who reports he spoke with someone in the patient's care team earlier to notify that the patient did not have any afib prior to 9/15, and is in NSR today, but does not currently have access to the data in between without formal interrogation. The patient had very brief episode of atrial noise (possible atrial tach - no fib/flutter) back in May, but nothing since that time. Joey will return for formal interrogation for that interim timeframe prior to admission and will notify Dr. Benjamine Mola of the result. Please call cardiology if further input needed.  Rayyan Orsborn PA-C

## 2021-01-13 NOTE — Progress Notes (Signed)
Patient down from 5 west for MRI brain wo contrast. Patient has [employer] device. [facility] with joey-rep. Orders for DOO 80. SWOT RN gabe to monitor patient.

## 2021-01-13 NOTE — Procedures (Signed)
Routine EEG Report  Anita Diaz is a 65 y.o. female with a history of memory loss who is undergoing an EEG to evaluate for seizures.  Report: This EEG was acquired with electrodes placed according to the International 10-20 electrode system (including Fp1, Fp2, F3, F4, C3, C4, P3, P4, O1, O2, T3, T4, T5, T6, A1, A2, Fz, Cz, Pz). The following electrodes were missing or displaced: none.  The occipital dominant rhythm was 10 Hz. This activity is reactive to stimulation. Drowsiness was manifested by background fragmentation; deeper stages of sleep were not identified. There was no focal slowing. There were no interictal epileptiform discharges. There were no electrographic seizures identified. Photic stimulation and hyperventilation were not performed.  Impression: This EEG was obtained while awake and drowsy and is normal.    Clinical Correlation: Normal EEGs, however, do not rule out epilepsy.  Bing Neighbors, MD [facility] [phone]  If 7pm- 7am, please page neurology on call as listed in AMION.

## 2021-01-14 ENCOUNTER — Observation Stay (HOSPITAL_BASED_OUTPATIENT_CLINIC_OR_DEPARTMENT_OTHER): Payer: Medicare Other

## 2021-01-14 DIAGNOSIS — I6389 Other cerebral infarction: Secondary | ICD-10-CM

## 2021-01-14 DIAGNOSIS — I6330 Cerebral infarction due to thrombosis of unspecified cerebral artery: Secondary | ICD-10-CM | POA: Diagnosis not present

## 2021-01-14 DIAGNOSIS — G9340 Encephalopathy, unspecified: Secondary | ICD-10-CM | POA: Diagnosis not present

## 2021-01-14 DIAGNOSIS — Z95810 Presence of automatic (implantable) cardiac defibrillator: Secondary | ICD-10-CM | POA: Diagnosis not present

## 2021-01-14 LAB — ECHOCARDIOGRAM COMPLETE
Area-P 1/2: 2.32 cm2
Calc EF: 42 %
Height: 62 in
S' Lateral: 3.5 cm
Single Plane A2C EF: 44.4 %
Single Plane A4C EF: 54 %
Weight: 2208.13 oz

## 2021-01-14 NOTE — Progress Notes (Addendum)
STROKE TEAM PROGRESS NOTE   ATTENDING NOTE: I reviewed above note and agree with the assessment and plan.   No acute event overnight, no neuro changes.  Pacemaker interrogation strip reviewed with cardiology and they will consider not a flutter but atrial noise.  EEG normal, 2D echo EF 45 to 50%, improved from prior.  Patient eager to go home.  Recommend aspirin and Plavix DAPT for 3 weeks and then Plavix alone.  Continue Lipitor.  Follow-up with Dr. Lucia Gaskins at [facility] in 4 weeks.  For detailed assessment and plan, please refer to above as I have made changes wherever appropriate.   Marvel Plan, MD PhD Stroke Neurology 01/14/2021 6:09 PM     INTERVAL HISTORY 65 year old white female with history of heart failure with ICD, who developed acute confusion, short term memory loss. Seen by teleneurology and thought to be TGA  but MRI head done shows punctate right hippocampus infarct.   Patient had ICD interrogation done this am, which showed no atrial fibrillation  until 9/15, and no available reports from 9/15 till today. There are reports of 5 seconds of atrial flutter on Sep 15, 2020- further interrogation found it to be atrial noise and not flutter.  Echocardiogram is unremarkable.   No one is at the bedside at time of this exam   Vitals:   01/13/21 1132 01/13/21 2330 01/14/21 0525 01/14/21 0744  BP: 105/71 (!) 98/57 140/74 125/86  Pulse: 64 60 (!) 52 64  Resp: 16 18 18 18   Temp: 97.6 F (36.4 C) (!) 97.4 F (36.3 C) (!) 97.3 F (36.3 C) 97.7 F (36.5 C)  TempSrc: Oral Oral Oral Axillary  SpO2: 95% 94% 96% 93%  Weight:      Height:       CBC:  Recent Labs  Lab 01/10/21 2006  WBC 9.1  HGB 12.0  HCT 36.1  MCV 86.4  PLT 331   Basic Metabolic Panel:  Recent Labs  Lab 01/10/21 2006  NA 134*  K 3.8  CL 99  CO2 24  GLUCOSE 91  BUN 25*  CREATININE 0.74  CALCIUM 9.0    Lipid Panel:  Recent Labs  Lab 01/12/21 0219  CHOL 209*  TRIG 163*  HDL 53  CHOLHDL 3.9  VLDL 33   LDLCALC 123*    HgbA1c:  Recent Labs  Lab 01/12/21 0219  HGBA1C 5.5   Urine Drug Screen:  Recent Labs  Lab 01/10/21 2006  LABOPIA NONE DETECTED  COCAINSCRNUR NONE DETECTED  LABBENZ NONE DETECTED  AMPHETMU NONE DETECTED  THCU NONE DETECTED  LABBARB NONE DETECTED    Alcohol Level  Recent Labs  Lab 01/11/21 0223  ETH <10    IMAGING past 24 hours MR BRAIN WO CONTRAST  Result Date: 01/13/2021 CLINICAL DATA:  Mental status change, persistent or worsening EXAM: MRI HEAD WITHOUT CONTRAST TECHNIQUE: Multiplanar, multiecho pulse sequences of the brain and surrounding structures were obtained without intravenous contrast. COMPARISON:  08/09/2020 FINDINGS: Brain: Punctate focus of diffusion hyperintensity along the right hippocampus. There is no evidence of intracranial hemorrhage. There is no intracranial mass, mass effect, or edema. There is no hydrocephalus or extra-axial fluid collection. Ventricles and sulci are within normal limits in size and configuration. Few scattered small foci of T2 hyperintensity in the supratentorial white matter are nonspecific but may reflect minor chronic microvascular ischemic changes. Vascular: Major vessel flow voids at the skull base are preserved. Skull and upper cervical spine: Normal marrow signal is preserved. Partially imaged  anterior cervical spine fusion. Sinuses/Orbits: Paranasal sinuses are aerated. Orbits are unremarkable. Other: Sella is unremarkable. Mild patchy right mastoid fluid opacification. 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 Electronically Signed   By: Guadlupe Spanish M.D.   On: 01/13/2021 14:01   EEG adult  Result Date: 01/13/2021 Jefferson Fuel, MD     01/13/2021 11:42 PM Routine EEG Report ALIRA SQUICCIARINI is a 65 y.o. female with a history of memory loss who is undergoing an EEG to evaluate for seizures. Report: This EEG was acquired with electrodes placed according to the International 10-20 electrode  system (including Fp1, Fp2, F3, F4, C3, C4, P3, P4, O1, O2, T3, T4, T5, T6, A1, A2, Fz, Cz, Pz). The following electrodes were missing or displaced: none. The occipital dominant rhythm was 10 Hz. This activity is reactive to stimulation. Drowsiness was manifested by background fragmentation; deeper stages of sleep were not identified. There was no focal slowing. There were no interictal epileptiform discharges. There were no electrographic seizures identified. Photic stimulation and hyperventilation were not performed. Impression: This EEG was obtained while awake and drowsy and is normal.   Clinical Correlation: Normal EEGs, however, do not rule out epilepsy. Bing Neighbors, MD [facility] [phone] If 7pm- 7am, please page neurology on call as listed in AMION.   ECHOCARDIOGRAM COMPLETE  Result Date: 01/14/2021    ECHOCARDIOGRAM REPORT   Patient Name:   [name] Date of Exam: 01/14/2021 Medical Rec #:  [id number]      Height:       62.0 in Accession #:    [id number]     Weight:       138.0 lb Date of Birth:  [birth year]      BSA:          1.633 m Patient Age:    65 years       BP:           125/86 mmHg Patient Gender: F              HR:           54 bpm. Exam Location:  Inpatient Procedure: 2D Echo, 3D Echo, Cardiac Doppler and Color Doppler Indications:    Stroke  History:        Patient has prior history of Echocardiogram examinations, most                 recent 05/21/2020. CHF, Abnormal ECG and Defibrillator, Stroke;                 Arrythmias:LBBB and SVT.  Sonographer:    Sheralyn Boatman RDCS Referring Phys: [phone] JESSICA U [facility]  Sonographer Comments: Technically difficult study due to poor echo windows. IMPRESSIONS  1. Left ventricular ejection fraction, by estimation, is 45 to 50%. The left ventricle has mildly decreased function. The left ventricle demonstrates regional wall motion abnormalities (see scoring diagram/findings for description). Left ventricular diastolic parameters are  indeterminate. There is hypokinesis of the left ventricular, basal inferior wall.  2. Right ventricular systolic function is normal. The right ventricular size is normal. There is normal pulmonary artery systolic pressure. The estimated right ventricular systolic pressure is 26.6 mmHg.  3. The mitral valve is normal in structure. No evidence of mitral valve regurgitation. No evidence of mitral stenosis.  4. Tricuspid valve regurgitation is mild to moderate.  5. The aortic valve is grossly normal. There is mild calcification of the aortic valve. Aortic valve regurgitation is trivial.  6. The inferior vena cava is normal in size with greater than 50% respiratory variability, suggesting right atrial pressure of 3 mmHg. Comparison(s): No significant change from prior study. Conclusion(s)/Recommendation(s): No intracardiac source of embolism detected on this transthoracic study. A transesophageal echocardiogram is recommended to exclude cardiac source of embolism if clinically indicated. FINDINGS  Left Ventricle: Left ventricular ejection fraction, by estimation, is 45 to 50%. The left ventricle has mildly decreased function. The left ventricle demonstrates regional wall motion abnormalities. The left ventricular internal cavity size was normal in size. There is no left ventricular hypertrophy. Left ventricular diastolic parameters are indeterminate. Right Ventricle: The right ventricular size is normal. No increase in right ventricular wall thickness. Right ventricular systolic function is normal. There is normal pulmonary artery systolic pressure. The tricuspid regurgitant velocity is 2.43 m/s, and  with an assumed right atrial pressure of 3 mmHg, the estimated right ventricular systolic pressure is 26.6 mmHg. Left Atrium: Left atrial size was normal in size. Right Atrium: Right atrial size was normal in size. Pericardium: There is no evidence of pericardial effusion. Presence of pericardial fat pad. Mitral Valve: The  mitral valve is normal in structure. No evidence of mitral valve regurgitation. No evidence of mitral valve stenosis. Tricuspid Valve: The tricuspid valve is normal in structure. Tricuspid valve regurgitation is mild to moderate. No evidence of tricuspid stenosis. Aortic Valve: The aortic valve is grossly normal. There is mild calcification of the aortic valve. Aortic valve regurgitation is trivial. Pulmonic Valve: The pulmonic valve was grossly normal. Pulmonic valve regurgitation is not visualized. No evidence of pulmonic stenosis. Aorta: The aortic root, ascending aorta, aortic arch and descending aorta are all structurally normal, with no evidence of dilitation or obstruction. Venous: The inferior vena cava is normal in size with greater than 50% respiratory variability, suggesting right atrial pressure of 3 mmHg. IAS/Shunts: No atrial level shunt detected by color flow Doppler. Additional Comments: A device lead is visualized.  LEFT VENTRICLE PLAX 2D LVIDd:         4.90 cm     Diastology LVIDs:         3.50 cm     LV e' medial:    5.00 cm/s LV PW:         1.00 cm     LV E/e' medial:  8.3 LV IVS:        0.90 cm     LV e' lateral:   3.50 cm/s LVOT diam:     2.00 cm     LV E/e' lateral: 11.9 LV SV:         63 LV SV Index:   38 LVOT Area:     3.14 cm                             3D Volume EF: LV Volumes (MOD)           3D EF:        50 % LV vol d, MOD A2C: 64.2 ml LV EDV:       112 ml LV vol d, MOD A4C: 67.2 ml LV ESV:       56 ml LV vol s, MOD A2C: 35.7 ml LV SV:        56 ml LV vol s, MOD A4C: 30.9 ml LV SV MOD A2C:     28.5 ml LV SV MOD A4C:     67.2 ml LV SV MOD  BP:      27.0 ml RIGHT VENTRICLE             IVC RV S prime:     13.10 cm/s  IVC diam: 1.00 cm TAPSE (M-mode): 1.9 cm LEFT ATRIUM             Index       RIGHT ATRIUM           Index LA diam:        3.50 cm 2.14 cm/m  RA Area:     12.30 cm LA Vol (A2C):   22.6 ml 13.84 ml/m RA Volume:   26.30 ml  16.11 ml/m LA Vol (A4C):   21.0 ml 12.86 ml/m LA  Biplane Vol: 22.5 ml 13.78 ml/m  AORTIC VALVE LVOT Vmax:   89.50 cm/s LVOT Vmean:  54.400 cm/s LVOT VTI:    0.199 m  AORTA Ao Root diam: 3.40 cm Ao Asc diam:  3.20 cm MITRAL VALVE               TRICUSPID VALVE MV Area (PHT): 2.32 cm    TR Peak grad:   23.6 mmHg MV Decel Time: 327 msec    TR Vmax:        243.00 cm/s MV E velocity: 41.60 cm/s MV A velocity: 82.30 cm/s  SHUNTS MV E/A ratio:  0.51        Systemic VTI:  0.20 m                            Systemic Diam: 2.00 cm Jodelle Red MD Electronically signed by Jodelle Red MD Signature Date/Time: 01/14/2021/10:57:29 AM    Final     PHYSICAL EXAM  Patient is awake, alert, oriented to person, place, month, year, and situation. Patient is able to give a clear and coherent history. Speech  fluent, intact comprehension and repetition. No signs of aphasia or neglect. Visual Fields are full. Pupils are equal, round, and reactive to light. EOMI without ptosis or diploplia.  Facial sensation is symmetric to temperature Facial movement is symmetric.  Hearing is intact to voice. Uvula midline and palate elevates symmetrically. Shoulder shrug is symmetric. Tongue is midline without atrophy or fasciculations.  Tone is normal. Bulk is normal. 5/5 strength was present in all four extremities. Sensation is symmetric to light touch and temperature in the arms and legs. Deep Tendon Reflexes: 2+ and symmetric in the biceps and patellae. Toes are downgoing bilaterally. FNF and HKS are intact bilaterally. Gait - Deferred  ASSESSMENT/PLAN Ms. YOHANNA TOW is a 65 y.o. female with history of atrial fibrillation s/p ICD (no AC) with episode of anterograde amnesia during which time she had inability to form new memories with associated "broken-record" phenomenon. Patient reports that she has regained much of the memory that she lost. 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   Stroke:  punctate right  hippocampus infarct   Code Stroke  CT head No acute abnormality.  Small vessel disease. Atrophy.  ASPECTS 10.     CTA head & neck  1. Negative CTA for large vessel occlusion. 2. 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 Additional mild atheromatous change about the aortic arch and carotid bifurcations, with no other hemodynamically significant or correctable stenosis. 3. 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 Associated diminutive vertebrobasilar system.  MRI  BRAIN:  Punctate acute or  subacute right hippocampus infarct. 2D Echo  LVEF 45-50%, no LVH, normal LA size , no IA shunt.  EEG unremarkable.  LDL 123 HgbA1c 5.5 VTE prophylaxis - scd No antithrombotic prior to admission, now on aspirin 81 mg daily, clopidogrel 75 mg daily, and   . Aspirin and plavix for 3 weeks then plavix alone. Follow up with [facility] as outpatient.  Therapy recommendations:    Disposition:  home today.   Hypertension Home meds:  coreg, entresto,  Stable Permissive hypertension (OK if < 220/120) but gradually normalize in 5-7 days Long-term BP goal normotensive  Hyperlipidemia Home meds:  none, lipitor started in hospital LDL 123, goal < 70 Add lipitor Continue statin at discharge  HgbA1c 5.5, goal < 7.0 CBGs No results for input(s): GLUCAP in the last 72 hours.   SSI  Hospital day # 0     To contact Stroke Continuity provider, please refer to [email or web address]. After hours, contact General Neurology

## 2021-01-14 NOTE — Progress Notes (Signed)
  Echocardiogram 2D Echocardiogram has been performed.  Anita Diaz 01/14/2021, 9:29 AM

## 2021-01-14 NOTE — Discharge Summary (Signed)
Physician Discharge Summary  AIYANNA Diaz WCH:[id number] DOB: 05/10/1955 DOA: 01/11/2021  PCP: Ardith Dark, MD  Admit date: 01/11/2021 Discharge date: 01/14/2021  Admitted From: home Discharge disposition: home   Recommendations for Outpatient Follow-Up:   ASA plus plavix for 21 days then plavix alone Statin added   Discharge Diagnosis:   Principal Problem:   Acute encephalopathy Active Problems:   Chronic pain   Depression, major, single episode, moderate (HCC)   Hyperlipidemia   Hypertension   Heart failure with reduced ejection fraction (HCC)   Biventricular ICD (implantable cardioverter-defibrillator) in place   Cerebral thrombosis with cerebral infarction    Discharge Condition: Improved.  Diet recommendation: Low sodium, heart healthy  Wound care: None.  Code status: Full.   History of Present Illness:   Anita Diaz is a 65 y.o. female with medical history significant of hypertension, hyperlipidemia, heart failure with reduced ejection fraction, biventricular ICD, chronic pain, depression, bipolar 1 disorder,  who presented to ED with acute onset of confusion yesterday morning.  Daughter gives most of history.  She states that yesterday morning her mom forgot to go to the grocery store and then forgot that she had called.  When the daughter got home the mom did not know the date and seemed out of it and confused.  She denied any slurred speech, facial weakness, or gait abnormality.  She was unable to tell her daughter what she ate or drink or did for the day she could not remember taking the dogs to the vet.  She would also say that she had chest pain and then later state she did not have any chest pain.  She appears to be back at her baseline today with no more confusion.  Is able to tell me what happened in the ER but she is not able to tell me what happened during the day yesterday. She denies any drug use.    History of smoking. stopped 15 years  ago with history of stroke in her father.  She is currently on aspirin 81 mg a day.   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 ASCVD risk is 6.5%.  -LDL > 70- added statin     Heart failure with reduced ejection fraction (HCC) -She has no signs or symptoms of CHF exacerbation and is euvolemic on exam. -Echo in January 2022 showed an EF of 45 to 50% with mildly decreased left ventricular function grade 1 diastolic dysfunction. -Continue her carvedilol and her Entresto -Monitor intake and output and daily weights.     Biventricular ICD (implantable cardioverter-defibrillator) in place -Followed outpatient by Dr. Lalla Brothers -interrogation shows: possible atrial tach - no fib/flutter back in May    Depression, major, single episode, moderate (HCC)  -On multiple medications for her depression and unsure if taking as prescribed as discussed above. -We will continue her Prozac, and Latuda.  She states she has not been taking her Wellbutrin so will not continue however per office notes it seems that she  is supposed to be on this. -Holding Abilify as she takes this on an as-needed basis and is already on [place].          Medical Consultants:   Neurology Cards (phone)   Discharge Exam:   Vitals:   01/14/21 0525 01/14/21 0744  BP: 140/74 125/86  Pulse: (!) 52 64  Resp: 18 18  Temp: (!) 97.3 F (36.3 C) 97.7 F (36.5 C)  SpO2: 96% 93%   Vitals:   01/13/21 1132 01/13/21 2330 01/14/21 0525 01/14/21 0744  BP:  105/71 (!) 98/57 140/74 125/86  Pulse: 64 60 (!) 52 64  Resp: 16 18 18 18   Temp: 97.6 F (36.4 C) (!) 97.4 F (36.3 C) (!) 97.3 F (36.3 C) 97.7 F (36.5 C)  TempSrc: Oral Oral Oral Axillary  SpO2: 95% 94% 96% 93%  Weight:      Height:        General exam: Appears calm and comfortable.    The results of significant diagnostics from this hospitalization (including imaging, microbiology, ancillary and laboratory) are listed below for reference.     Procedures and Diagnostic Studies:   CT ANGIO HEAD NECK W WO CM  Result Date: 01/11/2021 CLINICAL DATA:  Initial evaluation for stroke/TIA, assess intracranial arteries. EXAM: CT ANGIOGRAPHY HEAD AND NECK TECHNIQUE: Multidetector CT imaging of the head and neck was performed using the standard protocol during bolus administration of intravenous contrast. Multiplanar CT image reconstructions and MIPs were obtained to evaluate the vascular anatomy. Carotid stenosis measurements (when applicable) are obtained utilizing NASCET criteria, using the distal internal carotid diameter as the denominator. CONTRAST:  54mL OMNIPAQUE IOHEXOL 350 MG/ML SOLN COMPARISON:  CT from earlier the same day. FINDINGS: CTA NECK FINDINGS Aortic arch: Visualized aortic arch normal in caliber with normal branch pattern. Mild atheromatous change about the aortic arch and origin of great vessels without significant stenosis. Right carotid system: Right CCA patent from its origin to the bifurcation without stenosis. Mixed plaque about the right carotid bulb/proximal right ICA without significant stenosis. Right ICA mildly tortuous and irregular but patent distally without stenosis, dissection or occlusion. Left carotid system: Left CCA patent from its origin to the bifurcation without stenosis. Mixed plaque about the left carotid bulb/proximal left ICA without significant stenosis. Left ICA mildly tortuous and irregular distally but is widely patent without stenosis, dissection or  occlusion. Vertebral arteries: Both vertebral arteries arise from the subclavian arteries. No proximal subclavian artery stenosis. Vertebral arteries are diminutive, and not well assessed proximally due to adjacent venous contamination. Visualized portions of the vertebral arteries patent without visible stenosis or other acute vascular abnormality. Skeleton: No visible acute osseous finding. No discrete or worrisome osseous lesions. Prior fusion at C3 through C6. Other neck: No other visible acute abnormality within the neck. No mass or adenopathy. Few small left thyroid nodules measuring up to 8 mm noted, of doubtful significance given size and patient age, no follow-up imaging recommended (ref: J Am Coll Radiol. 2015 Feb;12(2): 143-50). Upper chest: Visualized upper chest demonstrates no acute finding. Review of the MIP images confirms the above findings CTA HEAD FINDINGS Anterior circulation: Petrous segments patent bilaterally. Scattered atheromatous change throughout the carotid siphons bilaterally. Short-segment moderate stenosis noted at the cavernous left ICA (series 7, image 196). A1 segments patent bilaterally. Left A1 hypoplastic. Normal anterior communicating artery complex. Anterior cerebral arteries patent to their distal aspects without stenosis. No M1 stenosis or occlusion. Left M1 bifurcates early. Distal MCA branches well perfused and symmetric.  Posterior circulation: Vertebral arteries diminutive but patent to the vertebrobasilar junction. Both PICA origins patent and normal. Proximal basilar artery markedly diminutive but patent. Persistent right-sided trigeminal artery noted. Superior cerebellar arteries patent bilaterally. Fetal type origin of both PCAs, both of which are widely patent to their distal aspects. Venous sinuses: Grossly patent allowing for timing the contrast bolus. Anatomic variants: Persistent right trigeminal artery. Fetal type origin of the PCAs. Hypoplastic left A1 segment.  No aneurysm. Review of the MIP images confirms the above findings IMPRESSION: 1. Negative CTA for large vessel occlusion. 2. 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 Additional mild atheromatous change about the aortic arch and carotid bifurcations, with no other hemodynamically significant or correctable stenosis. 3. 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 Associated diminutive vertebrobasilar system. Electronically Signed   By: Rise Mu M.D.   On: 01/11/2021 22:18   CT HEAD WO CONTRAST ( )  Result Date: 01/11/2021 CLINICAL DATA:  Mental status change of unknown cause. Altered mental status since earlier today. EXAM: CT HEAD WITHOUT CONTRAST TECHNIQUE: Contiguous axial images were obtained from the base of the skull through the vertex without intravenous contrast. COMPARISON:  MRI brain 08/09/2020 FINDINGS: Brain: No evidence of acute infarction, hemorrhage, hydrocephalus, extra-axial collection or mass lesion/mass effect. Vascular: No hyperdense vessel or unexpected calcification. Skull: Normal. Negative for fracture or focal lesion. Sinuses/Orbits: Paranasal sinuses and mastoid air cells are clear. Other: None. IMPRESSION: No acute intracranial abnormalities. Electronically Signed   By: Burman Nieves M.D.   On: 01/11/2021 02:42     Labs:   Basic Metabolic Panel: Recent Labs  Lab 01/10/21 2006  NA 134*  K 3.8  CL 99  CO2 24  GLUCOSE 91  BUN 25*  CREATININE 0.74  CALCIUM 9.0   GFR Estimated Creatinine Clearance: 61.8 mL/min (by C-G formula based on SCr of 0.74 mg/dL). Liver Function Tests: Recent Labs  Lab 01/10/21 2006  AST 18  ALT 15  ALKPHOS 72  BILITOT 0.3  PROT 7.1  ALBUMIN 4.3   No results for input(s): LIPASE, AMYLASE in the last 168 hours. Recent Labs  Lab 01/11/21 0223  AMMONIA 47*   Coagulation profile No results for input(s): INR, PROTIME in the last 168 hours.  CBC: Recent  Labs  Lab 01/10/21 2006  WBC 9.1  HGB 12.0  HCT 36.1  MCV 86.4  PLT 331   Cardiac Enzymes: No results for input(s): CKTOTAL, CKMB, CKMBINDEX, TROPONINI in the last 168 hours. BNP: Invalid input(s): POCBNP CBG: Recent Labs  Lab 01/10/21 1957  GLUCAP 94   D-Dimer No results for input(s): DDIMER in the last 72 hours. Hgb A1c Recent Labs    01/12/21 0219  HGBA1C 5.5   Lipid Profile Recent Labs    01/12/21 0219  CHOL 209*  HDL 53  LDLCALC 123*  TRIG 163*  CHOLHDL 3.9   Thyroid function studies Recent Labs    01/11/21 1930  TSH 1.547   Anemia work up No results for input(s): VITAMINB12, FOLATE, FERRITIN, TIBC, IRON, RETICCTPCT in the last 72 hours. Microbiology Recent Results (from the past 240 hour(s))  Resp Panel by RT-PCR (Flu A&B, Covid) Nasopharyngeal Swab     Status: None   Collection Time: 01/11/21  2:23 AM   Specimen: Nasopharyngeal Swab; Nasopharyngeal(NP) swabs in vial transport medium  Result Value Ref Range Status   SARS Coronavirus 2 by RT PCR NEGATIVE NEGATIVE Final    Comment: (NOTE) SARS-CoV-2 target nucleic acids  are NOT DETECTED.  The SARS-CoV-2 RNA is generally detectable in upper respiratory specimens during the acute phase of infection. The lowest concentration of SARS-CoV-2 viral copies this assay can detect is 138 copies/mL. A negative result does not preclude SARS-Cov-2 infection and should not be used as the sole basis for treatment or other patient management decisions. A negative result may occur with  improper specimen collection/handling, submission of specimen other than nasopharyngeal swab, presence of viral mutation(s) within the areas targeted by this assay, and inadequate number of viral copies(<138 copies/mL). A negative result must be combined with clinical observations, patient history, and epidemiological information. The expected result is Negative.  Fact Sheet for Patients:   BloggerCourse.com  Fact Sheet for Healthcare Providers:  SeriousBroker.it  This test is no t yet approved or cleared by the [place] FDA and  has been authorized for detection and/or diagnosis of SARS-CoV-2 by FDA under an Emergency Use Authorization (EUA). This EUA will remain  in effect (meaning this test can be used) for the duration of the COVID-19 declaration under Section 564(b)(1) of the Act, 21 U.S.C.section 360bbb-3(b)(1), unless the authorization is terminated  or revoked sooner.       Influenza A by PCR NEGATIVE NEGATIVE Final   Influenza B by PCR NEGATIVE NEGATIVE Final    Comment: (NOTE) The Xpert Xpress SARS-CoV-2/FLU/RSV plus assay is intended as an aid in the diagnosis of influenza from Nasopharyngeal swab specimens and should not be used as a sole basis for treatment. Nasal washings and aspirates are unacceptable for Xpert Xpress SARS-CoV-2/FLU/RSV testing.  Fact Sheet for Patients: BloggerCourse.com  Fact Sheet for Healthcare Providers: SeriousBroker.it  This test is not yet approved or cleared by the [place] FDA and has been authorized for detection and/or diagnosis of SARS-CoV-2 by FDA under an Emergency Use Authorization (EUA). This EUA will remain in effect (meaning this test can be used) for the duration of the COVID-19 declaration under Section 564(b)(1) of the Act, 21 U.S.C. section 360bbb-3(b)(1), unless the authorization is terminated or revoked.  Performed at [facility], [address], [place], [place] [address]      Discharge Instructions:   Discharge Instructions     Ambulatory referral to Neurology   Complete by: As directed    An appointment is requested in approximately: 4 weeks-- with Dr. Lucia Gaskins. She is Dr. Trevor Mace pt. Thanks.   Diet - low sodium heart healthy   Complete by: As directed    Discharge  instructions   Complete by: As directed    ASA plus plavix x 3 weeks then plavix alone Outpatient echo and EEG   Discharge patient   Complete by: As directed    Discharge disposition: 01-Home or Self Care   Discharge patient date: 01/14/2021   Increase activity slowly   Complete by: As directed       Allergies as of 01/14/2021       Reactions   Sulfa Antibiotics Other (See Comments)   Childhood allergy        Medication List     STOP taking these medications    ARIPiprazole 5 MG tablet Commonly known as: ABILIFY   buPROPion 300 MG 24 hr tablet Commonly known as: WELLBUTRIN XL   cyclobenzaprine 10 MG tablet Commonly known as: FLEXERIL       TAKE these medications    aspirin EC 81 MG tablet Take 1 tablet (81 mg total) by mouth daily for 21 days.   atorvastatin 40 MG tablet  Commonly known as: LIPITOR Take 1 tablet (40 mg total) by mouth daily.   carvedilol 12.5 MG tablet Commonly known as: COREG TAKE 1 & 1/2 (ONE & ONE-HALF) TABLETS BY MOUTH TWICE DAILY   clopidogrel 75 MG tablet Commonly known as: PLAVIX Take 1 tablet (75 mg total) by mouth daily.   diazepam 5 MG tablet Commonly known as: VALIUM Take 1 tablet (5 mg total) by mouth every 12 (twelve) hours as needed for anxiety.   Entresto 24-26 MG Generic drug: sacubitril-valsartan Take 1 tablet by mouth 2 (two) times daily.   FLUoxetine 40 MG capsule Commonly known as: PROZAC Take 1 capsule by mouth once daily   furosemide 40 MG tablet Commonly known as: LASIX Take 1 tablet by mouth once daily   Latuda 60 MG Tabs Generic drug: Lurasidone HCl Take 1 tablet (60 mg total) by mouth daily.   levocetirizine 5 MG tablet Commonly known as: XYZAL Take 5 mg by mouth every evening.   loperamide 2 MG tablet Commonly known as: IMODIUM A-D Take 4 mg by mouth daily.   nitroGLYCERIN 0.4 MG SL tablet Commonly known as: NITROSTAT PLACE 1 TABLET UNDER THE TONGUE EVERY 5 MINUTES AS NEEDED FOR CHEST PAIN         Follow-up Information     Ardith Dark, MD Follow up in 1 week(s).   Specialty: Family Medicine Why: outpateint echo Contact information: [address] [place] [address] [phone]         Jake Bathe, MD .   Specialty: Cardiology Contact information: [address] [place] [address] [phone]         Anson Fret, MD. Schedule an appointment as soon as possible for a visit in 1 month(s).   Specialty: Neurology Contact information: [address] [place] [address] [phone]                  Time coordinating discharge: 35 min  Signed:  Joseph Art DO  [facility] 01/14/2021, 11:07 AM

## 2021-01-17 ENCOUNTER — Telehealth: Payer: Self-pay

## 2021-01-17 ENCOUNTER — Institutional Professional Consult (permissible substitution): Payer: Medicare Other | Admitting: Neurology

## 2021-01-17 NOTE — Telephone Encounter (Signed)
Patient is calling in stating she is needing a hospital follow up within the next week per hospital instructions. Please advise where to put patient.

## 2021-01-18 ENCOUNTER — Telehealth: Payer: Self-pay
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
## 2021-01-18 NOTE — Telephone Encounter (Signed)
/  Unscheduled manual transmission received.  5 NSVT @ 150's- 170's . Longest 22 sec/32 beats.  Patient reports she has not felt well and during this time felt palpitations and chest pain. Does not recall activity when chest pain onset began. Reports compliance with medications including Entresto 24-26 mg BID, Lasix 40 mg daily, Coreg 12.5 mg (take 1 and 1/2 tablets) BID.   Has apt. With Edd Fabian  01/21/21.

## 2021-01-20 NOTE — Telephone Encounter (Signed)
Patient is scheduled for the 17th but wants to be seen sooner can she be worked in. g

## 2021-01-20 NOTE — Telephone Encounter (Signed)
Pt called to check on appt. When can we schedule appt? Please Advise.

## 2021-01-21 ENCOUNTER — Ambulatory Visit (HOSPITAL_BASED_OUTPATIENT_CLINIC_OR_DEPARTMENT_OTHER): Payer: Medicare Other | Admitting: General Practice

## 2021-01-21 ENCOUNTER — Encounter (HOSPITAL_BASED_OUTPATIENT_CLINIC_OR_DEPARTMENT_OTHER): Payer: Self-pay | Admitting: General Practice

## 2021-01-21 ENCOUNTER — Other Ambulatory Visit: Payer: Self-pay

## 2021-01-21 VITALS — BP 126/68 | HR 58 | Ht 62.0 in | Wt 145.0 lb

## 2021-01-21 DIAGNOSIS — I428 Other cardiomyopathies: Secondary | ICD-10-CM

## 2021-01-21 DIAGNOSIS — I471 Supraventricular tachycardia: Secondary | ICD-10-CM | POA: Diagnosis not present

## 2021-01-21 DIAGNOSIS — Z95810 Presence of automatic (implantable) cardiac defibrillator: Secondary | ICD-10-CM

## 2021-01-21 DIAGNOSIS — I5022 Chronic systolic (congestive) heart failure: Secondary | ICD-10-CM

## 2021-01-21 DIAGNOSIS — E785 Hyperlipidemia, unspecified: Secondary | ICD-10-CM

## 2021-01-21 DIAGNOSIS — I10 Essential (primary) hypertension: Secondary | ICD-10-CM

## 2021-01-21 MED ORDER — ATORVASTATIN CALCIUM 40 MG PO TABS: 40.0000 mg | ORAL_TABLET | Freq: Every day | ORAL | 3 refills | Status: DC

## 2021-01-21 NOTE — Patient Instructions (Signed)
Medication Instructions:  The current medical regimen is effective;  continue present plan and medications.  *If you need a refill on your cardiac medications before your next appointment, please call your pharmacy*   Follow-Up: At [facility], you and your health needs are our priority.  As part of our continuing mission to provide you with exceptional heart care, we have created designated Provider Care Teams.  These Care Teams include your primary Cardiologist (physician) and Advanced Practice Providers (APPs -  Physician Assistants and Nurse Practitioners) who all work together to provide you with the care you need, when you need it.  We recommend signing up for the patient portal called "MyChart".  Sign up information is provided on this After Visit Summary.  MyChart is used to connect with patients for Virtual Visits (Telemedicine).  Patients are able to view lab/test results, encounter notes, upcoming appointments, etc.  Non-urgent messages can be sent to your provider as well.   To learn more about what you can do with MyChart, go to [email or web address].    Your next appointment:   3 month(s)  The format for your next appointment:   In Person  Provider:   Donato Schultz, MD

## 2021-01-21 NOTE — Telephone Encounter (Signed)
Can pt be worked in sooner, it looks like she is now scheduled for the 14th.

## 2021-01-24 ENCOUNTER — Telehealth: Payer: Self-pay

## 2021-01-24 NOTE — Telephone Encounter (Signed)
Noted  

## 2021-01-24 NOTE — Telephone Encounter (Signed)
Patient is scheduled for the 14th, no available 40 minute slots until then.

## 2021-01-24 NOTE — Telephone Encounter (Signed)
See below

## 2021-01-24 NOTE — Telephone Encounter (Signed)
Ok to schedule in any available 40 minute slot.  Anita Diaz. [name], MD 01/24/2021 7:48 AM

## 2021-01-24 NOTE — Telephone Encounter (Signed)
We can schedule her in any open 40 minute slot.  Anita Diaz. [name], MD 01/24/2021 10:34 AM

## 2021-01-26 NOTE — Telephone Encounter (Signed)
Patient is leaving out of town on 10/11 at 7 am and Is unable to make the apt on 10/14 she had previously scheduled. I offered her 10/11 at 1:00 pm and patient declined so patient states she is ok with waiting till she returns.patient is now scheduled for 11/4

## 2021-01-26 NOTE — Telephone Encounter (Signed)
No change in treatment

## 2021-01-27 ENCOUNTER — Other Ambulatory Visit: Payer: Self-pay | Admitting: Family Medicine

## 2021-01-27 NOTE — Telephone Encounter (Signed)
Noted  

## 2021-02-04 ENCOUNTER — Inpatient Hospital Stay: Payer: Medicare Other | Admitting: Family Medicine

## 2021-02-21 ENCOUNTER — Telehealth: Payer: Self-pay | Admitting: Family Medicine

## 2021-02-21 ENCOUNTER — Other Ambulatory Visit: Payer: Self-pay

## 2021-02-21 MED ORDER — ATORVASTATIN CALCIUM 40 MG PO TABS: 40.0000 mg | ORAL_TABLET | Freq: Every day | ORAL | 3 refills | Status: DC

## 2021-02-21 NOTE — Progress Notes (Signed)
  Care Management   Follow Up Note   02/21/2021 Name: Anita Diaz MRN: [id number] DOB: October 31, 1955   Referred by: Ardith Dark, MD Reason for referral : No chief complaint on file.   An unsuccessful telephone outreach was attempted today. The patient was referred to the case management team for assistance with care management and care coordination.   Follow Up Plan: The care management team will reach out to the patient again over the next 7 days.   SIGNATURE  [place]

## 2021-02-21 NOTE — Progress Notes (Signed)
  Care Management  Note   02/21/2021 Name: Anita Diaz MRN: [id number] DOB: Sep 01, 1955  [name] is a 65 y.o. year old female who is a primary care patient of [name], Algis Greenhouse, MD. The care management team was consulted for assistance with chronic disease management and care coordination needs.   [name] was given information about Care Management services today including:  CCM service includes personalized support from designated clinical staff supervised by the physician, including individualized plan of care and coordination with other care providers 24/7 contact phone numbers for assistance for urgent and routine care needs. Service will only be billed when office clinical staff spend 20 minutes or more in a month to coordinate care. Only one practitioner may furnish and bill the service in a calendar month. The patient may stop CCM services at amy time (effective at the end of the month) by phone call to the office staff. The patient will be responsible for cost sharing (co-pay) or up to 20% of the service fee (after annual deductible is met)  Patient agreed to services and verbal consent obtained.  Follow up plan:   Face to Face appointment with care management team member scheduled for: 04/12/21 $[email or web address]   Noelle Penner Up[employer] Scheduler

## 2021-02-25 ENCOUNTER — Other Ambulatory Visit: Payer: Self-pay

## 2021-02-25 ENCOUNTER — Encounter: Payer: Self-pay | Admitting: Family Medicine

## 2021-02-25 ENCOUNTER — Ambulatory Visit (INDEPENDENT_AMBULATORY_CARE_PROVIDER_SITE_OTHER): Payer: Medicare Other | Admitting: Family Medicine

## 2021-02-25 VITALS — BP 112/70 | HR 53 | Temp 98.0°F | Ht 62.0 in | Wt 151.0 lb

## 2021-02-25 DIAGNOSIS — I10 Essential (primary) hypertension: Secondary | ICD-10-CM

## 2021-02-25 DIAGNOSIS — F321 Major depressive disorder, single episode, moderate: Secondary | ICD-10-CM | POA: Diagnosis not present

## 2021-02-25 DIAGNOSIS — I639 Cerebral infarction, unspecified: Secondary | ICD-10-CM

## 2021-02-25 DIAGNOSIS — G5702 Lesion of sciatic nerve, left lower limb: Secondary | ICD-10-CM | POA: Diagnosis not present

## 2021-02-25 DIAGNOSIS — Z8673 Personal history of transient ischemic attack (TIA), and cerebral infarction without residual deficits: Secondary | ICD-10-CM

## 2021-02-25 LAB — COMPREHENSIVE METABOLIC PANEL
ALT: 16 U/L (ref 0–35)
AST: 16 U/L (ref 0–37)
Albumin: 4.5 g/dL (ref 3.5–5.2)
Alkaline Phosphatase: 74 U/L (ref 39–117)
BUN: 19 mg/dL (ref 6–23)
CO2: 30 mEq/L (ref 19–32)
Calcium: 9.7 mg/dL (ref 8.4–10.5)
Chloride: 102 mEq/L (ref 96–112)
Creatinine, Ser: 0.9 mg/dL (ref 0.40–1.20)
GFR: 67.42 mL/min (ref >60.00)
Glucose, Bld: 95 mg/dL (ref 70–99)
Potassium: 4.7 mEq/L (ref 3.5–5.1)
Sodium: 141 mEq/L (ref 135–145)
Total Bilirubin: 0.9 mg/dL (ref 0.2–1.2)
Total Protein: 7.5 g/dL (ref 6.0–8.3)

## 2021-02-25 LAB — TSH: TSH: 1.21 u[IU]/mL (ref 0.35–5.50)

## 2021-02-25 LAB — CBC
HCT: 37.3 % (ref 36.0–46.0)
Hemoglobin: 12.2 g/dL (ref 12.0–15.0)
MCHC: 32.7 g/dL (ref 30.0–36.0)
MCV: 86.1 fl (ref 78.0–100.0)
Platelets: 327 10*3/uL (ref 150.0–400.0)
RBC: 4.33 Mil/uL (ref 3.87–5.11)
RDW: 13.6 % (ref 11.5–15.5)
WBC: 5.9 10*3/uL (ref 4.0–10.5)

## 2021-02-25 MED ORDER — LATUDA 20 MG PO TABS: 40.0000 mg | ORAL_TABLET | Freq: Every day | ORAL | 3 refills | Status: DC

## 2021-02-25 NOTE — Assessment & Plan Note (Signed)
At goal.  Continue current regimen Entresto 24-26 twice daily and coreg 12.5mg  twice daily. Check labs.

## 2021-02-25 NOTE — Progress Notes (Signed)
Please inform patient of the following:  Labs are all NORMAL.  [name]. [name], MD 02/25/2021 3:29 PM

## 2021-02-25 NOTE — Assessment & Plan Note (Signed)
Thankfully does not have any residual symptoms.  She is now on Plavix 75 mg daily.  She will be following up with the neurologist next month.

## 2021-02-25 NOTE — Patient Instructions (Signed)
It was very nice to see you today!  Please work on the exercises.  Let me know if not improving.  Will decrease your dose of Latuda to 40 mg daily.  Please send me a message in a few weeks to let me know how this is doing.  Take care, Dr [name]  PLEASE NOTE:  If you had any lab tests please let us know if you have not heard back within a few days. You may see your results on mychart before we have a chance to review them but we will give you a call once they are reviewed by [place]. If we ordered any referrals today, please let us know if you have not heard from their office within the next week.   Please try these tips to maintain a healthy lifestyle:  Eat at least 3 REAL meals and 1-2 snacks per day.  Aim for no more than 5 hours between eating.  If you eat breakfast, please do so within one hour of getting up.   Each meal should contain half fruits/vegetables, one quarter protein, and one quarter carbs (no bigger than a computer mouse)  Cut down on sweet beverages. This includes juice, soda, and sweet tea.   Drink at least 1 glass of water with each meal and aim for at least 8 glasses per day  Exercise at least 150 minutes every week.

## 2021-02-25 NOTE — Assessment & Plan Note (Addendum)
Her mood is stable but she is having some issues with weight loss.  This could be due to her Latuda.  We will decrease dose from 60 mg to 40 mg.  Continue Prozac 40 mg daily and Valium 5 mg twice daily as needed.  She will check back in with [place] in a few weeks.
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
## 2021-02-28 ENCOUNTER — Institutional Professional Consult (permissible substitution): Payer: Medicare Other | Admitting: Neurology

## 2021-03-01 ENCOUNTER — Other Ambulatory Visit: Payer: Self-pay | Admitting: *Deleted

## 2021-03-01 ENCOUNTER — Other Ambulatory Visit: Payer: Self-pay | Admitting: Internal Medicine

## 2021-03-01 ENCOUNTER — Telehealth: Payer: Self-pay

## 2021-03-01 MED ORDER — LURASIDONE HCL 40 MG PO TABS: 40.0000 mg | ORAL_TABLET | Freq: Every day | ORAL | 1 refills | Status: DC

## 2021-03-01 NOTE — Telephone Encounter (Signed)
Pharmacy send information, insurnce wont pay for 2 tab per day  Can medication be change to 40mg  daily.

## 2021-03-01 NOTE — Telephone Encounter (Signed)
Ok to send in 40mg  tablet.   . [name], MD 03/01/2021 3:53 PM

## 2021-03-01 NOTE — Telephone Encounter (Signed)
Patient is calling in stating insurance will not cover but one pill daily for the prescription lurasidone (LATUDA) 20 MG TABS tablet.  Wondering what the next steps should be.

## 2021-03-01 NOTE — Telephone Encounter (Signed)
Rx send to pharmacy  

## 2021-03-02 ENCOUNTER — Telehealth: Payer: Self-pay | Admitting: Family Medicine

## 2021-03-02 NOTE — Progress Notes (Signed)
  Care Management  Note   03/02/2021 Name: Anita Diaz MRN: [id number] DOB: 08-26-1955  Anita Diaz is a 65 y.o. year old female who is a primary care patient of [name], Algis Greenhouse, MD. The care management team was consulted for assistance with chronic disease management and care coordination needs.   Ms. Mantia was given information about Care Management services today including:  CCM service includes personalized support from designated clinical staff supervised by the physician, including individualized plan of care and coordination with other care providers 24/7 contact phone numbers for assistance for urgent and routine care needs. Service will only be billed when office clinical staff spend 20 minutes or more in a month to coordinate care. Only one practitioner may furnish and bill the service in a calendar month. The patient may stop CCM services at amy time (effective at the end of the month) by phone call to the office staff. The patient will be responsible for cost sharing (co-pay) or up to 20% of the service fee (after annual deductible is met)  Patient agreed to services and verbal consent obtained.  Follow up plan:   Face to Face appointment with care management team member scheduled for: 04/12/21 $[email or web address]'@11am'[email or web address]$   Noelle Penner [employer] Scheduler

## 2021-03-03 NOTE — Telephone Encounter (Signed)
Patient called in and stated that the medication was over $300 and she can not afford that patient wants to know if there is a alternate she could be prescribed instead.

## 2021-03-07 NOTE — Telephone Encounter (Signed)
See note

## 2021-03-07 NOTE — Telephone Encounter (Signed)
Patient calling back to f/u  

## 2021-03-08 NOTE — Telephone Encounter (Signed)
She can check with her insurance to see if there any alternatives. From previous message sounded like they would cover one pill per day but looks like that is not the case.  [name]. [name], MD 03/08/2021 3:40 PM

## 2021-03-09 NOTE — Telephone Encounter (Signed)
Ok for her to stay off. Please remove from her med list.  [name]. [name], MD 03/09/2021 10:57 AM

## 2021-03-09 NOTE — Telephone Encounter (Signed)
Patient stated doing good without Rx  Stated want to be without it , if ok. Please advise

## 2021-03-10 ENCOUNTER — Ambulatory Visit (INDEPENDENT_AMBULATORY_CARE_PROVIDER_SITE_OTHER): Payer: Medicare Other

## 2021-03-10 ENCOUNTER — Other Ambulatory Visit: Payer: Self-pay | Admitting: *Deleted

## 2021-03-10 DIAGNOSIS — I428 Other cardiomyopathies: Secondary | ICD-10-CM

## 2021-03-10 LAB — CUP PACEART REMOTE DEVICE CHECK
Battery Remaining Longevity: 114 mo
Battery Remaining Percentage: 100 %
Brady Statistic RA Percent Paced: 8 %
Brady Statistic RV Percent Paced: 3 %
Date Time Interrogation Session: 20221117044100
HighPow Impedance: 74 Ohm
Implantable Lead Implant Date: 20190718
Implantable Lead Implant Date: 20190718
Implantable Lead Implant Date: 20190718
Implantable Lead Location: 753858
Implantable Lead Location: 753859
Implantable Lead Location: 753860
Implantable Lead Model: 292
Implantable Lead Model: 4671
Implantable Lead Model: 7740
Implantable Lead Serial Number: 447121
Implantable Lead Serial Number: 694576
Implantable Lead Serial Number: 815914
Implantable Pulse Generator Implant Date: 20190718
Lead Channel Impedance Value: 468 Ohm
Lead Channel Impedance Value: 483 Ohm
Lead Channel Impedance Value: 575 Ohm
Lead Channel Setting Pacing Amplitude: 2 V
Lead Channel Setting Pacing Amplitude: 2.4 V
Lead Channel Setting Pacing Amplitude: 2.6 V
Lead Channel Setting Pacing Pulse Width: 0.4 ms
Lead Channel Setting Pacing Pulse Width: 0.4 ms
Lead Channel Setting Sensing Sensitivity: 0.5 mV
Lead Channel Setting Sensing Sensitivity: 1 mV
Pulse Gen Serial Number: 215046

## 2021-03-10 NOTE — Telephone Encounter (Signed)
Rx deleted  LVM to patient with information

## 2021-03-14 ENCOUNTER — Other Ambulatory Visit: Payer: Self-pay | Admitting: *Deleted

## 2021-03-14 ENCOUNTER — Telehealth: Payer: Self-pay

## 2021-03-14 MED ORDER — CLOPIDOGREL BISULFATE 75 MG PO TABS
75.0000 mg | ORAL_TABLET | Freq: Every day | ORAL | 1 refills | Status: DC
Start: 2021-03-14 — End: 2021-05-06

## 2021-03-14 NOTE — Telephone Encounter (Signed)
Rx sent to the pharmacy as requested. ?

## 2021-03-14 NOTE — Telephone Encounter (Signed)
LAST APPOINTMENT DATE:  02/25/21  NEXT APPOINTMENT DATE: none  MEDICATION:clopidogrel (PLAVIX) 75 MG tablet  PHARMACY: [facility] [address], [place] - [address]

## 2021-03-21 NOTE — Progress Notes (Signed)
Remote ICD transmission.   

## 2021-03-23 ENCOUNTER — Ambulatory Visit (INDEPENDENT_AMBULATORY_CARE_PROVIDER_SITE_OTHER): Payer: Medicare Other

## 2021-03-23 ENCOUNTER — Other Ambulatory Visit: Payer: Self-pay

## 2021-03-23 DIAGNOSIS — Z23 Encounter for immunization: Secondary | ICD-10-CM
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
## 2021-03-28 ENCOUNTER — Ambulatory Visit: Payer: Medicare Other | Admitting: Neurology

## 2021-03-28 ENCOUNTER — Encounter: Payer: Self-pay | Admitting: Neurology

## 2021-03-28 ENCOUNTER — Other Ambulatory Visit: Payer: Self-pay

## 2021-03-28 VITALS — BP 119/67 | HR 51 | Ht 62.0 in | Wt 150.8 lb

## 2021-03-28 DIAGNOSIS — R519 Headache, unspecified: Secondary | ICD-10-CM | POA: Diagnosis not present

## 2021-03-28 DIAGNOSIS — I6310 Cerebral infarction due to embolism of unspecified precerebral artery: Secondary | ICD-10-CM

## 2021-03-28 DIAGNOSIS — R0683 Snoring: Secondary | ICD-10-CM | POA: Diagnosis not present

## 2021-03-28 DIAGNOSIS — G4719 Other hypersomnia: Secondary | ICD-10-CM | POA: Diagnosis not present

## 2021-03-28 NOTE — Patient Instructions (Signed)
Stroke Prevention ?Some medical conditions and behaviors can lead to a higher chance of having a stroke. You can help prevent a stroke by eating healthy, exercising, not smoking, and managing any medical conditions you have. ?Stroke is a leading cause of functional impairment. Primary prevention is particularly important because a majority of strokes are first-time events. Stroke changes the lives of not only those who experience a stroke but also their family and other caregivers. ?How can this condition affect me? ?A stroke is a medical emergency and should be treated right away. A stroke can lead to brain damage and can sometimes be life-threatening. If a person gets medical treatment right away, there is a better chance of surviving and recovering from a stroke. ?What can increase my risk? ?The following medical conditions may increase your risk of a stroke: ?Cardiovascular disease. ?High blood pressure (hypertension). ?Diabetes. ?High cholesterol. ?Sickle cell disease. ?Blood clotting disorders (hypercoagulable state). ?Obesity. ?Sleep disorders (obstructive sleep apnea). ?Other risk factors include: ?Being older than age 60. ?Having a history of blood clots, stroke, or mini-stroke (transient ischemic attack, TIA). ?Genetic factors, such as race, ethnicity, or a family history of stroke. ?Smoking cigarettes or using other tobacco products. ?Taking birth control pills, especially if you also use tobacco. ?Heavy use of alcohol or drugs, especially cocaine and methamphetamine. ?Physical inactivity. ?What actions can I take to prevent this? ?Manage your health conditions ?High cholesterol levels. ?Eating a healthy diet is important for preventing high cholesterol. If cholesterol cannot be managed through diet alone, you may need to take medicines. ?Take any prescribed medicines to control your cholesterol as told by your health care provider. ?Hypertension. ?To reduce your risk of stroke, try to keep your blood  pressure below 130/80. ?Eating a healthy diet and exercising regularly are important for controlling blood pressure. If these steps are not enough to manage your blood pressure, you may need to take medicines. ?Take any prescribed medicines to control hypertension as told by your health care provider. ?Ask your health care provider if you should monitor your blood pressure at home. ?Have your blood pressure checked every year, even if your blood pressure is normal. Blood pressure increases with age and some medical conditions. ?Diabetes. ?Eating a healthy diet and exercising regularly are important parts of managing your blood sugar (glucose). If your blood sugar cannot be managed through diet and exercise, you may need to take medicines. ?Take any prescribed medicines to control your diabetes as told by your health care provider. ?Get evaluated for obstructive sleep apnea. Talk to your health care provider about getting a sleep evaluation if you snore a lot or have excessive sleepiness. ?Make sure that any other medical conditions you have, such as atrial fibrillation or atherosclerosis, are managed. ?Nutrition ?Follow instructions from your health care provider about what to eat or drink to help manage your health condition. These instructions may include: ?Reducing your daily calorie intake. ?Limiting how much salt (sodium) you use to 1,500 milligrams (mg) each day. ?Using only healthy fats for cooking, such as olive oil, canola oil, or sunflower oil. ?Eating healthy foods. You can do this by: ?Choosing foods that are high in fiber, such as whole grains, and fresh fruits and vegetables. ?Eating at least 5 servings of fruits and vegetables a day. Try to fill one-half of your plate with fruits and vegetables at each meal. ?Choosing lean protein foods, such as lean cuts of meat, poultry without skin, fish, tofu, beans, and nuts. ?Eating low-fat dairy products. ?Avoiding   foods that are high in sodium. This can help  lower blood pressure. ?Avoiding foods that have saturated fat, trans fat, and cholesterol. This can help prevent high cholesterol. ?Avoiding processed and prepared foods. ?Counting your daily carbohydrate intake. ? ?Lifestyle ?If you drink alcohol: ?Limit how much you have to: ?0-1 drink a day for women who are not pregnant. ?0-2 drinks a day for men. ?Know how much alcohol is in your drink. In the [place] one drink equals one 12 oz bottle of beer (355mL), one 5 oz glass of wine (148mL), or one 1? oz glass of hard liquor (44mL). ?Do not use any products that contain nicotine or tobacco. These products include cigarettes, chewing tobacco, and vaping devices, such as e-cigarettes. If you need help quitting, ask your health care provider. ?Avoid secondhand smoke. ?Do not use drugs. ?Activity ? ?Try to stay at a healthy weight. ?Get at least 30 minutes of exercise on most days, such as: ?Fast walking. ?Biking. ?Swimming. ?Medicines ?Take over-the-counter and prescription medicines only as told by your health care provider. Aspirin or blood thinners (antiplatelets or anticoagulants) may be recommended to reduce your risk of forming blood clots that can lead to stroke. ?Avoid taking birth control pills. Talk to your health care provider about the risks of taking birth control pills if: ?You are over 35 years old. ?You smoke. ?You get very bad headaches. ?You have had a blood clot. ?Where to find more information ?American Stroke Association: www.strokeassociation.org ?Get help right away if: ?You or a loved one has any symptoms of a stroke. "BE FAST" is an easy way to remember the main warning signs of a stroke: ?B - Balance. Signs are dizziness, sudden trouble walking, or loss of balance. ?E - Eyes. Signs are trouble seeing or a sudden change in vision. ?F - Face. Signs are sudden weakness or numbness of the face, or the face or eyelid drooping on one side. ?A - Arms. Signs are weakness or numbness in an arm. This happens  suddenly and usually on one side of the body. ?S - Speech. Signs are sudden trouble speaking, slurred speech, or trouble understanding what people say. ?T - Time. Time to call emergency services. Write down what time symptoms started. ?You or a loved one has other signs of a stroke, such as: ?A sudden, severe headache with no known cause. ?Nausea or vomiting. ?Seizure. ?These symptoms may represent a serious problem that is an emergency. Do not wait to see if the symptoms will go away. Get medical help right away. Call your local emergency services (911 in the [place] Do not drive yourself to the hospital. ?Summary ?You can help to prevent a stroke by eating healthy, exercising, not smoking, limiting alcohol intake, and managing any medical conditions you may have. ?Do not use any products that contain nicotine or tobacco. These include cigarettes, chewing tobacco, and vaping devices, such as e-cigarettes. If you need help quitting, ask your health care provider. ?Remember "BE FAST" for warning signs of a stroke. Get help right away if you or a loved one has any of these signs. ?This information is not intended to replace advice given to you by your health care provider. Make sure you discuss any questions you have with your health care provider. ?Document Revised: 11/10/2019 Document Reviewed: 11/10/2019 ?Elsevier Patient Education ? 2022 [employer] ? ?

## 2021-03-31 ENCOUNTER — Other Ambulatory Visit: Payer: Self-pay | Admitting: Internal Medicine

## 2021-04-06 ENCOUNTER — Telehealth: Payer: Self-pay | Admitting: Pharmacist

## 2021-04-06 NOTE — Chronic Care Management (AMB) (Signed)
Chronic Care Management Pharmacy Assistant   Name: Anita Diaz  MRN: [id number] DOB: Jun 12, 1955   Reason for Encounter: Chart Review For Initial Visit With Clinical Pharmacist   Conditions to be addressed/monitored: HTN, CHF, CVA, OA, Depression, Chronic Pain, Insomnia, HLD, ICD  Primary concerns for visit include: HTN, CHF, HLD  Recent office visits:  02/25/2021 OV (PCP) Ardith Dark, MD; Her mood is stable but she is having some issues with weight loss.  This could be due to her Latuda.  We will decrease dose from 60 mg to 40 mg.  Continue Prozac 40 mg daily and Valium 5 mg twice daily as needed  11/05/2020 OV (PCP) Ardith Dark, MD; A little bit better with addition of Abilify though feels like it to be a stronger dose.  We will increase to 10 mg daily.  Continue Wellbutrin 300 mg daily and Prozac 40 mg daily.  She can increase her Abilify to 15 mg daily if needed over the next week or so.  She will check in with me in a couple of weeks to let me know how this is doing.  10/18/2020 OV (PCP) Ardith Dark, MD; Depression Not well controlled.  Exacerbated by recent passing of her husband.  We discussed treatment options.  We will continue Wellbutrin 300 mg daily and Prozac 40 mg daily.  Add on Abilify 5 mg daily.  We will also start low-dose Valium to use as needed for anxiety/panic attacks.  She will check in with me in a couple weeks.  Depending on response we could titrate dose of abilify, try alternative augmenting medication, or try SNRI  Recent consult visits:  03/28/2021 OV (Neurology) Ahem, Griselda Miner, MD; no medication changes indicated.  01/21/2021 OV (Cardiology) Ronney Asters, NP; Discuss the possibility of starting diltiazem 60 mgin the future if increased palpitations.  Hospital visits:  01/11/2021 ED to Hospital Admission due to Confusion Admit date: 01/11/2021 Discharge date: 01/14/2021  ASA plus plavix for 21 days then plavix alone Statin added   -On  multiple medications for her depression and unsure if taking as prescribed as discussed above. -We will continue her Prozac, and Latuda.  She states she has not been taking her Wellbutrin so will not continue however per office notes it seems that she is supposed to be on this. -Holding Abilify as she takes this on an as-needed basis and is already on Latuda   Medications: Outpatient Encounter Medications as of 04/06/2021  Medication Sig Note   atorvastatin (LIPITOR) 40 MG tablet Take 1 tablet (40 mg total) by mouth daily.    carvedilol (COREG) 12.5 MG tablet TAKE 1 & 1/2 (ONE & ONE-HALF) TABLETS BY MOUTH TWICE DAILY. Please make overdue appt with Dr. Ladona Ridgel before anymore refills. Thank you 2nd attempt    clopidogrel (PLAVIX) 75 MG tablet Take 1 tablet (75 mg total) by mouth daily.    diazepam (VALIUM) 5 MG tablet Take 1 tablet (5 mg total) by mouth every 12 (twelve) hours as needed for anxiety.    FLUoxetine (PROZAC) 40 MG capsule Take 1 capsule by mouth once daily    furosemide (LASIX) 40 MG tablet Take 1 tablet by mouth once daily    levocetirizine (XYZAL) 5 MG tablet Take 5 mg by mouth every evening.    loperamide (IMODIUM A-D) 2 MG tablet Take 4 mg by mouth daily.    nitroGLYCERIN (NITROSTAT) 0.4 MG SL tablet PLACE 1 TABLET UNDER THE TONGUE EVERY 5 MINUTES  AS NEEDED FOR CHEST PAIN 01/11/2021: Has on hand   sacubitril-valsartan (ENTRESTO) 24-26 MG Take 1 tablet by mouth 2 (two) times daily.    No facility-administered encounter medications on file as of 04/06/2021.   Current Medications: Carvedilol 12.5 mg last filled 03/02/2021 30 DS Clopidogrel 75 mg last filled 03/14/2021 30 DS Atorvastatin 40 mg last filled 03/20/2021 30 DS Fluoxetine 40 mg last filled 02/23/2021 90 DS Entresto 24-26 mg last filled 12/06/2020 90 DS Diazepam 5 mg last filled 12/24/2020 15 DS Furosemide 40 mg last filled 01/27/2021 90 DS Nitroglycerin 0.4 mg Levocetirizine 5 mg Imodium A-D 2 mg  Patient  cancelled and reschedule her appointment with the clinical paharmist. She said she works on Tuesdays and is not able to have an appointment on Tuesday. She rescheduled her appointment for 05/02/2021 at 3 pm.  Care Gaps: Medicare Annual Wellness: Due now Hemoglobin A1C: 5.5% on 01/12/2021 Colonoscopy: Next due on 02/22/2022 Mammogram: Next due on 06/14/2022  Future Appointments  Date Time Provider [place]  05/20/2021  8:00 AM [name], MD [facility]  06/09/2021  7:00 AM [facility] DEVICE REMOTES [facility]  09/08/2021  7:00 AM [facility] DEVICE REMOTES [facility]  09/26/2021  8:30 AM Ward Givens, NP [facility] None  12/08/2021  7:00 AM [facility] DEVICE REMOTES [facility]  03/09/2022  7:00 AM [facility] DEVICE REMOTES [facility]     Star Rating Drugs: Atorvastatin 40 mg last filled 03/20/2021 30 DS Entresto 24-26 mg last filled 12/06/2020 90 DS  April D Calhoun, [place] Pharmacist Assistant [phone]

## 2021-04-12 ENCOUNTER — Ambulatory Visit: Payer: Medicare Other

## 2021-04-25 ENCOUNTER — Other Ambulatory Visit: Payer: Self-pay | Admitting: Internal Medicine

## 2021-04-27 ENCOUNTER — Telehealth: Payer: Self-pay | Admitting: Pharmacist

## 2021-04-27 NOTE — Progress Notes (Signed)
Chronic Care Management Pharmacy Note  05/03/2021 Name:  Anita Diaz MRN:  [id number] DOB:  1955-05-01  Summary: Initial visit with PharmD.  Patient mentions she is only taking fluoxetine for her depression.  Unclear why stopped Abilify or Wellbutrin.  She knows she stopped Latuda due to cost.  All other meds reviewed and updated.  Tolerating statin well since elevated lipids in September.  Recommendations/Changes made from today's visit: Possible lingering depression - could consider restarting Abilify  Plan: FU 6 months   Subjective: Anita Diaz is an 66 y.o. year old female who is a primary patient of [name], MD.  The CCM team was consulted for assistance with disease management and care coordination needs.    Engaged with patient by telephone for initial visit in response to provider referral for pharmacy case management and/or care coordination services.   Consent to Services:  The patient was given the following information about Chronic Care Management services today, agreed to services, and gave verbal consent: 1. CCM service includes personalized support from designated clinical staff supervised by the primary care provider, including individualized plan of care and coordination with other care providers 2. 24/7 contact phone numbers for assistance for urgent and routine care needs. 3. Service will only be billed when office clinical staff spend 20 minutes or more in a month to coordinate care. 4. Only one practitioner may furnish and bill the service in a calendar month. 5.The patient may stop CCM services at any time (effective at the end of the month) by phone call to the office staff. 6. The patient will be responsible for cost sharing (co-pay) of up to 20% of the service fee (after annual deductible is met). Patient agreed to services and consent obtained.  Patient Care Team: [name], MD as PCP - General (Family Medicine) [name], MD as PCP -  Cardiology (Cardiology) Edythe Clarity, [facility] as Pharmacist (Pharmacist)  Recent office visits:  02/25/2021 OV (PCP) [name], MD; Her mood is stable but she is having some issues with weight loss. This could be due to her Latuda. We will decrease dose from 60 mg to 40 mg. Continue Prozac 40 mg daily and Valium 5 mg twice daily as needed.   11/05/2020 OV (PCP) [name], MD; A little bit better with addition of Abilify thought if feels like it to be a stronger dose. We will increase to 10 mg daily. Continue Wellbutrin 300 mg daily and Prozac 40 mg daily. She can increase her Abilify to 15 mg daily if needed over the next week or so. She will check in with me in a couple of weeks to let me know how this is doing.   10/18/2020 OV (PCP) [name], MD; Depression not well controlled. Exacerbated by recent passing of her husband. We discussed treatment options. We will continue Wellbutrin 300 mg daily and Prozac 40 mg daily. Add on Abilify 5 mg daily. We will also start low-dose Valium to use as needed for anxiety/panic attacks. She will check in with me in a couple weeks. Depending on response we could titrate dose of Abilify, try alternative augmenting medication, or try SNRI.   Recent consult visits:  03/28/2021 OV (Neurology) Ahem, Larina Bras, MD; no medication changes indicated.   01/21/2021 OV (Cardiology) Deberah Pelton, NP; Discuss the possibility of starting Diltiazem 60 mg in the future if increased palpitations.   Hospital visits:  01/11/2021 ED to [facility] Admission  due to Confusion Admit date: 01/11/2021 Discharge date: 01/14/2021   ASA plus plavis for 21 days then plavis alone Statin added   -On multiple medications for her depression and unsure if taking as prescribed as discussed above. -We will continue her Prozac, and Latuda. She states she has not been taking her Wellbutrin so will not continue however per office notes it seems that she is supposed to be on  this. -Holding Abilify as she takes this on an as-needed basis and is already on [place].   Objective:  Lab Results  Component Value Date   CREATININE 0.90 02/25/2021   BUN 19 02/25/2021   GFR 67.42 02/25/2021   GFRNONAA >60 01/10/2021   GFRAA 65 05/07/2020   NA 141 02/25/2021   K 4.7 02/25/2021   CALCIUM 9.7 02/25/2021   CO2 30 02/25/2021   GLUCOSE 95 02/25/2021    Lab Results  Component Value Date/Time   HGBA1C 5.5 01/12/2021 02:19 AM   GFR 67.42 02/25/2021 09:35 AM   GFR 65.10 07/16/2020 12:05 PM    Last diabetic Eye exam: No results found for: HMDIABEYEEXA  Last diabetic Foot exam: No results found for: HMDIABFOOTEX   Lab Results  Component Value Date   CHOL 209 (H) 01/12/2021   HDL 53 01/12/2021   LDLCALC 123 (H) 01/12/2021   TRIG 163 (H) 01/12/2021   CHOLHDL 3.9 01/12/2021    Hepatic Function Latest Ref Rng & Units 02/25/2021 01/10/2021 07/16/2020  Total Protein 6.0 - 8.3 g/dL 7.5 7.1 7.3  Albumin 3.5 - 5.2 g/dL 4.5 4.3 4.7  AST 0 - 37 U/L 16 18 16   ALT 0 - 35 U/L 16 15 16   Alk Phosphatase 39 - 117 U/L 74 72 68  Total Bilirubin 0.2 - 1.2 mg/dL 0.9 0.3 0.8  Bilirubin, Direct 0.00 - 0.40 mg/dL - - -    Lab Results  Component Value Date/Time   TSH 1.21 02/25/2021 09:35 AM   TSH 1.547 01/11/2021 07:30 PM   TSH 1.200 04/28/2019 08:58 AM    CBC Latest Ref Rng & Units 02/25/2021 01/10/2021 07/16/2020  WBC 4.0 - 10.5 K/uL 5.9 9.1 6.8  Hemoglobin 12.0 - 15.0 g/dL 12.2 12.0 12.3  Hematocrit 36.0 - 46.0 % 37.3 36.1 37.3  Platelets 150.0 - 400.0 K/uL 327.0 331 375.0    Lab Results  Component Value Date/Time   VD25OH 30.46 08/29/2017 08:37 AM    Clinical ASCVD: Yes  The ASCVD Risk score (Arnett DK, et al., 2019) failed to calculate for the following reasons:   The patient has a prior MI or stroke diagnosis    Depression screen [facility] 2/9 02/25/2021 02/25/2021 11/05/2020  Decreased Interest 0 0 1  Down, Depressed, Hopeless 0 0 2  PHQ - 2 Score 0 0 3  Altered  sleeping 0 - 3  Tired, decreased energy 0 - 2  Change in appetite 0 - 1  Feeling bad or failure about yourself  0 - 1  Trouble concentrating 0 - 3  Moving slowly or fidgety/restless 0 - 1  Suicidal thoughts 0 - 1  PHQ-9 Score 0 - 15  Difficult doing work/chores Not difficult at all - -     Social History   Tobacco Use  Smoking Status Former   Packs/day: 2.00   Years: 37.00   Pack years: 74.00   Types: Cigarettes   Quit date: 06/22/2008   Years since quitting: 12.8  Smokeless Tobacco Never   BP Readings from Last 3 Encounters:  03/28/21  119/67  02/25/21 112/70  01/21/21 126/68   Pulse Readings from Last 3 Encounters:  03/28/21 (!) 51  02/25/21 (!) 53  01/21/21 (!) 58   Wt Readings from Last 3 Encounters:  03/28/21 150 lb 12.8 oz (68.4 kg)  02/25/21 151 lb (68.5 kg)  01/21/21 145 lb (65.8 kg)   BMI Readings from Last 3 Encounters:  03/28/21 27.58 kg/m  02/25/21 27.62 kg/m  01/21/21 26.52 kg/m    Assessment/Interventions: Review of patient past medical history, allergies, medications, health status, including review of consultants reports, laboratory and other test data, was performed as part of comprehensive evaluation and provision of chronic care management services.   SDOH:  (Social Determinants of Health) assessments and interventions performed: Yes  Financial Resource Strain: Low Risk    Difficulty of Paying Living Expenses: Not very hard    SDOH Screenings   Alcohol Screen: Not on file  Depression (PHQ2-9): Low Risk    PHQ-2 Score: 0  Financial Resource Strain: Low Risk    Difficulty of Paying Living Expenses: Not very hard  Food Insecurity: Not on file  Housing: Not on file  Physical Activity: Not on file  Social Connections: Not on file  Stress: Not on file  Tobacco Use: Medium Risk   Smoking Tobacco Use: Former   Smokeless Tobacco Use: Never   Passive Exposure: Not on file  Transportation Needs: Not on file    [place]  Allergies   Allergen Reactions   Sulfa Antibiotics Other (See Comments)    Childhood allergy    Medications Reviewed Today     Reviewed by Edythe Clarity, [facility] (Pharmacist) on 05/03/21 at [phone]  Med List Status: <None>   Medication Order Taking? Sig Documenting Provider Last Dose Status Informant  atorvastatin (LIPITOR) 40 MG tablet [id number] Yes Take 1 tablet (40 mg total) by mouth daily. Deberah Pelton, NP Taking Active   carvedilol (COREG) 12.5 MG tablet [id number] Yes TAKE 1 & 1/2 (ONE & ONE-HALF) TABLETS BY MOUTH TWICE DAILY . APPOINTMENT REQUIRED FOR FUTURE REFILLS [name], MD Taking Active   clopidogrel (PLAVIX) 75 MG tablet [id number] Yes Take 1 tablet (75 mg total) by mouth daily. [name], MD Taking Active   diazepam (VALIUM) 5 MG tablet [id number]  Take 1 tablet (5 mg total) by mouth every 12 (twelve) hours as needed for anxiety. [name], MD  Active Multiple Informants  FLUoxetine (PROZAC) 40 MG capsule [id number] Yes Take 1 capsule by mouth once daily [name], MD Taking Active   furosemide (LASIX) 40 MG tablet [id number]  Take 1 tablet by mouth once daily Evans Lance, MD  Active Multiple Informants  levocetirizine (XYZAL) 5 MG tablet [id number]  Take 5 mg by mouth every evening. [provider]  Active Multiple Informants  loperamide (IMODIUM A-D) 2 MG tablet [id number]  Take 4 mg by mouth daily. [provider]  Active Multiple Informants  nitroGLYCERIN (NITROSTAT) 0.4 MG SL tablet [id number]  PLACE 1 TABLET UNDER THE TONGUE EVERY 5 MINUTES AS NEEDED FOR CHEST PAIN Burtis Junes, NP  Active Multiple Informants           Med Note Nigel Mormon, KIMBERLY L   Tue Jan 11, 2021 10:53 AM) Has on hand  sacubitril-valsartan (ENTRESTO) 24-26 MG [id number]  Take 1 tablet by mouth 2 (two) times daily. 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/2019   SVT (supraventricular tachycardia) ([place]) 06/17/2019   TMJ syndrome 54/62/7035   Chronic systolic heart failure ([place]) 11/08/2017   B12 deficiency 10/30/2017   Chronic left shoulder pain 08/29/2017   Neck pain 08/29/2017   Other fatigue 08/29/2017   Heart failure with reduced ejection fraction ([place]) 08/01/2017   Osteoarthritis, hand 08/01/2017   Chest pain at rest 06/29/2017   Diarrhea 06/13/2017   Chronic pain    Depression, major, single episode, moderate ([place])    Insomnia    Hyperlipidemia    Hypertension     Immunization History  Administered Date(s) Administered   Tdap 03/23/2021    Conditions to be addressed/monitored:  Hypertension, HFrEF, Depression, HLD, Insomnia  Care Plan : General Pharmacy (Adult)  Updates made by Edythe Clarity, RPH since 05/03/2021 12:00 AM     Problem: Hypertension, HFrEF, Depression, HLD, Insomnia   Priority: High  Onset Date: 05/02/2021     Long-Range Goal: Patient-Specific Goal   Start Date: 05/02/2021  Expected End Date: 10/31/2021  This Visit's Progress: On track  Priority: High  Note:   Current Barriers:  Unable to independently afford treatment regimen Unable to maintain control of depression  Pharmacist Clinical Goal(s):  Patient will achieve improvement in depression and lipids as evidenced by labs/symptoms through collaboration with PharmD and provider.   Interventions: 1:1 collaboration with [name], MD regarding development and update of comprehensive plan of care as evidenced by provider attestation and co-signature Inter-disciplinary care team collaboration (see longitudinal plan of care) Comprehensive medication review performed; medication list updated in electronic medical record  Hypertension (BP goal <130/80) -Controlled, based on recent office  readings -Current treatment: Carvedilol 12.66m BID - Appropriate, Effective, Safe, Accessible -Medications previously tried: lisinopril  -Current home readings: not checking at home -Current exercise habits: minimal organized physical activity, she is active cleaning houses for work -Denies hypotensive/hypertensive symptoms -Educated on BP goals and benefits of medications for prevention of heart attack, stroke and kidney damage; Importance of home blood pressure monitoring; Symptoms of hypotension and importance of maintaining adequate hydration; -Counseled to monitor BP at home if able, document, and provide log at future appointments -Recommended to continue current medication  Hyperlipidemia: (LDL goal < 70) -Uncontrolled -Current treatment: Atorvastatin 477mdaily - Appropriate, Effective, Safe, Accessible -Medications previously tried: none noted  -Educated on Cholesterol goals;  Benefits of statin for ASCVD risk reduction; Importance of limiting foods high in cholesterol; Most recent LDL is elevated, she started Lipitor after this lipid panel, tolerating medication fine at this time.  Denies any cramping or myalgias -Recommended to continue current medication Work on limiting fried/fatty foods, recheck lipids next OV  Heart Failure (Goal: manage symptoms and prevent exacerbations) -Controlled -Last ejection fraction: 45-50% -Current treatment: Furosemide 4059maily - Appropriate, Effective, Safe, Accessible Entresto 24-26m58mily - Appropriate, Effective, Safe, Accessible Carvedilol 12.5mg 16m - Appropriate, Effective, Safe, Accessible -Medications previously tried: none noted  -Current home BP/HR readings: not checking at home -Educated on Benefits of medications for managing symptoms and prolonging life Importance of weighing daily; if you gain more than 3 pounds in one day or 5 pounds in one week, contact providers Importance of blood pressure control -Recommended to  continue current medication Patient is monitoring fluid status, she denies any swelling or SOB  at this time.  Has gained some weight but attributes this to dietary sources and not fluid. Continue to monitor weight, contact providers with any swelling.  Depression/Anxiety (Goal: Reduce symptoms) -Not ideally controlled -Current treatment: Fluoxetine 26m daily - Appropriate, Query effective, -Medications previously tried/failed: Latuda ($), Abilify, Wellbutrin, citalopram -PHQ9:  [place] PHQ-9 Total Score 0     -GAD7: No flowsheet data found. -Educated on Benefits of medication for symptom control No longer taking her Abilify or Wellbutrin.  Reports that her LAnette Guarneriwas expensive this is why she had to stop.  No patient assistance available for patients on Medicare.  I feel she may benefit from restarting Abilify due to ongoing lingering depression. -Recommended to continue current medication Will consult with PCP on resuming Abilify.  Insomnia (Goal: Improve sleep) -Not ideally controlled -Current treatment  Diazepam [id number] prn anxiety -Medications previously tried: trazodone -Sleep is off and on  -Recommended to continue current medication Possible untreated depression leading to sleep disturbances.  Patient Goals/Self-Care Activities Patient will:  - take medications as prescribed as evidenced by patient report and record review check blood pressure as able, document, and provide at future appointments weigh daily, and contact provider if weight gain of 3 lbs in one day or 5 lbs in one week  Follow Up Plan: The care management team will reach out to the patient again over the next 180 days.       Medication Assistance: None required.  Patient affirms current coverage meets needs.  Compliance/Adherence/Medication fill history: Care Gaps: Zoster Vaccine  Star-Rating Drugs: Atorvastatin 4026m12/26/22 90ds  Patient[facility]:  [facility] [place] [place]: [phone]: [phone] pill box? Yes Pt endorses 100% compliance  We discussed: Benefits of medication synchronization, packaging and delivery as well as enhanced pharmacist oversight with [employer]. Patient decided to: Continue current medication management strategy  Care Plan and Follow Up Patient Decision:  Patient agrees to Care Plan and Follow-up.  Plan: The care management team will reach out to the patient again over the next 180 days.  ChrBeverly MilchharmD Clinical Pharmacist  [phone]

## 2021-04-27 NOTE — Chronic Care Management (AMB) (Signed)
Chronic Care Management Pharmacy Assistant   Name: Anita Diaz  MRN: [id number] DOB: 1955/06/11   Reason for Encounter: Chart Review For Initial Visit With Clinical Pharmacist   Conditions to be addressed/monitored: HTN, CHF, CVA, OA, Depression, Chronic Pain, Insomnia, HLD, ICD  Primary concerns for visit include: HTN, CHF, HLD  Recent office visits:  02/25/2021 OV (PCP) Ardith Dark, MD; Her mood is stable but she is having some issues with weight loss. This could be due to her Latuda. We will decrease dose from 60 mg to 40 mg. Continue Prozac 40 mg daily and Valium 5 mg twice daily as needed.  11/05/2020 OV (PCP) Ardith Dark, MD; A little bit better with addition of Abilify thought if feels like it to be a stronger dose. We will increase to 10 mg daily. Continue Wellbutrin 300 mg daily and Prozac 40 mg daily. She can increase her Abilify to 15 mg daily if needed over the next week or so. She will check in with me in a couple of weeks to let me know how this is doing.  10/18/2020 OV (PCP) Ardith Dark, MD; Depression not well controlled. Exacerbated by recent passing of her husband. We discussed treatment options. We will continue Wellbutrin 300 mg daily and Prozac 40 mg daily. Add on Abilify 5 mg daily. We will also start low-dose Valium to use as needed for anxiety/panic attacks. She will check in with me in a couple weeks. Depending on response we could titrate dose of Abilify, try alternative augmenting medication, or try SNRI.  Recent consult visits:  03/28/2021 OV (Neurology) Ahem, Griselda Miner, MD; no medication changes indicated.  01/21/2021 OV (Cardiology) Ronney Asters, NP; Discuss the possibility of starting Diltiazem 60 mg in the future if increased palpitations.  Hospital visits:  01/11/2021 ED to Hospital Admission due to Confusion Admit date: 01/11/2021 Discharge date: 01/14/2021  ASA plus plavis for 21 days then plavis alone Statin added  -On  multiple medications for her depression and unsure if taking as prescribed as discussed above. -We will continue her Prozac, and Latuda. She states she has not been taking her Wellbutrin so will not continue however per office notes it seems that she is supposed to be on this. -Holding Abilify as she takes this on an as-needed basis and is already on [place].  Medications: Outpatient Encounter Medications as of 04/27/2021  Medication Sig Note   atorvastatin (LIPITOR) 40 MG tablet Take 1 tablet (40 mg total) by mouth daily.    carvedilol (COREG) 12.5 MG tablet TAKE 1 & 1/2 (ONE & ONE-HALF) TABLETS BY MOUTH TWICE DAILY . APPOINTMENT REQUIRED FOR FUTURE REFILLS    clopidogrel (PLAVIX) 75 MG tablet Take 1 tablet (75 mg total) by mouth daily.    diazepam (VALIUM) 5 MG tablet Take 1 tablet (5 mg total) by mouth every 12 (twelve) hours as needed for anxiety.    FLUoxetine (PROZAC) 40 MG capsule Take 1 capsule by mouth once daily    furosemide (LASIX) 40 MG tablet Take 1 tablet by mouth once daily    levocetirizine (XYZAL) 5 MG tablet Take 5 mg by mouth every evening.    loperamide (IMODIUM A-D) 2 MG tablet Take 4 mg by mouth daily.    nitroGLYCERIN (NITROSTAT) 0.4 MG SL tablet PLACE 1 TABLET UNDER THE TONGUE EVERY 5 MINUTES AS NEEDED FOR CHEST PAIN 01/11/2021: Has on hand   sacubitril-valsartan (ENTRESTO) 24-26 MG Take 1 tablet by mouth 2 (two) times  daily.    No facility-administered encounter medications on file as of 04/27/2021.   Current Medications: Carvedilol 12.5 mg last filled 04/01/2021 15 DS Clopidogrel 75 mg last filled 04/06/2021 30 DS Atorvastatin 40 mg last filled 04/18/2021 30 DS Entresto 24-26 mg last filled 12/06/2020 90 DS Diazepam 5 mg last filled 12/24/2020 15 DS Furosemide 40 mg last filled 01/27/2021 90 DS Nitroglycerin 0.4 mg Levocetirizine 5 mg Imodium A-D 2 mg  Patient Questions: Any changes in your medications or health? Patient states she recently stopped taking  Latuda.  Any side effects from any medications?  Patient denies having any side effects from any of her medications that she is aware of.  Do you have any symptoms or problems not managed by your medications? Patient states she has trouble sleeping at times.  Any concerns about your health right now? "No, I feel great."  Has your provider asked that you check blood pressure, blood sugar, or follow special diet at home? Patient does not currently check blood sugar or blood pressure. She does not follow any special diet.  Do you get any type of exercise on a regular basis? 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  Can you think of a goal you would like to reach for your health? Patient states she would like to lose weight.  Do you have any problems getting your medications? Patient denies having any problems getting her medications.  Is there anything that you would like to discuss during the appointment?  Patient states she would like to discuss her medications.  Please bring medications and supplements to appointment  Care Gaps: Medicare Annual Wellness: Due now - patient scheduled for 05/23/2021 Hemoglobin A1C: 5.5% on 01/12/2021 Colonoscopy: Next due on 02/22/2022 Mammogram: Next due on 06/14/2022  Future Appointments  Date Time Provider Level [place]  05/02/2021  3:00 PM [facility] CCM PHARMACIST [facility]  05/20/2021  8:00 AM [name], MD [facility]  06/09/2021  7:00 AM [facility] DEVICE REMOTES [facility]  09/08/2021  7:00 AM [facility] DEVICE REMOTES [facility]  09/26/2021  8:30 AM Ward Givens, NP [facility] None  12/08/2021  7:00 AM [facility] DEVICE REMOTES [facility]  03/09/2022  7:00 AM [facility] DEVICE REMOTES [facility]   Star Rating Drugs: Atorvastatin 40 mg last filled 04/18/2021 30 DS Entresto 24-26 mg last filled 12/06/2020 90 DS  April D Calhoun,  [place] Pharmacist Assistant [phone]

## 2021-05-02 ENCOUNTER — Ambulatory Visit (INDEPENDENT_AMBULATORY_CARE_PROVIDER_SITE_OTHER): Payer: Medicare Other | Admitting: Pharmacist

## 2021-05-02 DIAGNOSIS — F321 Major depressive disorder, single episode, moderate: Secondary | ICD-10-CM

## 2021-05-02 DIAGNOSIS — I5020 Unspecified systolic (congestive) heart failure: Secondary | ICD-10-CM

## 2021-05-02 DIAGNOSIS — I10 Essential (primary) hypertension: Secondary | ICD-10-CM

## 2021-05-02 DIAGNOSIS — E7800 Pure hypercholesterolemia, unspecified: Secondary | ICD-10-CM

## 2021-05-03 NOTE — Patient Instructions (Addendum)
Visit Information   Goals Addressed             This Visit's Progress    Track and Manage My Symptoms-Depression       Timeframe:  Long-Range Goal Priority:  High Start Date:  05/03/21                           Expected End Date:  10/31/21                     Follow Up Date 08/01/21    - develop a personal safety plan - have a plan for how to handle bad days - spend time or talk with others at least 2 to 3 times per week - watch for early signs of feeling worse    Why is this important?   Keeping track of your progress will help your treatment team find the right mix of medicine and therapy for you.  Write in your journal every day.  Day-to-day changes in depression symptoms are normal. It may be more helpful to check your progress at the end of each week instead of every day.     Notes:        Patient Care Plan: General Pharmacy (Adult)     Problem Identified: Hypertension, HFrEF, Depression, HLD, Insomnia   Priority: High  Onset Date: 05/02/2021     Long-Range Goal: Patient-Specific Goal   Start Date: 05/02/2021  Expected End Date: 10/31/2021  This Visit's Progress: On track  Priority: High  Note:   Current Barriers:  Unable to independently afford treatment regimen Unable to maintain control of depression  Pharmacist Clinical Goal(s):  Patient will achieve improvement in depression and lipids as evidenced by labs/symptoms through collaboration with PharmD and provider.   Interventions: 1:1 collaboration with [name], MD regarding development and update of comprehensive plan of care as evidenced by provider attestation and co-signature Inter-disciplinary care team collaboration (see longitudinal plan of care) Comprehensive medication review performed; medication list updated in electronic medical record  Hypertension (BP goal <130/80) -Controlled, based on recent office readings -Current treatment: Carvedilol 12.5mg  BID - Appropriate, Effective, Safe,  Accessible -Medications previously tried: lisinopril  -Current home readings: not checking at home -Current exercise habits: minimal organized physical activity, she is active cleaning houses for work -Denies hypotensive/hypertensive symptoms -Educated on BP goals and benefits of medications for prevention of heart attack, stroke and kidney damage; Importance of home blood pressure monitoring; Symptoms of hypotension and importance of maintaining adequate hydration; -Counseled to monitor BP at home if able, document, and provide log at future appointments -Recommended to continue current medication  Hyperlipidemia: (LDL goal < 70) -Uncontrolled -Current treatment: Atorvastatin 40mg  daily - Appropriate, Effective, Safe, Accessible -Medications previously tried: none noted  -Educated on Cholesterol goals;  Benefits of statin for ASCVD risk reduction; Importance of limiting foods high in cholesterol; Most recent LDL is elevated, she started Lipitor after this lipid panel, tolerating medication fine at this time.  Denies any cramping or myalgias -Recommended to continue current medication Work on limiting fried/fatty foods, recheck lipids next OV  Heart Failure (Goal: manage symptoms and prevent exacerbations) -Controlled -Last ejection fraction: 45-50% -Current treatment: Furosemide 40mg  daily - Appropriate, Effective, Safe, Accessible Entresto 24-26mg  daily - Appropriate, Effective, Safe, Accessible Carvedilol 12.5mg  BID - Appropriate, Effective, Safe, Accessible -Medications previously tried: none noted  -Current home BP/HR readings: not checking at home -Educated on Benefits of medications  for managing symptoms and prolonging life Importance of weighing daily; if you gain more than 3 pounds in one day or 5 pounds in one week, contact providers Importance of blood pressure control -Recommended to continue current medication Patient is monitoring fluid status, she denies any  swelling or SOB at this time.  Has gained some weight but attributes this to dietary sources and not fluid. Continue to monitor weight, contact providers with any swelling.  Depression/Anxiety (Goal: Reduce symptoms) -Not ideally controlled -Current treatment: Fluoxetine 40mg  daily - Appropriate, Query effective, -Medications previously tried/failed: Latuda ($), Abilify, Wellbutrin, citalopram -PHQ9:  [place] Office Visit from 02/25/2021 in [facility]  PHQ-9 Total Score 0     -GAD7: No flowsheet data found. -Educated on Benefits of medication for symptom control No longer taking her Abilify or Wellbutrin.  Reports that her Anette Guarneri was expensive this is why she had to stop.  No patient assistance available for patients on Medicare.  I feel she may benefit from restarting Abilify due to ongoing lingering depression. -Recommended to continue current medication Will consult with PCP on resuming Abilify.  Insomnia (Goal: Improve sleep) -Not ideally controlled -Current treatment  Diazepam 5mg  q12h prn anxiety -Medications previously tried: trazodone -Sleep is off and on  -Recommended to continue current medication Possible untreated depression leading to sleep disturbances.  Patient Goals/Self-Care Activities Patient will:  - take medications as prescribed as evidenced by patient report and record review check blood pressure as able, document, and provide at future appointments weigh daily, and contact provider if weight gain of 3 lbs in one day or 5 lbs in one week  Follow Up Plan: The care management team will reach out to the patient again over the next 180 days.      Ms. Arnaud was given information about Chronic Care Management services today including:  CCM service includes personalized support from designated clinical staff supervised by her physician, including individualized plan of care and coordination with other care providers 24/7 contact  phone numbers for assistance for urgent and routine care needs. Standard insurance, coinsurance, copays and deductibles apply for chronic care management only during months in which we provide at least 20 minutes of these services. Most insurances cover these services at 100%, however patients may be responsible for any copay, coinsurance and/or deductible if applicable. This service may help you avoid the need for more expensive face-to-face services. Only one practitioner may furnish and bill the service in a calendar month. The patient may stop CCM services at any time (effective at the end of the month) by phone call to the office staff.  Patient agreed to services and verbal consent obtained.   The patient verbalized understanding of instructions, educational materials, and care plan provided today and agreed to receive a mailed copy of patient instructions, educational materials, and care plan.  Telephone follow up appointment with pharmacy team member scheduled for: 6 months  Edythe Clarity, [place], PharmD Clinical Pharmacist  [employer] [phone]

## 2021-05-05 ENCOUNTER — Other Ambulatory Visit: Payer: Self-pay | Admitting: Internal Medicine

## 2021-05-05 ENCOUNTER — Other Ambulatory Visit: Payer: Self-pay | Admitting: Family Medicine

## 2021-05-13 ENCOUNTER — Other Ambulatory Visit: Payer: Self-pay | Admitting: Cardiology

## 2021-05-13 ENCOUNTER — Other Ambulatory Visit: Payer: Self-pay | Admitting: Family Medicine

## 2021-05-20 ENCOUNTER — Ambulatory Visit (INDEPENDENT_AMBULATORY_CARE_PROVIDER_SITE_OTHER): Payer: Medicare Other | Admitting: Cardiology

## 2021-05-20 ENCOUNTER — Encounter: Payer: Self-pay | Admitting: Cardiology

## 2021-05-20 ENCOUNTER — Other Ambulatory Visit: Payer: Self-pay

## 2021-05-20 DIAGNOSIS — E7800 Pure hypercholesterolemia, unspecified: Secondary | ICD-10-CM

## 2021-05-20 DIAGNOSIS — I639 Cerebral infarction, unspecified: Secondary | ICD-10-CM | POA: Diagnosis not present

## 2021-05-20 DIAGNOSIS — I10 Essential (primary) hypertension: Secondary | ICD-10-CM

## 2021-05-20 DIAGNOSIS — Z95810 Presence of automatic (implantable) cardiac defibrillator: Secondary | ICD-10-CM

## 2021-05-20 DIAGNOSIS — I5022 Chronic systolic (congestive) heart failure: Secondary | ICD-10-CM
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
## 2021-05-20 NOTE — Assessment & Plan Note (Signed)
Overall well controlled on current regimen.  Medication management continued.

## 2021-05-20 NOTE — Assessment & Plan Note (Signed)
EF now 45 to 50%.  Carvedilol 12.5 twice a day, Entresto low-dose 24/26.  Overall doing quite well.  Also has furosemide 40 mg a day.  Well compensated, ICD in place.

## 2021-05-20 NOTE — Assessment & Plan Note (Signed)
Functioning well.  No changes made.  Dr. Ladona Ridgel.  AV node ablation.

## 2021-05-20 NOTE — Assessment & Plan Note (Signed)
Was on Plavix and aspirin, now on Plavix monotherapy.  Neurology notes reviewed.

## 2021-05-20 NOTE — Patient Instructions (Signed)
Medication Instructions:  The current medical regimen is effective;  continue present plan and medications.  *If you need a refill on your cardiac medications before your next appointment, please call your pharmacy*  You have been referred to our Lipid Clinic here at this office.  Follow-Up: At [facility], you and your health needs are our priority.  As part of our continuing mission to provide you with exceptional heart care, we have created designated Provider Care Teams.  These Care Teams include your primary Cardiologist (physician) and Advanced Practice Providers (APPs -  Physician Assistants and Nurse Practitioners) who all work together to provide you with the care you need, when you need it.  We recommend signing up for the patient portal called "MyChart".  Sign up information is provided on this After Visit Summary.  MyChart is used to connect with patients for Virtual Visits (Telemedicine).  Patients are able to view lab/test results, encounter notes, upcoming appointments, etc.  Non-urgent messages can be sent to your provider as well.   To learn more about what you can do with MyChart, go to [email or web address].    Your next appointment:   6 month(s)  The format for your next appointment:   In Person  Provider:  Any NP/APP.    Thank you for choosing [facility]!!

## 2021-05-20 NOTE — Assessment & Plan Note (Signed)
LDL goal should be less than 55 given her prior stroke age 66 as well as hypertension and history of heart failure.  Last LDL 123.  She is on atorvastatin 40 mg a day.  We will go ahead and refer her for Repatha.

## 2021-05-23 ENCOUNTER — Telehealth: Payer: Self-pay | Admitting: *Deleted

## 2021-05-23 ENCOUNTER — Other Ambulatory Visit: Payer: Self-pay

## 2021-05-23 ENCOUNTER — Ambulatory Visit (INDEPENDENT_AMBULATORY_CARE_PROVIDER_SITE_OTHER): Payer: Medicare Other

## 2021-05-23 DIAGNOSIS — E2839 Other primary ovarian failure: Secondary | ICD-10-CM | POA: Diagnosis not present

## 2021-05-23 DIAGNOSIS — Z0000 Encounter for general adult medical examination without abnormal findings: Secondary | ICD-10-CM

## 2021-05-23 MED ORDER — NITROGLYCERIN 0.4 MG SL SUBL: SUBLINGUAL_TABLET | SUBLINGUAL | 4 refills | Status: DC

## 2021-05-23 NOTE — Telephone Encounter (Signed)
[facility] is requesting a refill for Nitroglycerin 0.4 mg SUB. Patient last received medication in 2020 from Norma Fredrickson. Please advise. Thank you

## 2021-05-23 NOTE — Patient Instructions (Signed)
Anita Diaz , Thank you for taking time to come for your Medicare Wellness Visit. I appreciate your ongoing commitment to your health goals. Please review the following plan we discussed and let me know if I can assist you in the future.   Screening recommendations/referrals: Colonoscopy: Done 02/23/12 repeat every 10 years  Mammogram: Done 06/14/20 repeat every year Bone Density: order place 05/23/21 Recommended yearly ophthalmology/optometry visit for glaucoma screening and checkup Recommended yearly dental visit for hygiene and checkup  Vaccinations: Influenza vaccine: Discontinued Pneumococcal vaccine: Declined  Tdap vaccine: Discontinued  Shingles vaccine: Shingrix discussed. Please contact your pharmacy for coverage information.    Covid-19:Declined   Advanced directives: Advance directive discussed with you today. I have provided a copy for you to complete at home and have notarized. Once this is complete please bring a copy in to our office so we can scan it into your chart.  Conditions/risks identified: Lose weight   Next appointment: Follow up in one year for your annual wellness visit    Preventive Care 65 Years and Older, Female Preventive care refers to lifestyle choices and visits with your health care provider that can promote health and wellness. What does preventive care include? A yearly physical exam. This is also called an annual well check. Dental exams once or twice a year. Routine eye exams. Ask your health care provider how often you should have your eyes checked. Personal lifestyle choices, including: Daily care of your teeth and gums. Regular physical activity. Eating a healthy diet. Avoiding tobacco and drug use. Limiting alcohol use. Practicing safe sex. Taking low-dose aspirin every day. Taking vitamin and mineral supplements as recommended by your health care provider. What happens during an annual well check? The services and screenings done by your  health care provider during your annual well check will depend on your age, overall health, lifestyle risk factors, and family history of disease. Counseling  Your health care provider may ask you questions about your: Alcohol use. Tobacco use. Drug use. Emotional well-being. Home and relationship well-being. Sexual activity. Eating habits. History of falls. Memory and ability to understand (cognition). Work and work Astronomer. Reproductive health. Screening  You may have the following tests or measurements: Height, weight, and BMI. Blood pressure. Lipid and cholesterol levels. These may be checked every 5 years, or more frequently if you are over 13 years old. Skin check. Lung cancer screening. You may have this screening every year starting at age 72 if you have a 30-pack-year history of smoking and currently smoke or have quit within the past 15 years. Fecal occult blood test (FOBT) of the stool. You may have this test every year starting at age 22. Flexible sigmoidoscopy or colonoscopy. You may have a sigmoidoscopy every 5 years or a colonoscopy every 10 years starting at age 70. Hepatitis C blood test. Hepatitis B blood test. Sexually transmitted disease (STD) testing. Diabetes screening. This is done by checking your blood sugar (glucose) after you have not eaten for a while (fasting). You may have this done every 1-3 years. Bone density scan. This is done to screen for osteoporosis. You may have this done starting at age 19. Mammogram. This may be done every 1-2 years. Talk to your health care provider about how often you should have regular mammograms. Talk with your health care provider about your test results, treatment options, and if necessary, the need for more tests. Vaccines  Your health care provider may recommend certain vaccines, such as: Influenza vaccine. This  is recommended every year. Tetanus, diphtheria, and acellular pertussis (Tdap, Td) vaccine. You may  need a Td booster every 10 years. Zoster vaccine. You may need this after age 33. Pneumococcal 13-valent conjugate (PCV13) vaccine. One dose is recommended after age 66. Pneumococcal polysaccharide (PPSV23) vaccine. One dose is recommended after age 60. Talk to your health care provider about which screenings and vaccines you need and how often you need them. This information is not intended to replace advice given to you by your health care provider. Make sure you discuss any questions you have with your health care provider. Document Released: 05/07/2015 Document Revised: 12/29/2015 Document Reviewed: 02/09/2015 Elsevier Interactive Patient Education  2017 [place] Prevention in the Home Falls can cause injuries. They can happen to people of all ages. There are many things you can do to make your home safe and to help prevent falls. What can I do on the outside of my home? Regularly fix the edges of walkways and driveways and fix any cracks. Remove anything that might make you trip as you walk through a door, such as a raised step or threshold. Trim any bushes or trees on the path to your home. Use bright outdoor lighting. Clear any walking paths of anything that might make someone trip, such as rocks or tools. Regularly check to see if handrails are loose or broken. Make sure that both sides of any steps have handrails. Any raised decks and porches should have guardrails on the edges. Have any leaves, snow, or ice cleared regularly. Use sand or salt on walking paths during winter. Clean up any spills in your garage right away. This includes oil or grease spills. What can I do in the bathroom? Use night lights. Install grab bars by the toilet and in the tub and shower. Do not use towel bars as grab bars. Use non-skid mats or decals in the tub or shower. If you need to sit down in the shower, use a plastic, non-slip stool. Keep the floor dry. Clean up any water that spills on  the floor as soon as it happens. Remove soap buildup in the tub or shower regularly. Attach bath mats securely with double-sided non-slip rug tape. Do not have throw rugs and other things on the floor that can make you trip. What can I do in the bedroom? Use night lights. Make sure that you have a light by your bed that is easy to reach. Do not use any sheets or blankets that are too big for your bed. They should not hang down onto the floor. Have a firm chair that has side arms. You can use this for support while you get dressed. Do not have throw rugs and other things on the floor that can make you trip. What can I do in the kitchen? Clean up any spills right away. Avoid walking on wet floors. Keep items that you use a lot in easy-to-reach places. If you need to reach something above you, use a strong step stool that has a grab bar. Keep electrical cords out of the way. Do not use floor polish or wax that makes floors slippery. If you must use wax, use non-skid floor wax. Do not have throw rugs and other things on the floor that can make you trip. What can I do with my stairs? Do not leave any items on the stairs. Make sure that there are handrails on both sides of the stairs and use them. Fix handrails that  are broken or loose. Make sure that handrails are as long as the stairways. Check any carpeting to make sure that it is firmly attached to the stairs. Fix any carpet that is loose or worn. Avoid having throw rugs at the top or bottom of the stairs. If you do have throw rugs, attach them to the floor with carpet tape. Make sure that you have a light switch at the top of the stairs and the bottom of the stairs. If you do not have them, ask someone to add them for you. What else can I do to help prevent falls? Wear shoes that: Do not have high heels. Have rubber bottoms. Are comfortable and fit you well. Are closed at the toe. Do not wear sandals. If you use a stepladder: Make sure  that it is fully opened. Do not climb a closed stepladder. Make sure that both sides of the stepladder are locked into place. Ask someone to hold it for you, if possible. Clearly mark and make sure that you can see: Any grab bars or handrails. First and last steps. Where the edge of each step is. Use tools that help you move around (mobility aids) if they are needed. These include: Canes. Walkers. Scooters. Crutches. Turn on the lights when you go into a dark area. Replace any light bulbs as soon as they burn out. Set up your furniture so you have a clear path. Avoid moving your furniture around. If any of your floors are uneven, fix them. If there are any pets around you, be aware of where they are. Review your medicines with your doctor. Some medicines can make you feel dizzy. This can increase your chance of falling. Ask your doctor what other things that you can do to help prevent falls. This information is not intended to replace advice given to you by your health care provider. Make sure you discuss any questions you have with your health care provider. Document Released: 02/04/2009 Document Revised: 09/16/2015 Document Reviewed: 05/15/2014 Elsevier Interactive Patient Education  2017 [employer].

## 2021-05-23 NOTE — Telephone Encounter (Signed)
Refill for SL Ntg sent into pharmacy as requested.  Pt recently seen in office by Dr Anne Fu.
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
## 2021-05-24 ENCOUNTER — Telehealth: Payer: Self-pay

## 2021-05-24 DIAGNOSIS — E7800 Pure hypercholesterolemia, unspecified: Secondary | ICD-10-CM

## 2021-05-24 DIAGNOSIS — I10 Essential (primary) hypertension: Secondary | ICD-10-CM

## 2021-05-24 DIAGNOSIS — F321 Major depressive disorder, single episode, moderate: Secondary | ICD-10-CM | POA: Diagnosis not present

## 2021-05-24 DIAGNOSIS — I5020 Unspecified systolic (congestive) heart failure: Secondary | ICD-10-CM | POA: Diagnosis not present

## 2021-05-24 NOTE — Telephone Encounter (Signed)
**Note De-Identified  Obfuscation** The pt left her completed [employer] pt asst application for Entresto at the office with documents.  I have completed the providers page of her application and have e-mailed all to Dr Anne Fu nurse so she can print a Entresto 24-26 mg RX for # 180 with 3 refills, have Dr Anne Fu sign and date both the RX and the application, and to fax all to [employer] pt asst [employer] at the fax number written on the cover letter included or to place in Medical Records to be faxed.

## 2021-05-25 MED ORDER — ENTRESTO 24-26 MG PO TABS: 1.0000 | ORAL_TABLET | Freq: Two times a day (BID) | ORAL | 3 refills | Status: DC

## 2021-05-25 NOTE — Telephone Encounter (Signed)
Received email of pt application for pt assistance for Entresto.  Printed.  Printed RX for medication.  Given to Dr Anne Fu to sign.

## 2021-05-25 NOTE — Telephone Encounter (Signed)
Faxed to # on cover sheet and received confirmation.

## 2021-05-31 NOTE — Telephone Encounter (Signed)
**Note De-Identified  Obfuscation** Letter received from [employer] pt asst [employer] stating that the pt did not indicate how many people live in her household and that they need her proof of income.  I called [employer] and s/w Alvenia who advised me that they do have the pts proof of income but that it is invalid without the pts household size. Alvenia stated that the pt can call them to provide her household size so they can process her application.  I called the pt and made her aware of this and provided her [employer] pt asst's phone number. She states that she will call them today or tomorrow as she is not currently at home. She was very appreciative of my call.

## 2021-06-06 ENCOUNTER — Other Ambulatory Visit: Payer: Self-pay | Admitting: Family Medicine

## 2021-06-06 ENCOUNTER — Other Ambulatory Visit: Payer: Self-pay | Admitting: Internal Medicine

## 2021-06-06 DIAGNOSIS — E2839 Other primary ovarian failure: Secondary | ICD-10-CM

## 2021-06-06 NOTE — Telephone Encounter (Signed)
**Note De-Identified Fox Salminen Obfuscation** Letter from [employer] received [place] fax stating that they have approved the pt for Entresto asst until 04/23/2022. Pt ID: [id number]  The letter states that they have notified the pt of this approval as well.

## 2021-06-07 ENCOUNTER — Telehealth: Payer: Self-pay | Admitting: *Deleted

## 2021-06-07 NOTE — Telephone Encounter (Signed)
Approvedtoday Effective from 06/07/2021 through 06/07/2022. Pharmacy notified

## 2021-06-07 NOTE — Telephone Encounter (Signed)
[name] - Rx #: [id number] Status Sent to Plan today Drug diazePAM 5MG  tablets Waiting for determination

## 2021-06-08 ENCOUNTER — Other Ambulatory Visit: Payer: Self-pay

## 2021-06-08 ENCOUNTER — Ambulatory Visit
Admission: RE | Admit: 2021-06-08 | Discharge: 2021-06-08 | Disposition: A | Discharge status: Home / Self Care | Payer: Medicare Other | Source: Ambulatory Visit | Attending: Family Medicine | Admitting: Family Medicine

## 2021-06-08 ENCOUNTER — Ambulatory Visit: Payer: Medicare Other | Admitting: Pharmacist

## 2021-06-08 DIAGNOSIS — E7800 Pure hypercholesterolemia, unspecified: Secondary | ICD-10-CM

## 2021-06-08 DIAGNOSIS — M85852 Other specified disorders of bone density and structure, left thigh: Secondary | ICD-10-CM | POA: Diagnosis not present

## 2021-06-08 DIAGNOSIS — E2839 Other primary ovarian failure: Secondary | ICD-10-CM

## 2021-06-08 DIAGNOSIS — Z780 Asymptomatic menopausal state: Secondary | ICD-10-CM | POA: Diagnosis not present

## 2021-06-08 NOTE — Patient Instructions (Addendum)
It was nice to meet you today!  Your LDL cholesterol last fall was 123, your goal is < 55  We will recheck updated labs today  Depending on the results, we may increase the dose of your atorvastatin or add on a new medication like Zetia

## 2021-06-08 NOTE — Progress Notes (Signed)
Patient ID: Anita Diaz                 DOB: Mar 07, 1956                    MRN: [id number]     HPI: Anita Diaz is a 66 y.o. female patient referred to lipid clinic by Dr Marlou Porch. 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 Presented to the ER 12/2020 with acute encephalopathy due to stroke (punctate right hippocampus infarct).  Pt presents today for follow up. Has been taking her atorvastatin since it was prescribed last fall after her stroke. Tolerating meds well, denies adverse effects. Has not had repeat labs checked since starting her statin. Is receiving Entresto through PAP for 2023 year.  Current Medications: atorvastatin 40mg  daily Risk Factors: premature ASCVD - stroke, HTN, CHF LDL goal: 55mg /dL  Diet: Skips breakfast. Lunch - sandwich on grain bread. Dinner - chicken, fish. Avoids red meat. Snacks on popcorn or pretzels. 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 Father with CABG x4.  Social History: Former smoker 2 PPD for 37 years, occasional alcohol use, denies drug use.  Labs: 01/12/21: TC 209, TG 163, HDL 53, LDL 123 (no LLT)  Past Medical History:  Diagnosis Date   Bipolar 1 disorder ([place])    Chest pain at rest 06/29/2017   Chronic pain    Depression    Depression, major, single episode, moderate (HCC)    Heart failure with reduced ejection fraction ([place]) 08/01/2017   Hyperlipidemia    Hypertension    Insomnia    Neck pain 08/29/2017   Osteoarthritis, hand 08/01/2017   Other fatigue 08/29/2017   Pacemaker 11/08/2017   TIA (transient ischemic attack) 12/2020    Current Outpatient Medications on File Prior to Visit  Medication Sig Dispense Refill   atorvastatin (LIPITOR) 40 MG tablet Take 1 tablet (40 mg total) by  mouth daily. 30 tablet 3   carvedilol (COREG) 12.5 MG tablet TAKE 1 & 1/2 (ONE & ONE-HALF) TABLETS BY MOUTH TWICE DAILY 270 tablet 2   clopidogrel (PLAVIX) 75 MG tablet Take 1 tablet by mouth once daily 30 tablet 0   diazepam (VALIUM) 5 MG tablet TAKE 1 TABLET BY MOUTH EVERY 12 HOURS AS NEEDED FOR ANXIETY 30 tablet 0   FLUoxetine (PROZAC) 40 MG capsule Take 1 capsule by mouth once daily 90 capsule 0   furosemide (LASIX) 40 MG tablet TAKE 1 TABLET BY MOUTH ONCE DAILY . APPOINTMENT REQUIRED FOR FUTURE REFILLS 30 tablet 11   levocetirizine (XYZAL) 5 MG tablet Take 5 mg by mouth every evening.     loperamide (IMODIUM A-D) 2 MG tablet Take 4 mg by mouth daily.     nitroGLYCERIN (NITROSTAT) 0.4 MG SL tablet PLACE 1 TABLET UNDER THE TONGUE EVERY 5 MINUTES AS NEEDED FOR CHEST PAIN 25 tablet 4   sacubitril-valsartan (ENTRESTO) 24-26 MG Take 1 tablet by mouth 2 (two) times daily. 180 tablet 3   No current facility-administered medications on file prior to visit.    Allergies  Allergen Reactions   Sulfa Antibiotics Other (See Comments)    Childhood allergy    Assessment/Plan:  1. Hyperlipidemia - Baseline LDL 123 above goal < 55 given premature ASCVD. Checking  lipids today since pt started atorvastatin 40mg  daily last fall. Added on direct LDL since pt is not fasting (had a sandwich and regular soda about 4-5 hours ago). Can increase the dose or add ezetimibe if needed pending lab results. Will call pt tomorrow to discuss.  Jumar Greenstreet E. Jamaris Biernat, PharmD, BCACP, [place] [id number] [place], [place] [address] Phone: [phone]; Fax: [phone] 06/08/2021 4:03 PM

## 2021-06-09 ENCOUNTER — Ambulatory Visit (INDEPENDENT_AMBULATORY_CARE_PROVIDER_SITE_OTHER): Payer: Medicare Other

## 2021-06-09 ENCOUNTER — Telehealth: Payer: Self-pay | Admitting: Pharmacist

## 2021-06-09 DIAGNOSIS — I5022 Chronic systolic (congestive) heart failure: Secondary | ICD-10-CM

## 2021-06-09 DIAGNOSIS — E7800 Pure hypercholesterolemia, unspecified: Secondary | ICD-10-CM

## 2021-06-09 LAB — CUP PACEART REMOTE DEVICE CHECK
Battery Remaining Longevity: 114 mo
Battery Remaining Percentage: 100 %
Brady Statistic RA Percent Paced: 9 %
Brady Statistic RV Percent Paced: 2 %
Date Time Interrogation Session: 20230216044100
HighPow Impedance: 70 Ohm
Implantable Lead Implant Date: 20190718
Implantable Lead Implant Date: 20190718
Implantable Lead Implant Date: 20190718
Implantable Lead Location: 753858
Implantable Lead Location: 753859
Implantable Lead Location: 753860
Implantable Lead Model: 292
Implantable Lead Model: 4671
Implantable Lead Model: 7740
Implantable Lead Serial Number: 447121
Implantable Lead Serial Number: 694576
Implantable Lead Serial Number: 815914
Implantable Pulse Generator Implant Date: 20190718
Lead Channel Impedance Value: 441 Ohm
Lead Channel Impedance Value: 461 Ohm
Lead Channel Impedance Value: 659 Ohm
Lead Channel Setting Pacing Amplitude: 2 V
Lead Channel Setting Pacing Amplitude: 2.4 V
Lead Channel Setting Pacing Amplitude: 2.6 V
Lead Channel Setting Pacing Pulse Width: 0.4 ms
Lead Channel Setting Pacing Pulse Width: 0.4 ms
Lead Channel Setting Sensing Sensitivity: 0.5 mV
Lead Channel Setting Sensing Sensitivity: 1 mV
Pulse Gen Serial Number: 215046

## 2021-06-09 LAB — LIPID PANEL
Chol/HDL Ratio: 2.9 ratio (ref 0.0–4.4)
Cholesterol, Total: 150 mg/dL (ref 100–199)
HDL: 52 mg/dL (ref >39)
LDL Chol Calc (NIH): 68 mg/dL (ref 0–99)
Triglycerides: 183 mg/dL — ABNORMAL HIGH (ref 0–149)
VLDL Cholesterol Cal: 30 mg/dL (ref 5–40)

## 2021-06-09 LAB — HEPATIC FUNCTION PANEL
ALT: 25 IU/L (ref 0–32)
AST: 18 IU/L (ref 0–40)
Albumin: 4.7 g/dL (ref 3.8–4.8)
Alkaline Phosphatase: 85 IU/L (ref 44–121)
Bilirubin Total: 0.3 mg/dL (ref 0.0–1.2)
Bilirubin, Direct: 0.12 mg/dL (ref 0.00–0.40)
Total Protein: 7 g/dL (ref 6.0–8.5)

## 2021-06-09 LAB — LDL CHOLESTEROL, DIRECT: LDL Direct: 67 mg/dL (ref 0–99)

## 2021-06-09 MED ORDER — EZETIMIBE 10 MG PO TABS: 10.0000 mg | ORAL_TABLET | Freq: Every day | ORAL | 3 refills | Status: DC

## 2021-06-09 NOTE — Telephone Encounter (Signed)
LDL improved from 123 to 67 after starting atorvastatin 40mg  daily. LDL goal is < 55 given premature ASCVD. Increasing atorvastatin to 80mg  would only drop LDL an additional 6% and not bring her to goal, so instead will add on ezetimibe 10mg  daily for additional 20% LDL lowering and continue atorvastatin at 40mg  daily. Will recheck fasting labs in 2 months. Pt aware of lab results and in agreement with tx plan.

## 2021-06-10 ENCOUNTER — Encounter: Payer: Self-pay | Admitting: Family Medicine

## 2021-06-10 DIAGNOSIS — M8580 Other specified disorders of bone density and structure, unspecified site: Secondary | ICD-10-CM | POA: Insufficient documentation

## 2021-06-10 NOTE — Progress Notes (Signed)
Please inform patient of the following:  Her DEXA scan shows mild thinning of the bone called osteopenia.  Do not need to start any meds.  She should make sure that she is taking a calcium and vitamin D supplement.  She should aim for 1200mg  of calcium and 800IU of vitamin D daily. We can repeat in 2 years.

## 2021-06-15 NOTE — Progress Notes (Signed)
Remote ICD transmission.   

## 2021-07-11 ENCOUNTER — Other Ambulatory Visit: Payer: Self-pay | Admitting: Family Medicine

## 2021-07-29 ENCOUNTER — Telehealth: Payer: Self-pay | Admitting: Cardiology

## 2021-07-29 NOTE — Telephone Encounter (Signed)
Called and reviewed recommendations with pt.  She is currently using coricidin OTC.  Pt is aware to contact PCP for further evaluation if cough/cough s/s do not improve.   ?

## 2021-07-29 NOTE — Telephone Encounter (Signed)
Patient states she has a chest cold and would like to know what medications are safe for her to take for it. ?

## 2021-07-29 NOTE — Telephone Encounter (Signed)
She needs to avoid anything with pseudoephedrine or phenylephrine. (Ie any product that has D or PE on the end or contains these drugs) ? ?Ok to use dextromethorphan (Delysm), Mucinex or Mucinex DM ?

## 2021-07-31 DIAGNOSIS — J209 Acute bronchitis, unspecified: Secondary | ICD-10-CM | POA: Diagnosis not present

## 2021-07-31 DIAGNOSIS — R0600 Dyspnea, unspecified: Secondary | ICD-10-CM | POA: Diagnosis not present

## 2021-08-04 ENCOUNTER — Other Ambulatory Visit: Payer: Self-pay | Admitting: Family Medicine

## 2021-08-04 ENCOUNTER — Telehealth: Payer: Self-pay | Admitting: *Deleted

## 2021-08-04 DIAGNOSIS — Z1231 Encounter for screening mammogram for malignant neoplasm of breast: Secondary | ICD-10-CM

## 2021-08-04 MED ORDER — ATORVASTATIN CALCIUM 40 MG PO TABS: 40.0000 mg | ORAL_TABLET | Freq: Every day | ORAL | 1 refills | Status: DC

## 2021-08-04 NOTE — Telephone Encounter (Signed)
Refills

## 2021-08-05 ENCOUNTER — Ambulatory Visit (INDEPENDENT_AMBULATORY_CARE_PROVIDER_SITE_OTHER): Payer: Medicare Other | Admitting: Family Medicine

## 2021-08-05 ENCOUNTER — Encounter: Payer: Self-pay | Admitting: Family Medicine

## 2021-08-05 VITALS — BP 135/81 | HR 61 | Temp 98.3°F | Ht 62.0 in | Wt 145.8 lb

## 2021-08-05 DIAGNOSIS — J40 Bronchitis, not specified as acute or chronic: Secondary | ICD-10-CM

## 2021-08-05 MED ORDER — NEBULIZER MISC: 1.0000 | Freq: Once | 3 refills | Status: AC

## 2021-08-05 MED ORDER — CEFDINIR 300 MG PO CAPS: 300.0000 mg | ORAL_CAPSULE | Freq: Two times a day (BID) | ORAL | 0 refills | Status: DC

## 2021-08-05 MED ORDER — ALBUTEROL SULFATE (2.5 MG/3ML) 0.083% IN NEBU: 2.5000 mg | INHALATION_SOLUTION | Freq: Four times a day (QID) | RESPIRATORY_TRACT | 1 refills | Status: DC | PRN

## 2021-08-05 MED ORDER — PREDNISONE 20 MG PO TABS: 40.0000 mg | ORAL_TABLET | Freq: Every day | ORAL | 0 refills | Status: AC

## 2021-08-05 MED ORDER — BENZONATATE 100 MG PO CAPS: 100.0000 mg | ORAL_CAPSULE | Freq: Three times a day (TID) | ORAL | 0 refills | Status: DC | PRN

## 2021-08-05 MED ORDER — ALBUTEROL SULFATE (2.5 MG/3ML) 0.083% IN NEBU
2.5000 mg | INHALATION_SOLUTION | Freq: Once | RESPIRATORY_TRACT | Status: AC
  Administered 2021-08-05: 2.5 mg via RESPIRATORY_TRACT
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
## 2021-08-05 NOTE — Patient Instructions (Signed)
It was very nice to see you today! ? ?Meds sent to [facility] ?If worse, ER ? ? ?PLEASE NOTE: ? ?If you had any lab tests please let us know if you have not heard back within a few days. You may see your results on MyChart before we have a chance to review them but we will give you a call once they are reviewed by [place]. If we ordered any referrals today, please let us know if you have not heard from their office within the next week.  ? ?Please try these tips to maintain a healthy lifestyle: ? ?Eat most of your calories during the day when you are active. Eliminate processed foods including packaged sweets (pies, cakes, cookies), reduce intake of potatoes, white bread, white pasta, and white rice. Look for whole grain options, oat flour or almond flour. ? ?Each meal should contain half fruits/vegetables, one quarter protein, and one quarter carbs (no bigger than a computer mouse). ? ?Cut down on sweet beverages. This includes juice, soda, and sweet tea. Also watch fruit intake, though this is a healthier sweet option, it still contains natural sugar! Limit to 3 servings daily. ? ?Drink at least 1 glass of water with each meal and aim for at least 8 glasses per day ? ?Exercise at least 150 minutes every week.   ?

## 2021-08-08 ENCOUNTER — Other Ambulatory Visit: Payer: Medicare Other

## 2021-08-08 DIAGNOSIS — E7800 Pure hypercholesterolemia, unspecified: Secondary | ICD-10-CM

## 2021-08-08 LAB — HEPATIC FUNCTION PANEL
ALT: 50 IU/L — ABNORMAL HIGH (ref 0–32)
AST: 17 IU/L (ref 0–40)
Albumin: 4.5 g/dL (ref 3.8–4.8)
Alkaline Phosphatase: 73 IU/L (ref 44–121)
Bilirubin Total: 0.6 mg/dL (ref 0.0–1.2)
Bilirubin, Direct: 0.18 mg/dL (ref 0.00–0.40)
Total Protein: 6.5 g/dL (ref 6.0–8.5)

## 2021-08-08 LAB — LIPID PANEL
Chol/HDL Ratio: 1.8 ratio (ref 0.0–4.4)
Cholesterol, Total: 140 mg/dL (ref 100–199)
HDL: 77 mg/dL (ref >39)
LDL Chol Calc (NIH): 41 mg/dL (ref 0–99)
Triglycerides: 129 mg/dL (ref 0–149)
VLDL Cholesterol Cal: 22 mg/dL (ref 5–40)

## 2021-08-08 NOTE — Telephone Encounter (Signed)
Rx sent to pharmacy 08/04/21  ?

## 2021-08-08 NOTE — Telephone Encounter (Signed)
Ok with me. Please place any necessary orders. 

## 2021-08-10 ENCOUNTER — Ambulatory Visit
Admission: RE | Admit: 2021-08-10 | Discharge: 2021-08-10 | Disposition: A | Discharge status: Home / Self Care | Payer: Medicare Other | Source: Ambulatory Visit | Attending: Family Medicine | Admitting: Family Medicine

## 2021-08-10 DIAGNOSIS — Z1231 Encounter for screening mammogram for malignant neoplasm of breast: Secondary | ICD-10-CM

## 2021-08-10 IMAGING — MG MM DIGITAL SCREENING BILAT W/ TOMO AND CAD
6 of 10 series · 6 of 30 positions shown · non-contrast
Comparison: Previous exam(s).

CLINICAL DATA: Screening.

EXAM:
DIGITAL SCREENING BILATERAL MAMMOGRAM WITH TOMOSYNTHESIS AND CAD
TECHNIQUE: Bilateral screening digital craniocaudal and mediolateral oblique
mammograms were obtained. Bilateral screening digital breast
tomosynthesis was performed. The images were evaluated with
computer-aided detection.

[L MLO synth-2D (1 of 2)]
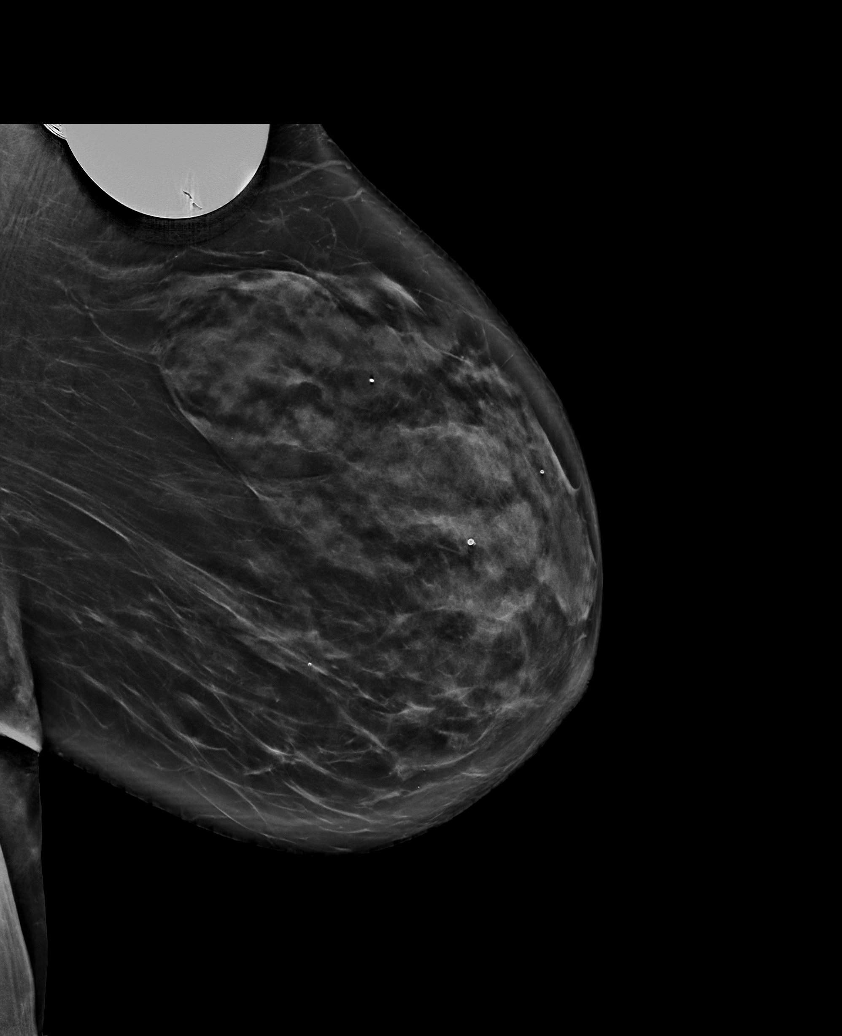

[R MLO synth-2D]
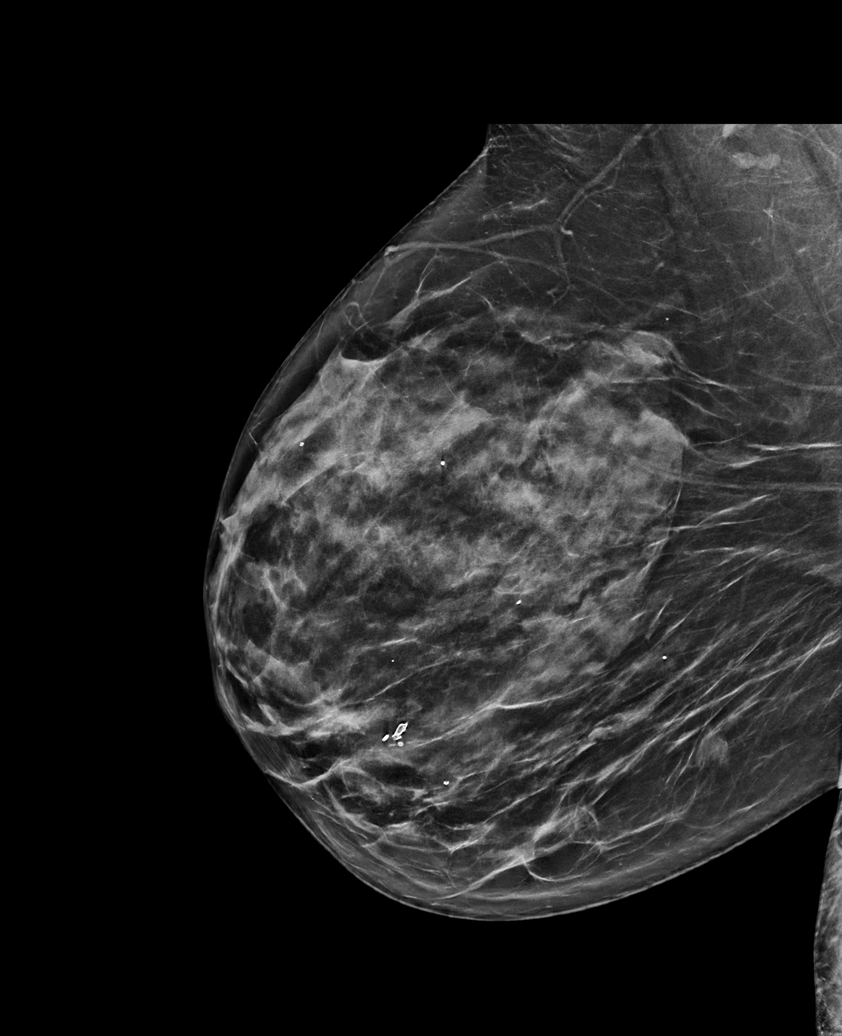

[L MLO synth-2D (2 of 2)]
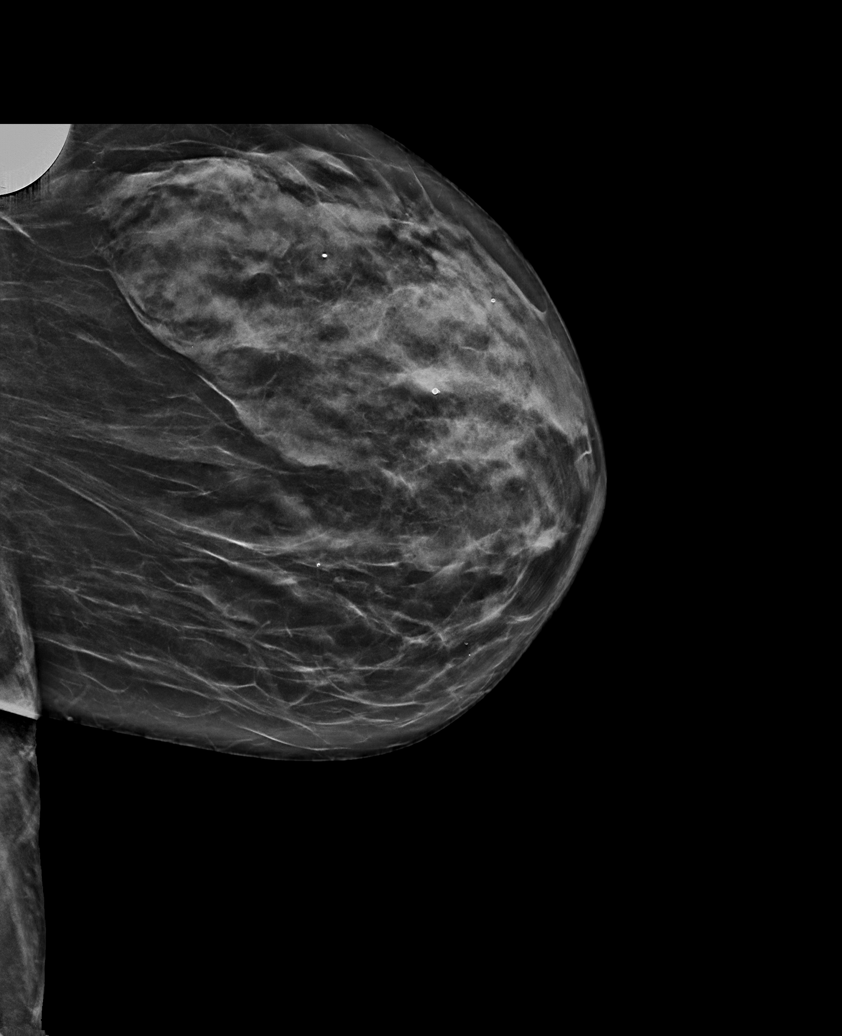

[L CC synth-2D]
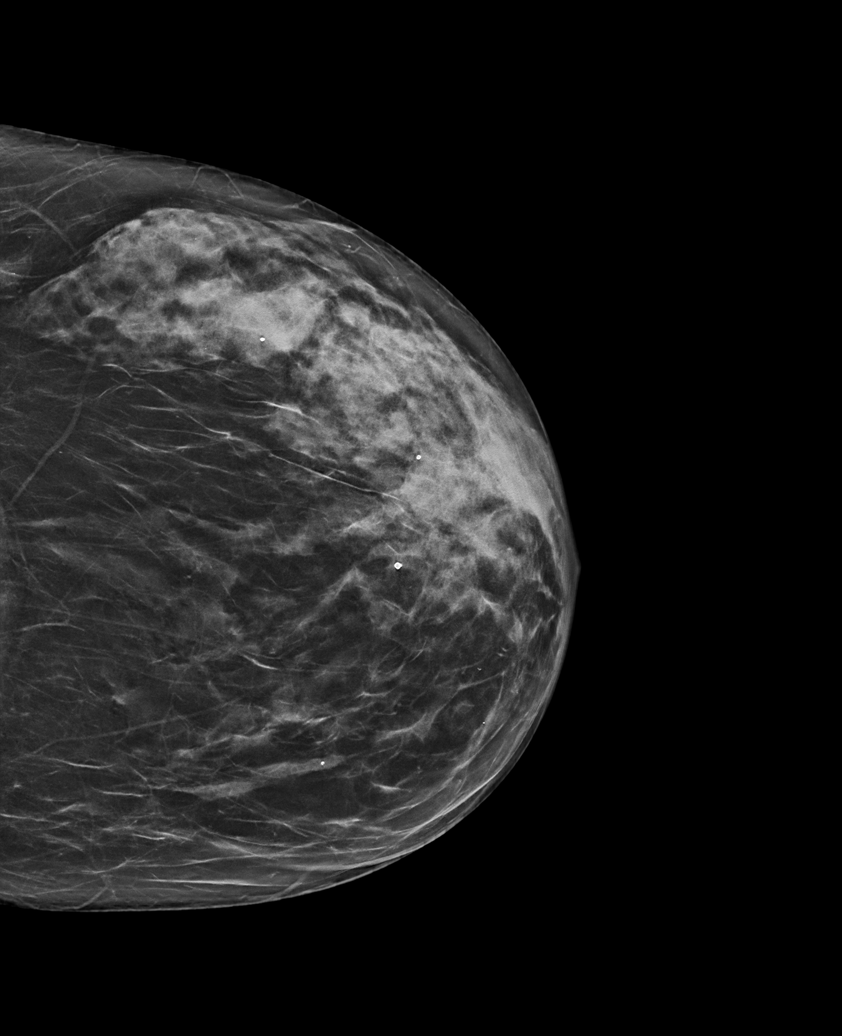

[R CC synth-2D]
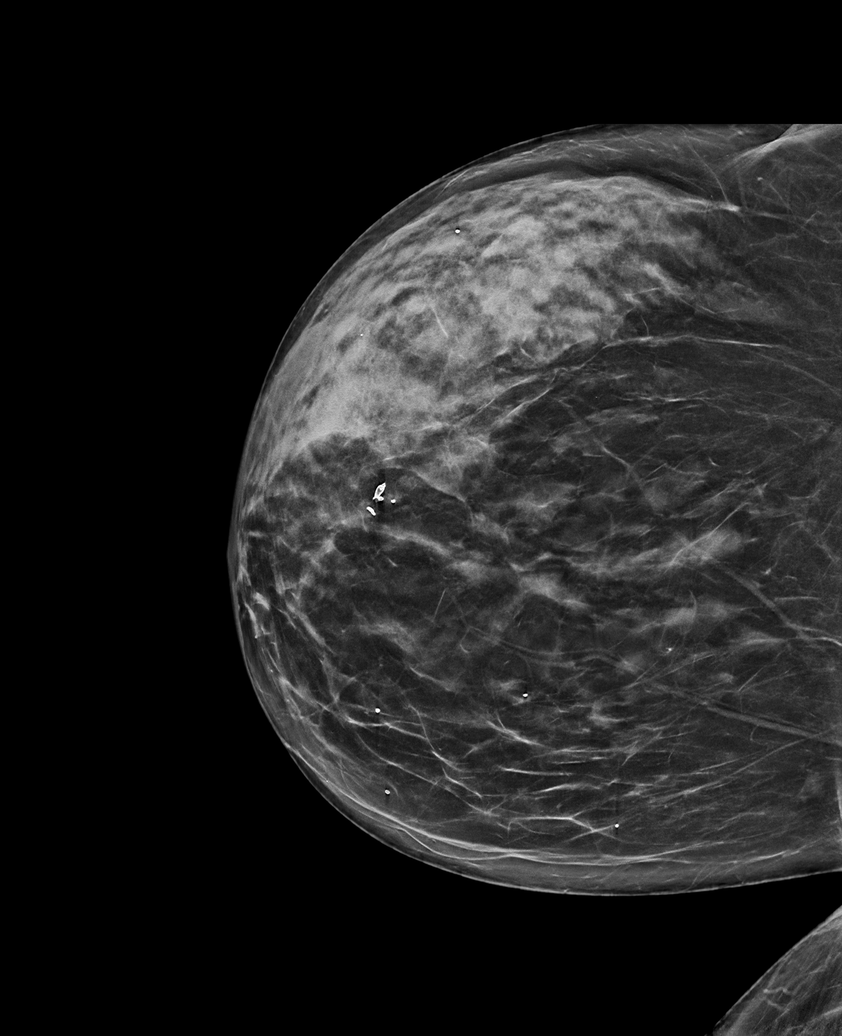

[L MLO tomo · tomo slice 36/71.0]
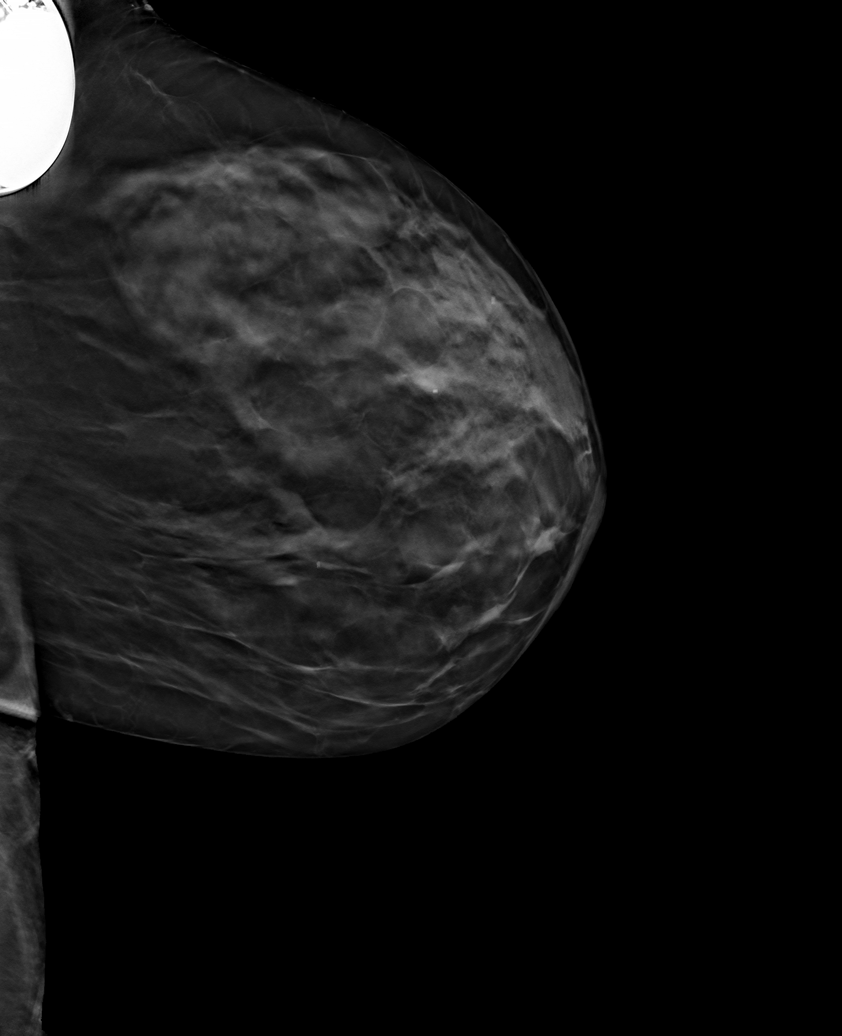

[6 of 30 positions shown; findings below may reference images not displayed]

ACR Breast Density Category c: The breast tissue is heterogeneously
dense, which may obscure small masses.
FINDINGS: There are no findings suspicious for malignancy.
IMPRESSION: No mammographic evidence of malignancy. A result letter of this
screening mammogram will be mailed directly to the patient.

RECOMMENDATION:
Screening mammogram in one year. (Code:[V2])

BI-RADS CATEGORY  1: Negative.

## 2021-08-12 ENCOUNTER — Telehealth: Payer: Self-pay | Admitting: Cardiology

## 2021-08-12 ENCOUNTER — Other Ambulatory Visit: Payer: Self-pay | Admitting: Family Medicine

## 2021-08-12 NOTE — Telephone Encounter (Signed)
Called pt back and relayed results of recent lab work with her. No questions or concerns at this time. ?

## 2021-08-12 NOTE — Telephone Encounter (Signed)
Patient is returning call regarding lab results

## 2021-08-17 ENCOUNTER — Encounter: Payer: Self-pay | Admitting: Family Medicine

## 2021-08-17 ENCOUNTER — Ambulatory Visit (INDEPENDENT_AMBULATORY_CARE_PROVIDER_SITE_OTHER)
Admission: RE | Admit: 2021-08-17 | Discharge: 2021-08-17 | Disposition: A | Discharge status: Home / Self Care | Payer: Medicare Other | Source: Ambulatory Visit | Attending: Family Medicine | Admitting: Family Medicine

## 2021-08-17 ENCOUNTER — Other Ambulatory Visit: Payer: Self-pay | Admitting: Family Medicine

## 2021-08-17 ENCOUNTER — Ambulatory Visit (INDEPENDENT_AMBULATORY_CARE_PROVIDER_SITE_OTHER): Payer: Medicare Other | Admitting: Family Medicine

## 2021-08-17 VITALS — BP 106/72 | HR 64 | Temp 98.1°F | Ht 62.0 in | Wt 146.0 lb

## 2021-08-17 DIAGNOSIS — I5020 Unspecified systolic (congestive) heart failure: Secondary | ICD-10-CM

## 2021-08-17 DIAGNOSIS — R059 Cough, unspecified: Secondary | ICD-10-CM

## 2021-08-17 DIAGNOSIS — I10 Essential (primary) hypertension: Secondary | ICD-10-CM | POA: Diagnosis not present

## 2021-08-17 IMAGING — DX DG CHEST 2V
2 series · 2 of 2 positions shown · non-contrast
Comparison: [DATE]

CLINICAL DATA: Cough congestion

EXAM:
CHEST - 2 VIEW

[chest pa]
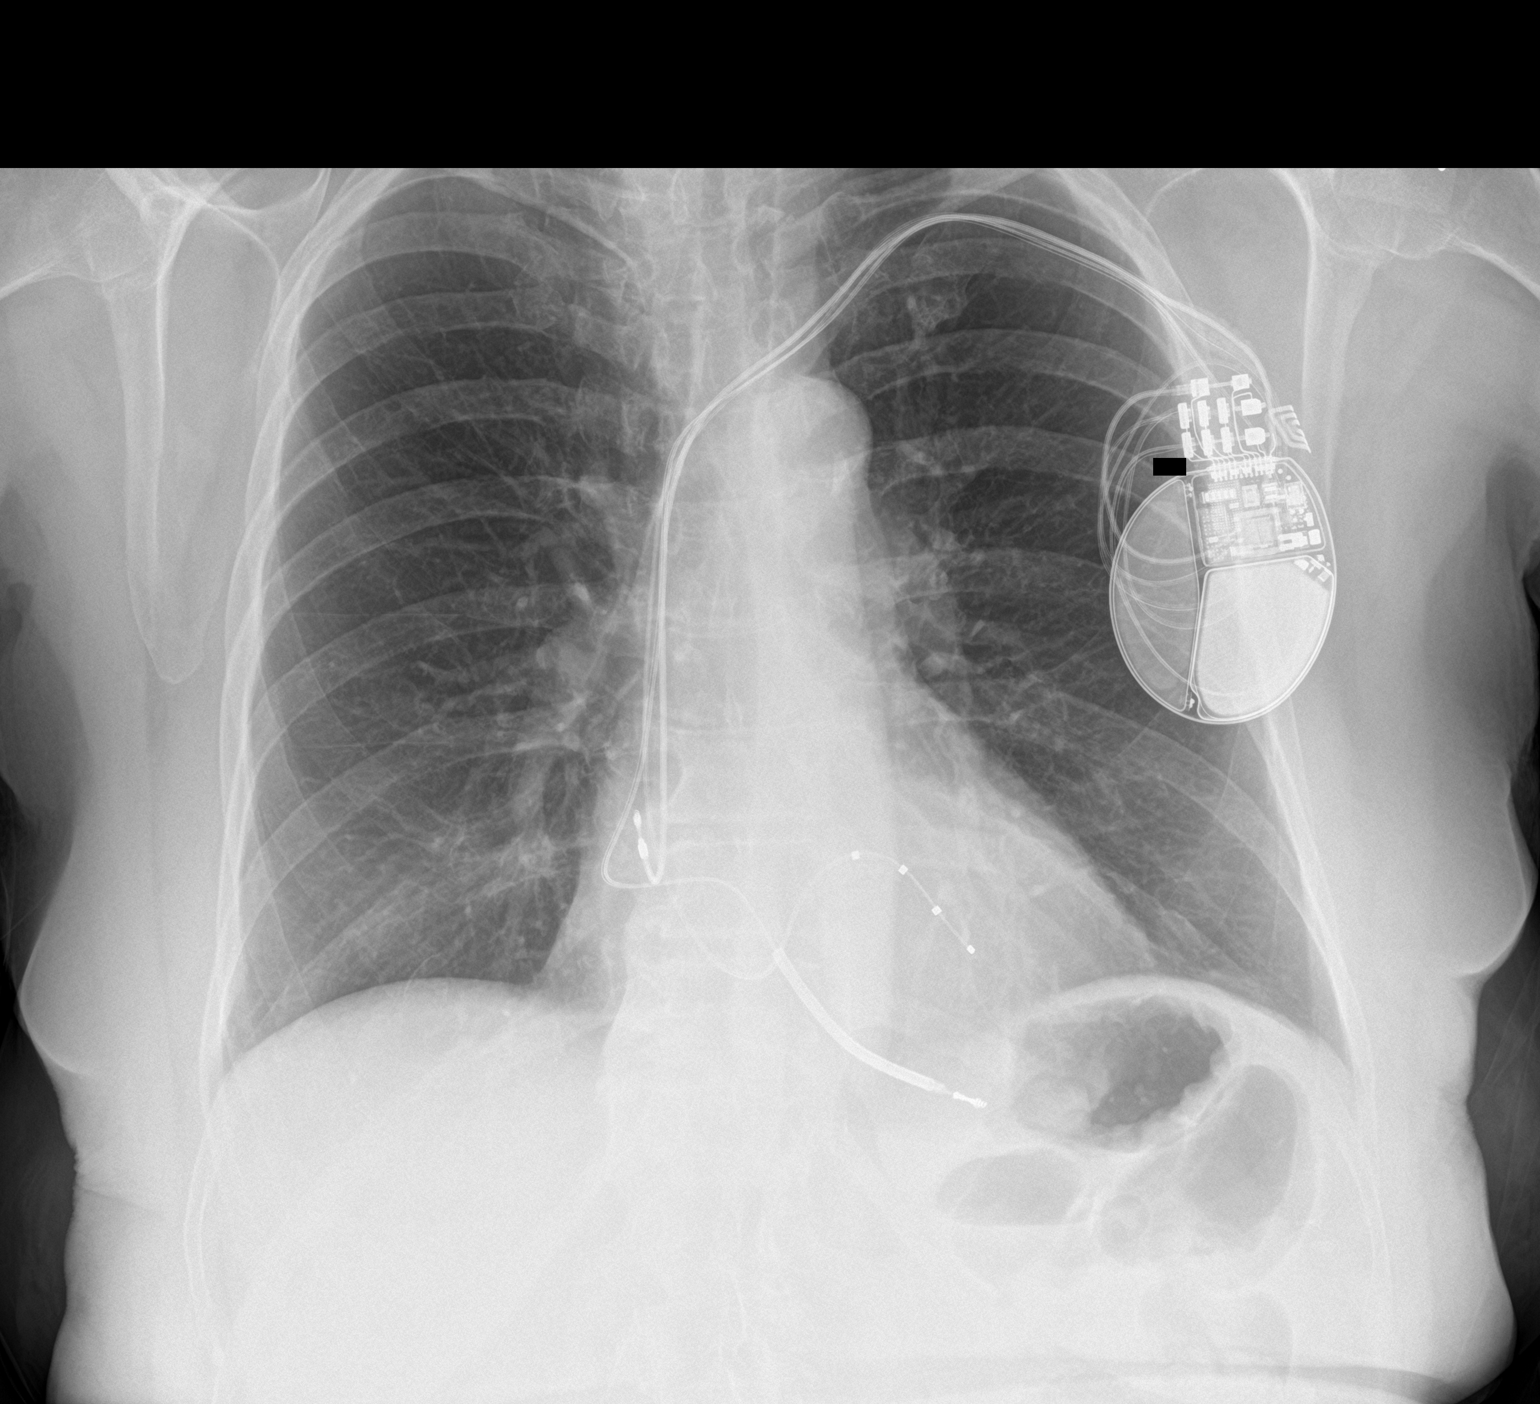

[chest lat]
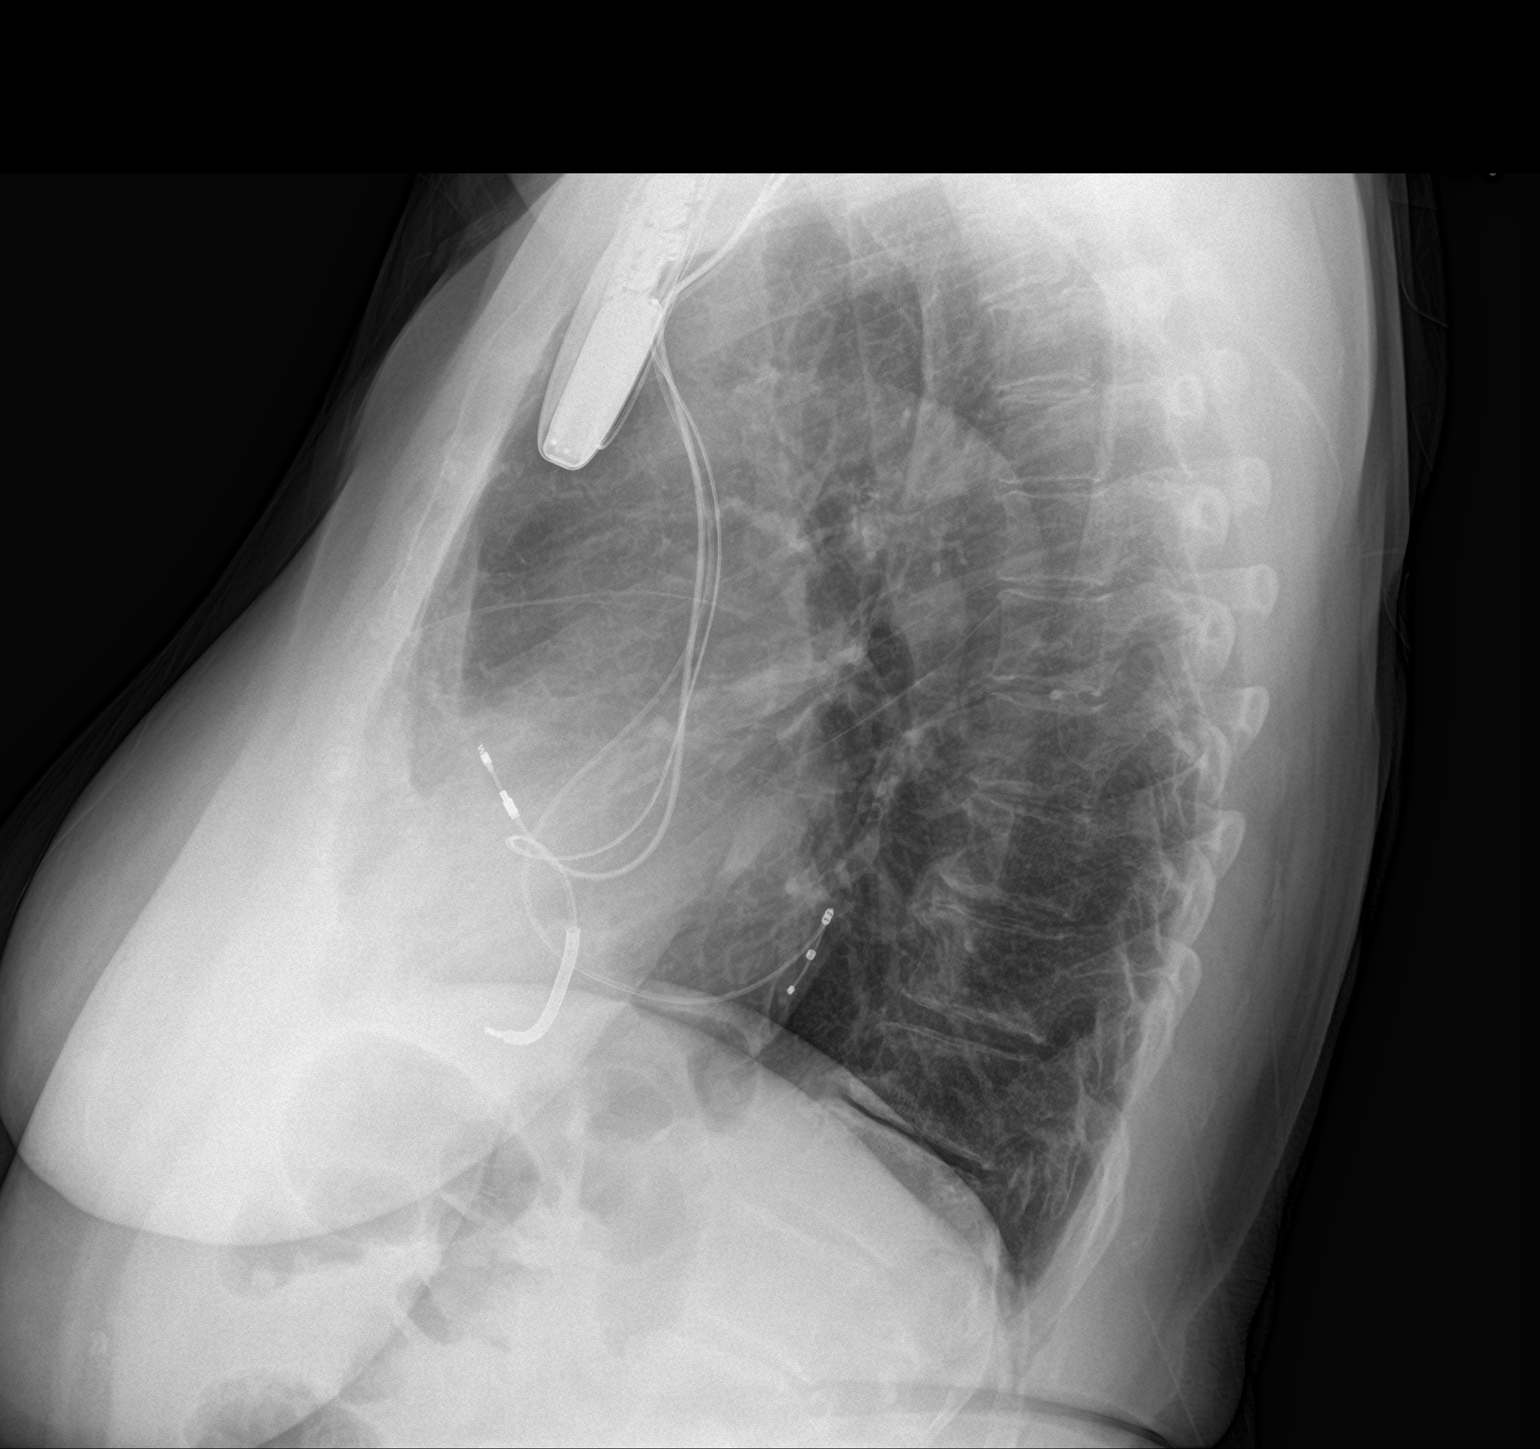

[2 of 2 positions shown; findings below may reference images not displayed]

FINDINGS: Left-sided multi lead pacing device as before. No focal opacity,
pleural effusion or pneumothorax. Normal cardiomediastinal
silhouette.
IMPRESSION: No active cardiopulmonary disease.

## 2021-08-17 MED ORDER — AZELASTINE HCL 0.1 % NA SOLN: 2.0000 | Freq: Two times a day (BID) | NASAL | 12 refills | Status: DC

## 2021-08-17 MED ORDER — PREDNISONE 50 MG PO TABS: ORAL_TABLET | ORAL | 0 refills | Status: DC

## 2021-08-17 MED ORDER — PANTOPRAZOLE SODIUM 40 MG PO TBEC: 40.0000 mg | DELAYED_RELEASE_TABLET | Freq: Every day | ORAL | 3 refills | Status: DC

## 2021-08-17 NOTE — Assessment & Plan Note (Addendum)
Stable today without any signs of volume overload.  Continue management per cardiology with Coreg 18.75 mg twice daily, Entresto 24-26 twice daily, and Lasix 40 mg daily. ?

## 2021-08-17 NOTE — Assessment & Plan Note (Signed)
At goal today on current regimen per cardiology. 

## 2021-08-17 NOTE — Patient Instructions (Signed)
It was very nice to see you today! ? ?We will get an x-ray today.  Please go to the [facility] office to have this done.  Please start the prednisone, nasal spray, and reflux medication. ? ?Send me a message next week on MyChart to let me know how things are going. ? ?Take care, ?Dr [name] ? ?PLEASE NOTE: ? ?If you had any lab tests please let us know if you have not heard back within a few days. You may see your results on mychart before we have a chance to review them but we will give you a call once they are reviewed by [place]. If we ordered any referrals today, please let us know if you have not heard from their office within the next week.  ? ?Please try these tips to maintain a healthy lifestyle: ? ?Eat at least 3 REAL meals and 1-2 snacks per day.  Aim for no more than 5 hours between eating.  If you eat breakfast, please do so within one hour of getting up.  ? ?Each meal should contain half fruits/vegetables, one quarter protein, and one quarter carbs (no bigger than a computer mouse) ? ?Cut down on sweet beverages. This includes juice, soda, and sweet tea.  ? ?Drink at least 1 glass of water with each meal and aim for at least 8 glasses per day ? ?Exercise at least 150 minutes every week.   ?

## 2021-08-17 NOTE — Progress Notes (Signed)
? ?  Anita Diaz is a 66 y.o. female who presents today for an office visit. ? ?Assessment/Plan:  ?New/Acute Problems: ?Cough ?Her lung exam today is clear without any wheezes or rhonchi and good airflow throughout.  However given length of her cough we will get x-ray to further evaluate.  She does have some postnasal drip as well as acid reflux both of which could be contributing.  We will treat with course of prednisone, Astelin, and Protonix to treat all of the above.  She does have a significant smoking history as well and may have underlying COPD.  She will follow-up with me next week.  If symptoms are not improving would consider referral to pulmonology and/or advanced imaging. ? ?Chronic Problems Addressed Today: ?Heart failure with reduced ejection fraction (HCC) ?Stable today without any signs of volume overload.  Continue management per cardiology with Coreg 18.75 mg twice daily, Entresto 24-26 twice daily, and Lasix 40 mg daily. ? ?Hypertension ?At goal today on current regimen per cardiology.  ? ? ?  ?Subjective:  ?HPI: ? ?Patient here with cough.  Symptoms started over a month ago.  Initially went to urgent care.  She was given prescription for prednisone and antibiotics.  Cough did not improve.  She was seen in this office 2 weeks ago by different provider.  She was given another round of prednisone in addition Omnicef, Tessalon, and albuterol.  Symptoms have improved slightly but are still persistent.  Cough is usually nonproductive though occasionally does have phlegm production.  She has occasional shortness of breath.  No fevers or chills.  Apparently he had an x-ray done in urgent care which did not show anything that she was aware of.  She does have occasional heartburn with reflux.  She has a lot of rhinorrhea.  No other specific treatments tried.  No known sick contacts. ? ? ?   ?  ?Objective:  ?Physical Exam: ?BP 106/72   Pulse 64   Temp 98.1 ?F (36.7 ?C) (Temporal)   Ht 5\' 2"  (1.575 m)    Wt 146 lb (66.2 kg)   SpO2 97%   BMI 26.70 kg/m?   ?Gen: No acute distress, resting comfortably ?CV: Regular rate and rhythm with no murmurs appreciated ?Pulm: Normal work of breathing, clear to auscultation bilaterally with no crackles, wheezes, or rhonchi frequent cough noted. ?Neuro: Grossly normal, moves all extremities ?Psych: Normal affect and thought content ? ?   ? ? . [name], MD ?08/17/2021 9:59 AM  ?

## 2021-08-18 ENCOUNTER — Other Ambulatory Visit: Payer: Self-pay

## 2021-08-18 MED ORDER — FUROSEMIDE 40 MG PO TABS: ORAL_TABLET | ORAL | 2 refills | Status: DC

## 2021-08-18 NOTE — Progress Notes (Signed)
Please inform patient of the following: ? ?Her chest xray is normal. Would like for her to let us know if her symptoms are not improving by next week. ? ?Algis Greenhouse. [name], MD ?08/18/2021 3:29 PM  ?

## 2021-08-19 ENCOUNTER — Other Ambulatory Visit: Payer: Self-pay | Admitting: *Deleted

## 2021-08-19 MED ORDER — CLOPIDOGREL BISULFATE 75 MG PO TABS: 75.0000 mg | ORAL_TABLET | Freq: Every day | ORAL | 0 refills | Status: DC

## 2021-08-31 ENCOUNTER — Other Ambulatory Visit: Payer: Medicare Other

## 2021-09-08 ENCOUNTER — Ambulatory Visit (INDEPENDENT_AMBULATORY_CARE_PROVIDER_SITE_OTHER): Payer: Medicare Other

## 2021-09-08 DIAGNOSIS — I428 Other cardiomyopathies: Secondary | ICD-10-CM

## 2021-09-14 LAB — CUP PACEART REMOTE DEVICE CHECK
Battery Remaining Longevity: 102 mo
Battery Remaining Percentage: 100 %
Brady Statistic RA Percent Paced: 9 %
Brady Statistic RV Percent Paced: 2 %
Date Time Interrogation Session: 20230519101500
HighPow Impedance: 74 Ohm
Implantable Lead Implant Date: 20190718
Implantable Lead Implant Date: 20190718
Implantable Lead Implant Date: 20190718
Implantable Lead Location: 753858
Implantable Lead Location: 753859
Implantable Lead Location: 753860
Implantable Lead Model: 292
Implantable Lead Model: 4671
Implantable Lead Model: 7740
Implantable Lead Serial Number: 447121
Implantable Lead Serial Number: 694576
Implantable Lead Serial Number: 815914
Implantable Pulse Generator Implant Date: 20190718
Lead Channel Impedance Value: 432 Ohm
Lead Channel Impedance Value: 478 Ohm
Lead Channel Impedance Value: 587 Ohm
Lead Channel Setting Pacing Amplitude: 2 V
Lead Channel Setting Pacing Amplitude: 2.4 V
Lead Channel Setting Pacing Amplitude: 2.6 V
Lead Channel Setting Pacing Pulse Width: 0.4 ms
Lead Channel Setting Pacing Pulse Width: 0.4 ms
Lead Channel Setting Sensing Sensitivity: 0.5 mV
Lead Channel Setting Sensing Sensitivity: 1 mV
Pulse Gen Serial Number: 215046

## 2021-09-16 NOTE — Progress Notes (Signed)
Remote ICD transmission.   
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
## 2021-09-26 ENCOUNTER — Encounter: Payer: Self-pay | Admitting: Adult Health

## 2021-09-26 ENCOUNTER — Ambulatory Visit: Payer: Medicare Other | Admitting: Adult Health

## 2021-09-26 VITALS — BP 110/70 | HR 53 | Ht 61.0 in | Wt 149.2 lb

## 2021-09-26 DIAGNOSIS — I6310 Cerebral infarction due to embolism of unspecified precerebral artery: Secondary | ICD-10-CM | POA: Diagnosis not present

## 2021-09-26 DIAGNOSIS — R519 Headache, unspecified: Secondary | ICD-10-CM

## 2021-09-27 ENCOUNTER — Ambulatory Visit: Payer: Medicare Other | Admitting: Family Medicine

## 2021-09-27 ENCOUNTER — Ambulatory Visit (INDEPENDENT_AMBULATORY_CARE_PROVIDER_SITE_OTHER): Payer: Medicare Other | Admitting: Family Medicine

## 2021-09-27 ENCOUNTER — Encounter: Payer: Self-pay | Admitting: Family Medicine

## 2021-09-27 VITALS — BP 90/57 | HR 54 | Temp 98.1°F | Ht 61.0 in | Wt 149.4 lb

## 2021-09-27 DIAGNOSIS — M5440 Lumbago with sciatica, unspecified side: Secondary | ICD-10-CM

## 2021-09-27 DIAGNOSIS — I10 Essential (primary) hypertension: Secondary | ICD-10-CM

## 2021-09-27 DIAGNOSIS — I5020 Unspecified systolic (congestive) heart failure: Secondary | ICD-10-CM | POA: Diagnosis not present

## 2021-09-27 MED ORDER — METHYLPREDNISOLONE 4 MG PO TBPK: ORAL_TABLET | ORAL | 0 refills | Status: DC

## 2021-09-27 MED ORDER — GABAPENTIN 100 MG PO CAPS: 100.0000 mg | ORAL_CAPSULE | Freq: Every day | ORAL | 3 refills | Status: DC

## 2021-09-27 NOTE — Patient Instructions (Signed)
It was very nice to see you today!  I think you probably have a pinched nerve in your back.  Please try the Medrol Dosepak and gabapentin.  Work on the stretches and exercises.  Let us know if not improving and we can refer you to see sports medicine.  Take care, Dr [name]  PLEASE NOTE:  If you had any lab tests please let us know if you have not heard back within a few days. You may see your results on mychart before we have a chance to review them but we will give you a call once they are reviewed by [place]. If we ordered any referrals today, please let us know if you have not heard from their office within the next week.   Please try these tips to maintain a healthy lifestyle:  Eat at least 3 REAL meals and 1-2 snacks per day.  Aim for no more than 5 hours between eating.  If you eat breakfast, please do so within one hour of getting up.   Each meal should contain half fruits/vegetables, one quarter protein, and one quarter carbs (no bigger than a computer mouse)  Cut down on sweet beverages. This includes juice, soda, and sweet tea.   Drink at least 1 glass of water with each meal and aim for at least 8 glasses per day  Exercise at least 150 minutes every week.

## 2021-09-27 NOTE — Assessment & Plan Note (Signed)
At goal on current regimen Coreg 18.75 mg twice daily.

## 2021-09-27 NOTE — Progress Notes (Signed)
   Anita Diaz is a 66 y.o. female who presents today for an office visit.  Assessment/Plan:  New/Acute Problems: Back Pain with sciatica No red flags.  We will start with conservative management.  She had an x-ray done a couple of years ago which showed degenerative changes in her lumbar spine.  We need to avoid NSAIDs due to her cardiac history.  We will start Medrol Dosepak.  Also start gabapentin at night.  We discussed home exercise program and handout was given.  She will let me know if not improving in the next 1 to 2 weeks and we can refer to PT and/or sports medicine at that time.  Chronic Problems Addressed Today: Hypertension At goal on current regimen Coreg 18.75 mg twice daily.   Heart failure with reduced ejection fraction (HCC) Euvolemic today.  We will avoid NSAIDs due to her cardiac history.     Subjective:  HPI:  Patient here with left lower back and buttocks pain.  This has been going on for a few months.  No obvious injuries or precipitating events.  Symptoms come and go.  Worse at night and worse first thing in the morning.  Pain will sometimes radiate anterior knee and anterior ankle.  Tried taking Tylenol without improvement.  Occasionally gets similar pain in her right hip.  Worse with certain motions.        Objective:  Physical Exam: BP (!) 90/57   Pulse (!) 54   Temp 98.1 F (36.7 C) (Temporal)   Ht 5\' 1"  (1.549 m)   Wt 149 lb 6.4 oz (67.8 kg)   SpO2 100%   BMI 28.23 kg/m   Gen: No acute distress, resting comfortably MSK: - Back: No deformities.  Tenderness palpation along left lower lumbar paraspinal muscles. - Lower extremities: No deformities.  Sensation light touch intact throughout.  Strength 5 out of 5 throughout.  Straight leg raise positive on the left Neuro: Grossly normal, moves all extremities Psych: Normal affect and thought content      Aviella Disbrow M. , MD 09/27/2021 11:55 AM

## 2021-09-27 NOTE — Assessment & Plan Note (Signed)
Euvolemic today.  We will avoid NSAIDs due to her cardiac history.

## 2021-10-26 NOTE — Progress Notes (Unsigned)
Chronic Care Management Pharmacy Note  10/26/2021 Name:  Anita Diaz MRN:  [id number] DOB:  [birth year]  Summary: PharmD FU visit.  Recommendations/Changes made from today's visit: **  Plan: FU 6 months   Subjective: Anita Diaz is an 66 y.o. year old female who is a primary patient of Anita Diaz, Anita Greenhouse, MD.  The CCM team was consulted for assistance with disease management and care coordination needs.    Engaged with patient by telephone for follow up visit in response to provider referral for pharmacy case management and/or care coordination services.   Consent to Services:  The patient was given the following information about Chronic Care Management services today, agreed to services, and gave verbal consent: 1. CCM service includes personalized support from designated clinical staff supervised by the primary care provider, including individualized plan of care and coordination with other care providers 2. 24/7 contact phone numbers for assistance for urgent and routine care needs. 3. Service will only be billed when office clinical staff spend 20 minutes or more in a month to coordinate care. 4. Only one practitioner may furnish and bill the service in a calendar month. 5.The patient may stop CCM services at any time (effective at the end of the month) by phone call to the office staff. 6. The patient will be responsible for cost sharing (co-pay) of up to 20% of the service fee (after annual deductible is met). Patient agreed to services and consent obtained.  Patient Care Team: Anita Barrack, MD as PCP - General (Family Medicine) Anita Pain, MD as PCP - Cardiology (Cardiology) Edythe Clarity, [facility] as Pharmacist (Pharmacist)  Recent office visits:  02/25/2021 OV (PCP) Anita Barrack, MD; Her mood is stable but she is having some issues with weight loss. This could be due to her Latuda. We will decrease dose from 60 mg to 40 mg. Continue Prozac 40 mg daily and Valium 5  mg twice daily as needed.   11/05/2020 OV (PCP) Anita Barrack, MD; A little bit better with addition of Abilify thought if feels like it to be a stronger dose. We will increase to 10 mg daily. Continue Wellbutrin 300 mg daily and Prozac 40 mg daily. She can increase her Abilify to 15 mg daily if needed over the next week or so. She will check in with me in a couple of weeks to let me know how this is doing.   10/18/2020 OV (PCP) Anita Barrack, MD; Depression not well controlled. Exacerbated by recent passing of her husband. We discussed treatment options. We will continue Wellbutrin 300 mg daily and Prozac 40 mg daily. Add on Abilify 5 mg daily. We will also start low-dose Valium to use as needed for anxiety/panic attacks. She will check in with me in a couple weeks. Depending on response we could titrate dose of Abilify, try alternative augmenting medication, or try SNRI.   Recent consult visits:  03/28/2021 OV (Neurology) Ahem, Anita Bras, MD; no medication changes indicated.   01/21/2021 OV (Cardiology) Anita Pelton, NP; Discuss the possibility of starting Diltiazem 60 mg in the future if increased palpitations.   Hospital visits:  01/11/2021 ED to Hospital Admission due to Confusion Admit date: 01/11/2021 Discharge date: 01/14/2021   ASA plus plavis for 21 days then plavis alone Statin added   -On multiple medications for her depression and unsure if taking as prescribed as discussed above. -We will continue her Prozac, and Latuda. She states she has  not been taking her Wellbutrin so will not continue however per office notes it seems that she is supposed to be on this. -Holding Abilify as she takes this on an as-needed basis and is already on [place].   Objective:  Lab Results  Component Value Date   CREATININE 0.90 02/25/2021   BUN 19 02/25/2021   GFR 67.42 02/25/2021   GFRNONAA >60 01/10/2021   GFRAA 65 05/07/2020   NA 141 02/25/2021   K 4.7 02/25/2021   CALCIUM 9.7  02/25/2021   CO2 30 02/25/2021   GLUCOSE 95 02/25/2021    Lab Results  Component Value Date/Time   HGBA1C 5.5 01/12/2021 02:19 AM   GFR 67.42 02/25/2021 09:35 AM   GFR 65.10 07/16/2020 12:05 PM    Last diabetic Eye exam: No results found for: "HMDIABEYEEXA"  Last diabetic Foot exam: No results found for: "HMDIABFOOTEX"   Lab Results  Component Value Date   CHOL 140 08/08/2021   HDL 77 08/08/2021   LDLCALC 41 08/08/2021   LDLDIRECT 67 06/08/2021   TRIG 129 08/08/2021   CHOLHDL 1.8 08/08/2021       Latest Ref Rng & Units 08/08/2021    8:05 AM 06/08/2021    3:46 PM 02/25/2021    9:35 AM  Hepatic Function  Total Protein 6.0 - 8.5 g/dL 6.5  7.0  7.5   Albumin 3.8 - 4.8 g/dL 4.5  4.7  4.5   AST 0 - 40 IU/L _0 ALT 0 - 32 IU/L 50  25  16   Alk Phosphatase 44 - 121 IU/L 73  85  74   Total Bilirubin 0.0 - 1.2 mg/dL 0.6  0.3  0.9   Bilirubin, Direct 0.00 - 0.40 mg/dL 0.18  0.12      Lab Results  Component Value Date/Time   TSH 1.21 02/25/2021 09:35 AM   TSH 1.547 01/11/2021 07:30 PM   TSH 1.200 04/28/2019 08:58 AM       Latest Ref Rng & Units 02/25/2021    9:35 AM 01/10/2021    8:06 PM 07/16/2020   12:05 PM  CBC  WBC 4.0 - 10.5 K/uL 5.9  9.1  6.8   Hemoglobin 12.0 - 15.0 g/dL 12.2  12.0  12.3   Hematocrit 36.0 - 46.0 % 37.3  36.1  37.3   Platelets 150.0 - 400.0 K/uL 327.0  331  375.0     Lab Results  Component Value Date/Time   VD25OH 30.46 08/29/2017 08:37 AM    Clinical ASCVD: Yes  The ASCVD Risk score (Arnett DK, et al., 2019) failed to calculate for the following reasons:   The patient has a prior MI or stroke diagnosis       09/27/2021   11:24 AM 08/17/2021    9:27 AM 08/05/2021    1:25 PM  Depression screen PHQ 2/9  Decreased Interest 0 0 1  Down, Depressed, Hopeless 0 0 1  PHQ - 2 Score 0 0 2  Altered sleeping  0 2  Tired, decreased energy  0 2  Change in appetite  0 1  Feeling bad or failure about yourself   0 1  Trouble concentrating  0 0   Moving slowly or fidgety/restless  0 0  Suicidal thoughts  0 0  PHQ-9 Score  0 8  Difficult doing work/chores   Not difficult at all     Social History   Tobacco Use  Smoking Status Former   Packs/day:  2.00   Years: 37.00   Total pack years: 74.00   Types: Cigarettes   Quit date: 06/22/2008   Years since quitting: 13.3  Smokeless Tobacco Never   BP Readings from Last 3 Encounters:  09/27/21 (!) 90/57  09/26/21 110/70  08/17/21 106/72   Pulse Readings from Last 3 Encounters:  09/27/21 (!) 54  09/26/21 (!) 53  08/17/21 64   Wt Readings from Last 3 Encounters:  09/27/21 149 lb 6.4 oz (67.8 kg)  09/26/21 149 lb 3.2 oz (67.7 kg)  08/17/21 146 lb (66.2 kg)   BMI Readings from Last 3 Encounters:  09/27/21 28.23 kg/m  09/26/21 28.19 kg/m  08/17/21 26.70 kg/m    Assessment/Interventions: Review of patient past medical history, allergies, medications, health status, including review of consultants reports, laboratory and other test data, was performed as part of comprehensive evaluation and provision of chronic care management services.   SDOH:  (Social Determinants of Health) assessments and interventions performed: Yes  Financial Resource Strain: Low Risk  (05/23/2021)   Overall Financial Resource Strain (CARDIA)    Difficulty of Paying Living Expenses: Not hard at all    SDOH Screenings   Alcohol Screen: Not on file  Depression (PHQ2-9): Low Risk  (09/27/2021)   Depression (PHQ2-9)    PHQ-2 Score: 0  Recent Concern: Depression (PHQ2-9) - Medium Risk (08/05/2021)   Depression (PHQ2-9)    PHQ-2 Score: 8  Financial Resource Strain: Low Risk  (05/23/2021)   Overall Financial Resource Strain (CARDIA)    Difficulty of Paying Living Expenses: Not hard at all  Food Insecurity: No Food Insecurity (05/23/2021)   Hunger Vital Sign    Worried About Running Out of Food in the Last Year: Never true    Ran Out of Food in the Last Year: Never true  Housing: Low Risk  (05/23/2021)    Housing    Last Housing Risk Score: 0  Physical Activity: Inactive (05/23/2021)   Exercise Vital Sign    Days of Exercise per Week: 0 days    Minutes of Exercise per Session: 0 min  Social Connections: Moderately Isolated (05/23/2021)   Social Connection and Isolation Panel [NHANES]    Frequency of Communication with Friends and Family: More than three times a week    Frequency of Social Gatherings with Friends and Family: Twice a week    Attends Religious Services: More than 4 times per year    Active Member of [employer] or Organizations: No    Attends Archivist Meetings: Never    Marital Status: Widowed  Stress: No Stress Concern Present (05/23/2021)   [place]    Feeling of Stress : Not at all  Tobacco Use: Medium Risk (09/27/2021)   Patient History    Smoking Tobacco Use: Former    Smokeless Tobacco Use: Never    Passive Exposure: Not on file  Transportation Needs: No Transportation Needs (05/23/2021)   PRAPARE - Hydrologist (Medical): No    Lack of Transportation (Non-Medical): No    CCM Care Plan  Allergies  Allergen Reactions   Sulfa Antibiotics Other (See Comments)    Childhood allergy    Medications Reviewed Today     Reviewed by Betti Cruz, RMA (Registered Medical Assistant) on 09/27/21 at 1125  Med List Status: <None>   Medication Order Taking? Sig Documenting Provider Last Dose Status Informant  albuterol (PROVENTIL) (2.5 MG/3ML) 0.083% nebulizer solution [id number] Yes  Take 3 mLs (2.5 mg total) by nebulization every 6 (six) hours as needed for wheezing or shortness of breath. Tawnya Crook, MD Taking Active   atorvastatin (LIPITOR) 40 MG tablet [id number] Yes Take 1 tablet (40 mg total) by mouth daily. Anita Barrack, MD Taking Active   azelastine (ASTELIN) 0.1 % nasal spray [id number] Yes Place 2 sprays into both nostrils 2 (two) times daily. Anita Barrack, MD Taking Active   carvedilol (COREG) 12.5 MG tablet [id number] Yes TAKE 1 & 1/2 (ONE & ONE-HALF) TABLETS BY MOUTH TWICE DAILY Anita Pain, MD Taking Active   clopidogrel (PLAVIX) 75 MG tablet [id number] Yes Take 1 tablet (75 mg total) by mouth daily. Anita Barrack, MD Taking Active   diazepam (VALIUM) 5 MG tablet [id number] Yes TAKE 1 TABLET BY MOUTH EVERY 12 HOURS AS NEEDED FOR ANXIETY Anita Barrack, MD Taking Active   ezetimibe (ZETIA) 10 MG tablet [id number] Yes Take 1 tablet (10 mg total) by mouth daily. Anita Pain, MD Taking Active   FLUoxetine (PROZAC) 40 MG capsule [id number] Yes Take 1 capsule by mouth once daily Anita Barrack, MD Taking Active   furosemide (LASIX) 40 MG tablet [id number] Yes TAKE 1 TABLET BY MOUTH ONCE DAILY . APPOINTMENT REQUIRED FOR FUTURE REFILLS Anita Pain, MD Taking Active   levocetirizine (XYZAL) 5 MG tablet [id number] Yes Take 5 mg by mouth every evening. [provider] Taking Active Multiple Informants  loperamide (IMODIUM A-D) 2 MG tablet [id number] Yes Take 4 mg by mouth as needed. [provider] Taking Active Multiple Informants  nitroGLYCERIN (NITROSTAT) 0.4 MG SL tablet [id number] Yes PLACE 1 TABLET UNDER THE TONGUE EVERY 5 MINUTES AS NEEDED FOR CHEST Diaz Anita Pain, MD Taking Active   pantoprazole (PROTONIX) 40 MG tablet [id number] Yes Take 1 tablet (40 mg total) by mouth daily. Anita Barrack, MD Taking Active   predniSONE (DELTASONE) 50 MG tablet [id number] Yes Take 1 tablet daily for 5 days. Then take 1/2 tablet daily for 2 days. 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   SVT (supraventricular tachycardia) ([place])  06/17/2019   TMJ syndrome 30/94/0768   Chronic systolic heart failure ([employer]) 11/08/2017   B12 deficiency 10/30/2017   Chronic left shoulder Diaz 08/29/2017   Neck Diaz 08/29/2017   Other fatigue 08/29/2017   Heart failure with reduced ejection fraction ([place]) 08/01/2017   Osteoarthritis, hand 08/01/2017   Diarrhea 06/13/2017   Chronic Diaz    Depression, major, single episode, moderate ([place])    Insomnia    Hyperlipidemia    Hypertension     Immunization History  Administered Date(s) Administered   Tdap 03/23/2021    Conditions to be addressed/monitored:  Hypertension, HFrEF, Depression, HLD, Insomnia  There are no care plans that you recently modified to display for this patient.     Medication Assistance: None required.  Patient affirms current coverage meets needs.  Compliance/Adherence/Medication fill history: Care Gaps: Zoster Vaccine  Star-Rating Drugs: Atorvastatin 60m 04/18/21 90ds  [facility] [place] [id number]: [phone]:  [phone]  Uses pill box? Yes Pt endorses 100% compliance  We discussed: Benefits of medication synchronization, packaging and delivery as well as enhanced pharmacist oversight with [employer]. Patient decided to: Continue current medication management strategy  Care Plan and Follow Up Patient Decision:  Patient agrees to Care Plan and Follow-up.  Plan: The care management team will reach out to the patient again over the next 180 days.  Beverly Milch, PharmD Clinical Pharmacist  [facility] [phone]   Current Barriers:  Unable to independently afford treatment regimen Unable to maintain control of depression  Pharmacist Clinical Goal(s):  Patient will achieve improvement in depression and lipids as evidenced by labs/symptoms through collaboration with PharmD and provider.    Interventions: 1:1 collaboration with Anita Barrack, MD regarding development and update of comprehensive plan of care as evidenced by provider attestation and co-signature Inter-disciplinary care team collaboration (see longitudinal plan of care) Comprehensive medication review performed; medication list updated in electronic medical record  Hypertension (BP goal <130/80) -Controlled, based on recent office readings -Current treatment: Carvedilol 12.91m BID - Appropriate, Effective, Safe, Accessible -Medications previously tried: lisinopril  -Current home readings: not checking at home -Current exercise habits: minimal organized physical activity, she is active cleaning houses for work -Denies hypotensive/hypertensive symptoms -Educated on BP goals and benefits of medications for prevention of heart attack, stroke and kidney damage; Importance of home blood pressure monitoring; Symptoms of hypotension and importance of maintaining adequate hydration; -Counseled to monitor BP at home if able, document, and provide log at future appointments -Recommended to continue current medication  Hyperlipidemia: (LDL goal < 70) -Uncontrolled -Current treatment: Atorvastatin 468mdaily - Appropriate, Effective, Safe, Accessible -Medications previously tried: none noted  -Educated on Cholesterol goals;  Benefits of statin for ASCVD risk reduction; Importance of limiting foods high in cholesterol; Most recent LDL is elevated, she started Lipitor after this lipid panel, tolerating medication fine at this time.  Denies any cramping or myalgias -Recommended to continue current medication Work on limiting fried/fatty foods, recheck lipids next OV  Heart Failure (Goal: manage symptoms and prevent exacerbations) -Controlled -Last ejection fraction: 45-50% -Current treatment: Furosemide 4024maily - Appropriate, Effective, Safe, Accessible Entresto 24-71m19mily - Appropriate, Effective, Safe,  Accessible Carvedilol 12.5mg 89m - Appropriate, Effective, Safe, Accessible -Medications previously tried: none noted  -Current home BP/HR readings: not checking at home -Educated on Benefits of medications for managing symptoms and prolonging life Importance of weighing daily; if you gain more than 3 pounds in one day or 5 pounds in one week, contact providers Importance of blood pressure control -Recommended to continue current medication Patient is monitoring fluid status, she denies any swelling or SOB at this time.  Has gained some weight but attributes this to dietary sources and not fluid. Continue to monitor weight, contact providers with any swelling.  Depression/Anxiety (Goal: Reduce symptoms) -Not ideally controlled -Current treatment: Fluoxetine 40mg 73my - Appropriate, Query effective, -Medications previously tried/failed: Latuda ($), Abilify, Wellbutrin, citalopram -    09/27/2021   11:24 AM 08/17/2021    9:27 AM 08/05/2021    1:25 PM  PHQ9 SCORE ONLY  PHQ-9 Total Score 0 0 8   -GAD7: No flowsheet data found. -Educated on Benefits of medication for symptom control No longer taking her Abilify or Wellbutrin.  Reports that her LatudaAnette Guarnerixpensive this is why she had to stop.  No patient assistance available for patients on Medicare.  I feel she may benefit from restarting Abilify due to ongoing  lingering depression. -Recommended to continue current medication Will consult with PCP on resuming Abilify.  Insomnia (Goal: Improve sleep) -Not ideally controlled -Current treatment  Diazepam 7m q12h prn anxiety -Medications previously tried: trazodone -Sleep is off and on  -Recommended to continue current medication Possible untreated depression leading to sleep disturbances.  Patient Goals/Self-Care Activities Patient will:  - take medications as prescribed as evidenced by patient report and record review check blood pressure as able, document, and provide at future  appointments weigh daily, and contact provider if weight gain of 3 lbs in one day or 5 lbs in one week  Follow Up Plan: The care management team will reach out to the patient again over the next 180 days.

## 2021-11-01 ENCOUNTER — Ambulatory Visit: Payer: Medicare Other | Admitting: Pharmacist

## 2021-11-01 DIAGNOSIS — F321 Major depressive disorder, single episode, moderate: Secondary | ICD-10-CM

## 2021-11-01 DIAGNOSIS — E7800 Pure hypercholesterolemia, unspecified: Secondary | ICD-10-CM

## 2021-11-02 ENCOUNTER — Encounter: Payer: Self-pay | Admitting: Family Medicine

## 2021-11-02 ENCOUNTER — Ambulatory Visit (INDEPENDENT_AMBULATORY_CARE_PROVIDER_SITE_OTHER): Payer: Medicare Other | Admitting: Family Medicine

## 2021-11-02 VITALS — BP 106/68 | HR 50 | Temp 98.0°F | Ht 61.0 in | Wt 144.0 lb

## 2021-11-02 DIAGNOSIS — I10 Essential (primary) hypertension: Secondary | ICD-10-CM

## 2021-11-02 DIAGNOSIS — M5440 Lumbago with sciatica, unspecified side: Secondary | ICD-10-CM | POA: Diagnosis not present

## 2021-11-02 DIAGNOSIS — F321 Major depressive disorder, single episode, moderate: Secondary | ICD-10-CM

## 2021-11-02 MED ORDER — HYDROCODONE-ACETAMINOPHEN 5-325 MG PO TABS: 1.0000 | ORAL_TABLET | Freq: Four times a day (QID) | ORAL | 0 refills | Status: DC | PRN

## 2021-11-02 MED ORDER — SERTRALINE HCL 100 MG PO TABS: 100.0000 mg | ORAL_TABLET | Freq: Every day | ORAL | 3 refills | Status: DC

## 2021-11-02 NOTE — Assessment & Plan Note (Signed)
Blood pressure at goal on Coreg 18.75 mg twice daily per cardiology.

## 2021-11-02 NOTE — Assessment & Plan Note (Signed)
Not controlled.  No SI or HI.  She has previously been on Wellbutrin but she is not sure why this was stopped.  She is not sure if the Prozac was ever effective in the first place.  We discussed options including augmenting with Wellbutrin versus switching her Prozac to Zoloft.  Given that she has not had any obvious effect from Prozac we decided to switch to Zoloft 100 mg daily.  She will follow-up in a few weeks via MyChart.  We can titrate the dose as needed.  At some point may need to consider adding Wellbutrin.

## 2021-11-02 NOTE — Progress Notes (Signed)
   Anita Diaz is a 66 y.o. female who presents today for an office visit.  Assessment/Plan:  New/Acute Problems: Low Back Pain with Sciatica No red flags.  She had an x-ray 3 years ago which showed degenerative changes in her lumbar spine.  She has not had much of a response to home exercises and course of steroids.  We need to avoid NSAIDs due to her cardiac history.  She has had poor reactions to tramadol in the past.  We will give small supply of hydrocodone.  We discussed potential side effects.  Also discussed with patient this would not be a long-term prescription.  We will place referral to orthopedics for further management.  Chronic Problems Addressed Today: Depression, major, single episode, moderate (HCC) Not controlled.  No SI or HI.  She has previously been on Wellbutrin but she is not sure why this was stopped.  She is not sure if the Prozac was ever effective in the first place.  We discussed options including augmenting with Wellbutrin versus switching her Prozac to Zoloft.  Given that she has not had any obvious effect from Prozac we decided to switch to Zoloft 100 mg daily.  She will follow-up in a few weeks via MyChart.  We can titrate the dose as needed.  At some point may need to consider adding Wellbutrin.  Hypertension Blood pressure at goal on Coreg 18.75 mg twice daily per cardiology.     Subjective:  HPI:  See A/P for status of chronic conditions.  She feels like her depressive symptoms have been worsening.  She does not feel the Prozac is effective.  She would like to make medication changes possible.  No reported SI or HI.  We also saw her about a month ago for bilateral hip pain with pain radiating into her left buttocks and left lower leg.  There was concern for possible lumbar radiculopathy.  She started on Medrol Dosepak.  This helped modestly.  We also started gabapentin.  This caused headache and did not provide any relief.  Her pain has been about the same  for the last month.  No worsening symptoms.       Objective:  Physical Exam: BP 106/68   Pulse (!) 50   Temp 98 F (36.7 C) (Temporal)   Ht 5\' 1"  (1.549 m)   Wt 144 lb (65.3 kg)   SpO2 95%   BMI 27.21 kg/m   Gen: No acute distress, resting comfortably CV: Regular rate and rhythm with no murmurs appreciated Pulm: Normal work of breathing, clear to auscultation bilaterally with no crackles, wheezes, or rhonchi Neuro: Grossly normal, moves all extremities Psych: Normal affect and thought content      Olamae Ferrara M. , MD 11/02/2021 9:42 AM

## 2021-11-02 NOTE — Patient Instructions (Signed)
It was very nice to see you today!  Please switch your Prozac to Zoloft.  We will refer you to orthopedics for your pain.  I will send in a small supply of hydrocodone to use as needed.  Take care, Dr [name]  PLEASE NOTE:  If you had any lab tests please let us know if you have not heard back within a few days. You may see your results on mychart before we have a chance to review them but we will give you a call once they are reviewed by [place]. If we ordered any referrals today, please let us know if you have not heard from their office within the next week.   Please try these tips to maintain a healthy lifestyle:  Eat at least 3 REAL meals and 1-2 snacks per day.  Aim for no more than 5 hours between eating.  If you eat breakfast, please do so within one hour of getting up.   Each meal should contain half fruits/vegetables, one quarter protein, and one quarter carbs (no bigger than a computer mouse)  Cut down on sweet beverages. This includes juice, soda, and sweet tea.   Drink at least 1 glass of water with each meal and aim for at least 8 glasses per day  Exercise at least 150 minutes every week.

## 2021-11-02 NOTE — Patient Instructions (Addendum)
Visit Information   Goals Addressed             This Visit's Progress    Track and Manage My Symptoms-Depression   On track    Timeframe:  Long-Range Goal Priority:  High Start Date:  05/03/21                           Expected End Date:  10/31/21                     Follow Up Date 08/01/21    - develop a personal safety plan - have a plan for how to handle bad days - spend time or talk with others at least 2 to 3 times per week - watch for early signs of feeling worse    Why is this important?   Keeping track of your progress will help your treatment team find the right mix of medicine and therapy for you.  Write in your journal every day.  Day-to-day changes in depression symptoms are normal. It may be more helpful to check your progress at the end of each week instead of every day.     Notes:        Patient Care Plan: General Pharmacy (Adult)     Problem Identified: Hypertension, HFrEF, Depression, HLD, Insomnia   Priority: High  Onset Date: 05/02/2021     Long-Range Goal: Patient-Specific Goal   Start Date: 05/02/2021  Expected End Date: 10/31/2021  Recent Progress: On track  Priority: High  Note:   Current Barriers:  Unable to independently afford treatment regimen Unable to maintain control of depression  Pharmacist Clinical Goal(s):  Patient will achieve improvement in depression and lipids as evidenced by labs/symptoms through collaboration with PharmD and provider.   Interventions: 1:1 collaboration with Ardith Dark, MD regarding development and update of comprehensive plan of care as evidenced by provider attestation and co-signature Inter-disciplinary care team collaboration (see longitudinal plan of care) Comprehensive medication review performed; medication list updated in electronic medical record  Hypertension (BP goal <130/80) -Controlled, not assessed today -Current treatment: Carvedilol 12.5mg  BID - Appropriate, Effective, Safe,  Accessible -Medications previously tried: lisinopril  -Current home readings: not checking at home -Current exercise habits: minimal organized physical activity, she is active cleaning houses for work -Denies hypotensive/hypertensive symptoms -Educated on BP goals and benefits of medications for prevention of heart attack, stroke and kidney damage; Importance of home blood pressure monitoring; Symptoms of hypotension and importance of maintaining adequate hydration; -Counseled to monitor BP at home if able, document, and provide log at future appointments -Recommended to continue current medication  Hyperlipidemia: (LDL goal < 70) 11/01/21 Controlled, LDL 41 in April 2023 -Current treatment: Atorvastatin 40mg  daily - Appropriate, Effective, Safe, Accessible -Medications previously tried: none noted  -Educated on Cholesterol goals;  Benefits of statin for ASCVD risk reduction; Importance of limiting foods high in cholesterol;  Denies any cramping or myalgias -Recommended to continue current medication Since starting Lipitor her LDL has decreased to goal < 70!  Tolerating the medication well. Continue adherence to medication, discussed importance of reduction of CV risk. No changes at this time, continue routine lipid screenings.  Heart Failure (Goal: manage symptoms and prevent exacerbations) -Controlled, not assessed today -Last ejection fraction: 45-50% -Current treatment: Furosemide 40mg  daily - Appropriate, Effective, Safe, Accessible Carvedilol 12.5mg  BID - Appropriate, Effective, Safe, Accessible -Medications previously tried: none noted  -Current home BP/HR readings: not  checking at home -Educated on Benefits of medications for managing symptoms and prolonging life Importance of weighing daily; if you gain more than 3 pounds in one day or 5 pounds in one week, contact providers Importance of blood pressure control -Recommended to continue current medication Patient is  monitoring fluid status, she denies any swelling or SOB at this time.  Has gained some weight but attributes this to dietary sources and not fluid. Continue to monitor weight, contact providers with any swelling.  Depression/Anxiety (Goal: Reduce symptoms) 11/01/21 -Not ideally controlled -Current treatment: Fluoxetine 40mg  daily - Appropriate, Query effective, -Medications previously tried/failed: Latuda ($), Abilify, Wellbutrin, citalopram    11/02/2021    9:39 AM 09/27/2021   11:24 AM 08/17/2021    9:27 AM  PHQ9 SCORE ONLY  PHQ-9 Total Score 13 0 0   -GAD7: No flowsheet data found. -Educated on Benefits of medication for symptom control Patient states she still has "good days and bad days."  Some days she has motivation to get out and do things and other days she does not.  Does feel like pain can be contributing to her mood.  Recommend she discuss with PCP at upcoming visit. I would consider additional medication.  Latuda did work for her but it was expensive, could consider resuming Abilify as it is not generic and affordable. Will discuss with PCP.  Initial Visit No longer taking her Abilify or Wellbutrin.  Reports that her 08/19/2021 was expensive this is why she had to stop.  No patient assistance available for patients on Medicare.  I feel she may benefit from restarting Abilify due to ongoing lingering depression.  Insomnia (Goal: Improve sleep) -Not ideally controlled, not assessed today -Current treatment  Diazepam 5mg  q12h prn anxiety -Medications previously tried: trazodone -Sleep is off and on  -Recommended to continue current medication Possible untreated depression leading to sleep disturbances.  Patient Goals/Self-Care Activities Patient will:  - take medications as prescribed as evidenced by patient report and record review check blood pressure as able, document, and provide at future appointments weigh daily, and contact provider if weight gain of 3 lbs in one day or  5 lbs in one week  Follow Up Plan: The care management team will reach out to the patient again over the next 180 days.          The patient verbalized understanding of instructions, educational materials, and care plan provided today and DECLINED offer to receive copy of patient instructions, educational materials, and care plan.  Telephone follow up appointment with pharmacy team member scheduled for: 6 months  Kasandra Knudsen, [facility]  Erroll Luna, PharmD Clinical Pharmacist  [facility] [phone]

## 2021-11-07 ENCOUNTER — Ambulatory Visit: Payer: Medicare Other | Admitting: Family Medicine
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
## 2021-11-09 ENCOUNTER — Encounter: Payer: Self-pay | Admitting: Nurse Practitioner

## 2021-11-09 ENCOUNTER — Ambulatory Visit (INDEPENDENT_AMBULATORY_CARE_PROVIDER_SITE_OTHER): Payer: Medicare Other | Admitting: Nurse Practitioner

## 2021-11-09 VITALS — BP 100/58 | HR 59 | Ht 61.0 in | Wt 144.6 lb

## 2021-11-09 DIAGNOSIS — I639 Cerebral infarction, unspecified: Secondary | ICD-10-CM | POA: Diagnosis not present

## 2021-11-09 DIAGNOSIS — I5022 Chronic systolic (congestive) heart failure: Secondary | ICD-10-CM | POA: Diagnosis not present

## 2021-11-09 DIAGNOSIS — I428 Other cardiomyopathies: Secondary | ICD-10-CM | POA: Diagnosis not present

## 2021-11-09 DIAGNOSIS — I10 Essential (primary) hypertension: Secondary | ICD-10-CM | POA: Diagnosis not present

## 2021-11-09 DIAGNOSIS — E785 Hyperlipidemia, unspecified: Secondary | ICD-10-CM

## 2021-11-09 NOTE — Patient Instructions (Signed)
Medication Instructions:  DECREASE CARVEDILOL TO 12.5 MG TWICE DAILY  *If you need a refill on your cardiac medications before your next appointment, please call your pharmacy*   Lab Work: NONE If you have labs (blood work) drawn today and your tests are completely normal, you will receive your results only by: MyChart Message (if you have MyChart) OR A paper copy in the mail If you have any lab test that is abnormal or we need to change your treatment, we will call you to review the results.   Testing/Procedures: NONE   Follow-Up: At [facility], you and your health needs are our priority.  As part of our continuing mission to provide you with exceptional heart care, we have created designated Provider Care Teams.  These Care Teams include your primary Cardiologist (physician) and Advanced Practice Providers (APPs -  Physician Assistants and Nurse Practitioners) who all work together to provide you with the care you need, when you need it.  We recommend signing up for the patient portal called "MyChart".  Sign up information is provided on this After Visit Summary.  MyChart is used to connect with patients for Virtual Visits (Telemedicine).  Patients are able to view lab/test results, encounter notes, upcoming appointments, etc.  Non-urgent messages can be sent to your provider as well.   To learn more about what you can do with MyChart, go to [email or web address].    Your next appointment:   6 month(s)  The format for your next appointment:   In Person  Provider:  Robin Searing NP ERNEST DICK     Other Instructions MONITOR B/P FOR 1 MONTH AND CALL WITH READINGS   Important Information About Sugar

## 2021-11-14 DIAGNOSIS — M25552 Pain in left hip: Secondary | ICD-10-CM | POA: Diagnosis not present

## 2021-11-14 DIAGNOSIS — M545 Low back pain, unspecified: Secondary | ICD-10-CM | POA: Diagnosis not present

## 2021-11-23 DIAGNOSIS — M6281 Muscle weakness (generalized): Secondary | ICD-10-CM | POA: Diagnosis not present

## 2021-11-23 DIAGNOSIS — S39012D Strain of muscle, fascia and tendon of lower back, subsequent encounter: Secondary | ICD-10-CM | POA: Diagnosis not present

## 2021-11-23 DIAGNOSIS — M7062 Trochanteric bursitis, left hip: Secondary | ICD-10-CM | POA: Diagnosis not present

## 2021-11-25 ENCOUNTER — Other Ambulatory Visit: Payer: Self-pay | Admitting: Family Medicine

## 2021-11-30 DIAGNOSIS — M6281 Muscle weakness (generalized): Secondary | ICD-10-CM | POA: Diagnosis not present

## 2021-11-30 DIAGNOSIS — M7062 Trochanteric bursitis, left hip: Secondary | ICD-10-CM | POA: Diagnosis not present

## 2021-11-30 DIAGNOSIS — S39012D Strain of muscle, fascia and tendon of lower back, subsequent encounter: Secondary | ICD-10-CM | POA: Diagnosis not present

## 2021-12-05 ENCOUNTER — Other Ambulatory Visit: Payer: Self-pay | Admitting: Family Medicine

## 2021-12-07 DIAGNOSIS — M7062 Trochanteric bursitis, left hip: Secondary | ICD-10-CM | POA: Diagnosis not present

## 2021-12-07 DIAGNOSIS — M6281 Muscle weakness (generalized): Secondary | ICD-10-CM | POA: Diagnosis not present

## 2021-12-07 DIAGNOSIS — S39012D Strain of muscle, fascia and tendon of lower back, subsequent encounter: Secondary | ICD-10-CM | POA: Diagnosis not present

## 2021-12-08 ENCOUNTER — Ambulatory Visit (INDEPENDENT_AMBULATORY_CARE_PROVIDER_SITE_OTHER): Payer: Medicare Other

## 2021-12-08 DIAGNOSIS — I428 Other cardiomyopathies: Secondary | ICD-10-CM | POA: Diagnosis not present

## 2021-12-14 ENCOUNTER — Ambulatory Visit (INDEPENDENT_AMBULATORY_CARE_PROVIDER_SITE_OTHER): Payer: Medicare Other | Admitting: Family Medicine

## 2021-12-14 VITALS — BP 111/72 | HR 61 | Temp 98.3°F | Ht 61.0 in | Wt 138.6 lb

## 2021-12-14 DIAGNOSIS — M6281 Muscle weakness (generalized): Secondary | ICD-10-CM | POA: Diagnosis not present

## 2021-12-14 DIAGNOSIS — I10 Essential (primary) hypertension: Secondary | ICD-10-CM

## 2021-12-14 DIAGNOSIS — S39012D Strain of muscle, fascia and tendon of lower back, subsequent encounter: Secondary | ICD-10-CM | POA: Diagnosis not present

## 2021-12-14 DIAGNOSIS — M7062 Trochanteric bursitis, left hip: Secondary | ICD-10-CM | POA: Diagnosis not present

## 2021-12-14 DIAGNOSIS — R3915 Urgency of urination: Secondary | ICD-10-CM

## 2021-12-14 DIAGNOSIS — E538 Deficiency of other specified B group vitamins: Secondary | ICD-10-CM | POA: Diagnosis not present

## 2021-12-14 LAB — CUP PACEART REMOTE DEVICE CHECK
Battery Remaining Longevity: 102 mo
Battery Remaining Percentage: 100 %
Brady Statistic RA Percent Paced: 10 %
Brady Statistic RV Percent Paced: 1 %
Date Time Interrogation Session: 20230818104500
HighPow Impedance: 80 Ohm
Implantable Lead Implant Date: 20190718
Implantable Lead Implant Date: 20190718
Implantable Lead Implant Date: 20190718
Implantable Lead Location: 753858
Implantable Lead Location: 753859
Implantable Lead Location: 753860
Implantable Lead Model: 292
Implantable Lead Model: 4671
Implantable Lead Model: 7740
Implantable Lead Serial Number: 447121
Implantable Lead Serial Number: 694576
Implantable Lead Serial Number: 815914
Implantable Pulse Generator Implant Date: 20190718
Lead Channel Impedance Value: 431 Ohm
Lead Channel Impedance Value: 468 Ohm
Lead Channel Impedance Value: 549 Ohm
Lead Channel Setting Pacing Amplitude: 2 V
Lead Channel Setting Pacing Amplitude: 2.4 V
Lead Channel Setting Pacing Amplitude: 2.6 V
Lead Channel Setting Pacing Pulse Width: 0.4 ms
Lead Channel Setting Pacing Pulse Width: 0.4 ms
Lead Channel Setting Sensing Sensitivity: 0.5 mV
Lead Channel Setting Sensing Sensitivity: 1 mV
Pulse Gen Serial Number: 215046

## 2021-12-14 LAB — POCT URINALYSIS DIPSTICK
Bilirubin, UA: NEGATIVE
Blood, UA: NEGATIVE
Glucose, UA: NEGATIVE
Ketones, UA: NEGATIVE
Nitrite, UA: NEGATIVE
Protein, UA: NEGATIVE
Spec Grav, UA: 1.015 (ref 1.010–1.025)
Urobilinogen, UA: 0.2 E.U./dL
pH, UA: 6 (ref 5.0–8.0)

## 2021-12-14 MED ORDER — NITROFURANTOIN MONOHYD MACRO 100 MG PO CAPS: 100.0000 mg | ORAL_CAPSULE | Freq: Two times a day (BID) | ORAL | 0 refills | Status: DC

## 2021-12-14 NOTE — Progress Notes (Signed)
   Anita Diaz is a 66 y.o. female who presents today for an office visit.  Assessment/Plan:  New/Acute Problems: UTI UA and history consistent with UTI.  No signs of systemic illness.  We will start Macrobid 100 mg twice daily x7 days.  Urine cultures pending.  Encouraged hydration.  Cramping No red flags.  Possibly due to dehydration.  Recommend she increase her fluid intake.  Also recommend she take potassium rich foods such as potassium to see if this helps.  She will let us know if not improving.  Bloating No red flags.  Recommend she start probiotic supplementation.  She can also use simethicone as needed.  She will let us know if not improving.  Chronic Problems Addressed Today: Hypertension Blood pressure at goal today on Coreg 18.75 mg twice daily per cardiology.  B12 deficiency Could be contributing some to her nocturnal cramping.  Recommend she continue B12 supplementation.  Continues to have cramping she will come back to have B12 level checked.     Subjective:  HPI:  Patient here with concern for UTI. Started about 4-5 days ago. Associated with urgency. No fevers or chills. Some back pain. No treatments tried.    She also had nocturnal leg cramping for last several weeks.  Also occurs at night.  No specific treatments tried.  She has also had excess gas production and bloating for several months.  She does have a history of chronic diarrhea and takes Imodium occasionally as needed.  No melena.  No hematochezia.  No nausea or vomiting.  No unintentional weight loss.       Objective:  Physical Exam: BP 111/72   Pulse 61   Temp 98.3 F (36.8 C) (Temporal)   Ht 5\' 1"  (1.549 m)   Wt 138 lb 9.6 oz (62.9 kg)   SpO2 97%   BMI 26.19 kg/m   Wt Readings from Last 3 Encounters:  12/14/21 138 lb 9.6 oz (62.9 kg)  11/09/21 144 lb 9.6 oz (65.6 kg)  11/02/21 144 lb (65.3 kg)  Gen: No acute distress, resting comfortably Neuro: Grossly normal, moves all  extremities Psych: Normal affect and thought content      Magdelyn Roebuck M. 01/03/22, MD 12/14/2021 12:03 PM

## 2021-12-14 NOTE — Assessment & Plan Note (Signed)
Blood pressure at goal today on Coreg 18.75 mg twice daily per cardiology.

## 2021-12-14 NOTE — Patient Instructions (Signed)
It was very nice to see you today!  You probably have a UTI.  Please start the Macrobid.  We will call you with the culture results if we need to make a change.  Please make that you are getting plenty of fluids.  You can try eating potassium rich foods such as bananas this if this helps with the cramping.  For your bloating and gas production you can try over-the-counter simethicone.  Please try also taking a probiotic supplement to see if this helps.  Take care, Dr [name]  PLEASE NOTE:  If you had any lab tests please let us know if you have not heard back within a few days. You may see your results on mychart before we have a chance to review them but we will give you a call once they are reviewed by [place]. If we ordered any referrals today, please let us know if you have not heard from their office within the next week.   Please try these tips to maintain a healthy lifestyle:  Eat at least 3 REAL meals and 1-2 snacks per day.  Aim for no more than 5 hours between eating.  If you eat breakfast, please do so within one hour of getting up.   Each meal should contain half fruits/vegetables, one quarter protein, and one quarter carbs (no bigger than a computer mouse)  Cut down on sweet beverages. This includes juice, soda, and sweet tea.   Drink at least 1 glass of water with each meal and aim for at least 8 glasses per day  Exercise at least 150 minutes every week.

## 2021-12-14 NOTE — Assessment & Plan Note (Signed)
Could be contributing some to her nocturnal cramping.  Recommend she continue B12 supplementation.  Continues to have cramping she will come back to have B12 level checked.

## 2021-12-15 DIAGNOSIS — R3915 Urgency of urination: Secondary | ICD-10-CM | POA: Diagnosis not present

## 2021-12-16 LAB — URINE CULTURE
MICRO NUMBER:: 13826853
SPECIMEN QUALITY:: ADEQUATE

## 2021-12-20 NOTE — Progress Notes (Signed)
Please inform patient of the following:  Urine culture is inconclusive. Would like for her to let us know if her symptoms are not improving.  Anita Diaz. [name], MD 12/20/2021 12:56 PM

## 2021-12-23 DIAGNOSIS — M7062 Trochanteric bursitis, left hip: Secondary | ICD-10-CM | POA: Diagnosis not present

## 2021-12-23 DIAGNOSIS — S39012D Strain of muscle, fascia and tendon of lower back, subsequent encounter: Secondary | ICD-10-CM | POA: Diagnosis not present

## 2021-12-23 DIAGNOSIS — M6281 Muscle weakness (generalized): Secondary | ICD-10-CM | POA: Diagnosis not present

## 2021-12-28 ENCOUNTER — Other Ambulatory Visit: Payer: Self-pay | Admitting: Orthopedic Surgery

## 2021-12-28 ENCOUNTER — Other Ambulatory Visit (HOSPITAL_COMMUNITY): Payer: Self-pay | Admitting: Orthopedic Surgery

## 2021-12-28 DIAGNOSIS — M5416 Radiculopathy, lumbar region: Secondary | ICD-10-CM

## 2021-12-28 DIAGNOSIS — M25551 Pain in right hip: Secondary | ICD-10-CM | POA: Diagnosis not present

## 2022-01-05 NOTE — Progress Notes (Signed)
Remote ICD transmission.   

## 2022-02-03 ENCOUNTER — Other Ambulatory Visit: Payer: Self-pay

## 2022-02-03 MED ORDER — SACUBITRIL-VALSARTAN 24-26 MG PO TABS: 1.0000 | ORAL_TABLET | Freq: Two times a day (BID) | ORAL | 2 refills | Status: DC

## 2022-02-06 ENCOUNTER — Ambulatory Visit (HOSPITAL_COMMUNITY)
Admission: RE | Admit: 2022-02-06 | Discharge: 2022-02-06 | Disposition: A | Discharge status: Home / Self Care | Payer: Medicare Other | Source: Ambulatory Visit | Attending: Orthopedic Surgery | Admitting: Orthopedic Surgery

## 2022-02-06 DIAGNOSIS — M545 Low back pain, unspecified: Secondary | ICD-10-CM | POA: Diagnosis not present

## 2022-02-06 DIAGNOSIS — M5416 Radiculopathy, lumbar region: Secondary | ICD-10-CM | POA: Insufficient documentation

## 2022-02-06 NOTE — Progress Notes (Signed)
Patient here today at [facility] for MRI lumbar spine wo contrast. Patient has [employer] ICD. [facility] performed with joey-rep. Orders for DOO 75. Will re-program once scan is completed.

## 2022-02-10 ENCOUNTER — Telehealth: Payer: Self-pay | Admitting: Pharmacist

## 2022-02-10 NOTE — Progress Notes (Unsigned)
Chronic Care Management Pharmacy Assistant   Name: Anita Diaz  MRN: [id number] DOB: 19-Jul-1955  Reason for Encounter: General Adherence Call    Recent office visits:  12/14/2021 [facility] (PCP) [name], MD;  We will start Macrobid 100 mg twice daily x7 days  11/02/2021 OV (PCP) [name], MD;  we decided to switch to Zoloft 100 mg daily  Recent consult visits:  11/09/2021 [facility] (Cardiology) Marylu Lund., NP; -We will reduce carvedilol to 12.5 mg twice daily, -We will plan to add [name] to her current GDMT if blood pressures remain stable after 1 month, -If blood pressures remain low may change Lasix dose to every other day.  Hospital visits:  None in previous 6 months  Medications: Outpatient Encounter Medications as of 02/10/2022  Medication Sig   albuterol (PROVENTIL) (2.5 MG/3ML) 0.083% nebulizer solution Take 3 mLs (2.5 mg total) by nebulization every 6 (six) hours as needed for wheezing or shortness of breath.   atorvastatin (LIPITOR) 40 MG tablet Take 1 tablet (40 mg total) by mouth daily.   azelastine (ASTELIN) 0.1 % nasal spray Place 2 sprays into both nostrils 2 (two) times daily.   carvedilol (COREG) 12.5 MG tablet TAKE 1 & 1/2 (ONE & ONE-HALF) TABLETS BY MOUTH TWICE DAILY (Patient taking differently: Take 12.5 mg by mouth 2 (two) times daily with a meal. TAKE 1 & 1/2 (ONE & ONE-HALF) TABLETS BY MOUTH TWICE DAILY)   clopidogrel (PLAVIX) 75 MG tablet Take 1 tablet by mouth once daily   diazepam (VALIUM) 5 MG tablet TAKE 1 TABLET BY MOUTH EVERY 12 HOURS AS NEEDED FOR ANXIETY   ezetimibe (ZETIA) 10 MG tablet Take 1 tablet (10 mg total) by mouth daily.   furosemide (LASIX) 40 MG tablet TAKE 1 TABLET BY MOUTH ONCE DAILY . APPOINTMENT REQUIRED FOR FUTURE REFILLS   HYDROcodone-acetaminophen (NORCO) 5-325 MG tablet Take 1 tablet by mouth every 6 (six) hours as needed for moderate pain.   levocetirizine (XYZAL) 5 MG tablet Take 5 mg by mouth every evening.    loperamide (IMODIUM A-D) 2 MG tablet Take 4 mg by mouth as needed.   nitrofurantoin, macrocrystal-monohydrate, (MACROBID) 100 MG capsule Take 1 capsule (100 mg total) by mouth 2 (two) times daily.   nitroGLYCERIN (NITROSTAT) 0.4 MG SL tablet PLACE 1 TABLET UNDER THE TONGUE EVERY 5 MINUTES AS NEEDED FOR CHEST PAIN   pantoprazole (PROTONIX) 40 MG tablet Take 1 tablet by mouth once daily   sacubitril-valsartan (ENTRESTO) 24-26 MG Take 1 tablet by mouth 2 (two) times daily.   sertraline (ZOLOFT) 100 MG tablet Take 1 tablet (100 mg total) by mouth daily.   No facility-administered encounter medications on file as of 02/10/2022.   Contacted [name] for General Review Call   Chart Review:  Have there been any documented new, changed, or discontinued medications since last visit? Yes Has there been any documented recent hospitalizations or ED visits since last visit with Clinical Pharmacist? No Brief Summary: We will reduce carvedilol to 12.5 mg twice daily, -We will plan to add [name] to her current GDMT if blood pressures remain stable after 1 month, -If blood pressures remain low may change Lasix dose to every other day.   Adherence Review:  Does the Clinical Pharmacist Assistant have access to adherence rates? Yes Adherence rates for STAR metric medications: Atorvastatin 40 mg last filled 10/30/2021 90 DS Does the patient have >5 day gap between last estimated fill dates for any  of the above medications or other medication gaps? No Reason for medication gaps: None   Disease State Questions:  Able to connect with Patient? Yes Did patient have any problems with their health recently? No Have you had any admissions or emergency room visits or worsening of your condition(s) since last visit? No Have you had any visits with new specialists or providers since your last visit? No Have you had any new health care problem(s) since your last visit? No Have you run out of any of your  medications since you last spoke with clinical pharmacist? No Are there any medications you are not taking as prescribed? No Are you having any issues or side effects with your medications? No Do you have any other health concerns or questions you want to discuss with your Clinical Pharmacist before your next visit? No Are there any health concerns that you feel we can do a better job addressing? No Are you having any problems with any of the following since the last visit: (select all that apply)  None 12. Any falls since last visit? No 13. Any increased or uncontrolled pain since last visit? No   Patient reports today's blood pressure at 124/74 (02/13/2022). She states she has not yet followed up with her Cardiologist to report how her blood pressure has been since decreasing it to 12.5 mg twice daily. She plans to do so soon.  -Patient states she still feels really depressed since switching to Zoloft.   Care Gaps: Medicare Annual Wellness: Completed 05/23/2021 Hemoglobin A1C: 5.5% on 01/12/2021 Colonoscopy: Next due on 02/22/2022 Mammogram: Next due on 06/14/2022  Future Appointments  Date Time Provider [place]  03/09/2022  7:00 AM [facility] DEVICE REMOTES [facility]  05/09/2022  2:00 PM [facility] CCM PHARMACIST [facility]  06/08/2022  7:00 AM [facility] DEVICE REMOTES [facility]  09/07/2022  7:00 AM [facility] DEVICE REMOTES [facility]  12/07/2022  7:00 AM [facility] DEVICE REMOTES [facility]  03/08/2023  7:00 AM [facility] DEVICE REMOTES [facility]  06/07/2023  7:00 AM [facility] DEVICE REMOTES [facility]   Star Rating Drugs: Atorvastatin 40 mg last filled 10/30/2021 90 DS  April D Calhoun, [place] Pharmacist Assistant [phone]

## 2022-02-22 ENCOUNTER — Other Ambulatory Visit: Payer: Self-pay | Admitting: Family Medicine

## 2022-02-25 ENCOUNTER — Other Ambulatory Visit: Payer: Self-pay | Admitting: Family Medicine

## 2022-02-27 DIAGNOSIS — M5416 Radiculopathy, lumbar region: Secondary | ICD-10-CM | POA: Diagnosis not present

## 2022-03-09 ENCOUNTER — Ambulatory Visit (INDEPENDENT_AMBULATORY_CARE_PROVIDER_SITE_OTHER): Payer: Medicare Other

## 2022-03-09 DIAGNOSIS — I428 Other cardiomyopathies: Secondary | ICD-10-CM

## 2022-03-13 LAB — CUP PACEART REMOTE DEVICE CHECK
Battery Remaining Longevity: 96 mo
Battery Remaining Percentage: 100 %
Brady Statistic RA Percent Paced: 9 %
Brady Statistic RV Percent Paced: 1 %
Date Time Interrogation Session: 20231116044100
HighPow Impedance: 81 Ohm
Implantable Lead Connection Status: 753985
Implantable Lead Connection Status: 753985
Implantable Lead Connection Status: 753985
Implantable Lead Implant Date: 20190718
Implantable Lead Implant Date: 20190718
Implantable Lead Implant Date: 20190718
Implantable Lead Location: 753858
Implantable Lead Location: 753859
Implantable Lead Location: 753860
Implantable Lead Model: 292
Implantable Lead Model: 4671
Implantable Lead Model: 7740
Implantable Lead Serial Number: 447121
Implantable Lead Serial Number: 694576
Implantable Lead Serial Number: 815914
Implantable Pulse Generator Implant Date: 20190718
Lead Channel Impedance Value: 431 Ohm
Lead Channel Impedance Value: 533 Ohm
Lead Channel Impedance Value: 580 Ohm
Lead Channel Setting Pacing Amplitude: 2 V
Lead Channel Setting Pacing Amplitude: 2.4 V
Lead Channel Setting Pacing Amplitude: 2.6 V
Lead Channel Setting Pacing Pulse Width: 0.4 ms
Lead Channel Setting Pacing Pulse Width: 0.4 ms
Lead Channel Setting Sensing Sensitivity: 0.5 mV
Lead Channel Setting Sensing Sensitivity: 1 mV
Pulse Gen Serial Number: 215046
Zone Setting Status: 755011

## 2022-03-19 ENCOUNTER — Other Ambulatory Visit: Payer: Self-pay | Admitting: Family Medicine

## 2022-03-24 DIAGNOSIS — M5416 Radiculopathy, lumbar region: Secondary | ICD-10-CM | POA: Diagnosis not present

## 2022-03-29 NOTE — Progress Notes (Signed)
Remote ICD transmission.   

## 2022-04-19 ENCOUNTER — Telehealth: Payer: Self-pay

## 2022-04-19 NOTE — Telephone Encounter (Signed)
Patient assistance for Anita Diaz [facility] faxed to [employer] patient assistance [employer]. Fax confirmation received 04/12/22.

## 2022-04-21 ENCOUNTER — Other Ambulatory Visit: Payer: Self-pay | Admitting: Family Medicine

## 2022-04-28 ENCOUNTER — Other Ambulatory Visit: Payer: Self-pay | Admitting: Family Medicine

## 2022-04-28 ENCOUNTER — Other Ambulatory Visit: Payer: Self-pay | Admitting: Cardiology

## 2022-05-01 DIAGNOSIS — M5416 Radiculopathy, lumbar region: Secondary | ICD-10-CM | POA: Diagnosis not present

## 2022-05-01 NOTE — Telephone Encounter (Signed)
**Note De-Identified Anita Diaz Obfuscation** Letter received Rosalind Guido fax from [employer] stating that they have approved the pt for Entresto assistance until 04/24/2023. Pt ID: [id number]  The letter states that they have notified the pt of this approval as well.

## 2022-05-02 ENCOUNTER — Telehealth: Payer: Self-pay | Admitting: Pharmacist

## 2022-05-02 NOTE — Progress Notes (Signed)
Care Management & Coordination Services Pharmacy Team  Reason for Encounter: Hypertension  Contacted patient on 05/02/2022 to discuss hypertension disease state.   Recent office visits:  None  Recent consult visits:  None  Hospital visits:  None in previous 6 months  Medications: Outpatient Encounter Medications as of 05/02/2022  Medication Sig   albuterol (PROVENTIL) (2.5 MG/3ML) 0.083% nebulizer solution Take 3 mLs (2.5 mg total) by nebulization every 6 (six) hours as needed for wheezing or shortness of breath.   atorvastatin (LIPITOR) 40 MG tablet Take 1 tablet by mouth once daily   azelastine (ASTELIN) 0.1 % nasal spray Place 2 sprays into both nostrils 2 (two) times daily.   carvedilol (COREG) 12.5 MG tablet TAKE 1 & 1/2 (ONE & ONE-HALF) TABLETS BY MOUTH TWICE DAILY   clopidogrel (PLAVIX) 75 MG tablet Take 1 tablet by mouth once daily   diazepam (VALIUM) 5 MG tablet TAKE 1 TABLET BY MOUTH EVERY 12 HOURS AS NEEDED FOR ANXIETY   ezetimibe (ZETIA) 10 MG tablet Take 1 tablet (10 mg total) by mouth daily.   furosemide (LASIX) 40 MG tablet TAKE 1 TABLET BY MOUTH ONCE DAILY . APPOINTMENT REQUIRED FOR FUTURE REFILLS   HYDROcodone-acetaminophen (NORCO) 5-325 MG tablet Take 1 tablet by mouth every 6 (six) hours as needed for moderate pain.   levocetirizine (XYZAL) 5 MG tablet Take 5 mg by mouth every evening.   loperamide (IMODIUM A-D) 2 MG tablet Take 4 mg by mouth as needed.   nitrofurantoin, macrocrystal-monohydrate, (MACROBID) 100 MG capsule Take 1 capsule (100 mg total) by mouth 2 (two) times daily.   nitroGLYCERIN (NITROSTAT) 0.4 MG SL tablet PLACE 1 TABLET UNDER THE TONGUE EVERY 5 MINUTES AS NEEDED FOR CHEST PAIN   pantoprazole (PROTONIX) 40 MG tablet Take 1 tablet by mouth once daily   sacubitril-valsartan (ENTRESTO) 24-26 MG Take 1 tablet by mouth 2 (two) times daily.   sertraline (ZOLOFT) 100 MG tablet Take 1 tablet by mouth once daily   No facility-administered encounter  medications on file as of 05/02/2022.    Recent Office Vitals: BP Readings from Last 3 Encounters:  12/14/21 111/72  11/09/21 (!) 100/58  11/02/21 106/68   Pulse Readings from Last 3 Encounters:  12/14/21 61  11/09/21 (!) 59  11/02/21 (!) 50    Wt Readings from Last 3 Encounters:  12/14/21 138 lb 9.6 oz (62.9 kg)  11/09/21 144 lb 9.6 oz (65.6 kg)  11/02/21 144 lb (65.3 kg)     Kidney Function Lab Results  Component Value Date/Time   CREATININE 0.90 02/25/2021 09:35 AM   CREATININE 0.74 01/10/2021 08:06 PM   GFR 67.42 02/25/2021 09:35 AM   GFRNONAA >60 01/10/2021 08:06 PM   GFRAA 65 05/07/2020 08:38 AM       Latest Ref Rng & Units 02/25/2021    9:35 AM 01/10/2021    8:06 PM 07/16/2020   12:05 PM  BMP  Glucose 70 - 99 mg/dL 95  91  99   BUN 6 - 23 mg/dL 19  25  16    Creatinine 0.40 - 1.20 mg/dL 0.90  0.74  0.93   Sodium 135 - 145 mEq/L 141  134  140   Potassium 3.5 - 5.1 mEq/L 4.7  3.8  4.1   Chloride 96 - 112 mEq/L 102  99  103   CO2 19 - 32 mEq/L 30  24  30    Calcium 8.4 - 10.5 mg/dL 9.7  9.0  9.4  Current antihypertensive regimen:  Carvedilol 12.5 mg one tablet twice daily Furosemide 40 mg once daily Entresto 24-26 mg two times daily  Patient verbally confirms she is taking the above medications as directed. Yes  How often are you checking your Blood Pressure? infrequently  she checks her blood pressure in the morning after taking her medication.  Current home BP readings: 113/71 pulse 59 on 05/02/2022   Wrist or arm cuff: arm Caffeine intake: one or two cups of coffee a day, some soda occasionally about twice a week. Salt intake: limits salt intake OTC medications including pseudoephedrine or NSAIDs? No, patient states she will rarely take NSAIDs "once or twice a month."  Any readings above 180/120? No If yes any symptoms of hypertensive emergency? patient denies any symptoms of high blood pressure   What recent interventions/DTPs have been made by  any provider to improve Blood Pressure control since last CPP Visit: Per patient Carvedilol has been decreased to one tablet twice daily instead of one and a half tablet twice daily.  Any recent hospitalizations or ED visits since last visit with CPP? No  What diet changes have been made to improve Blood Pressure Control?  Patient states she started a new diet and has been eating healthier.  What exercise is being done to improve your Blood Pressure Control?  No regular exercise but cleans regularly.  Adherence Review: Is the patient currently on ACE/ARB medication? Yes Does the patient have >5 day gap between last estimated fill dates? No  Started Gabapentin last night per her orthopedic. Patient states she was instructed to start 300 mg at night for a week then increase to twice a day for a week, then increase to three times daily. She denies any side effects at this time.  Star Rating Drugs:  Atorvastatin 40 mg last filled 02/22/2022 90 DS  Entresto 24-26 mg last filled 12/06/2020 90 DS PAP   Future Appointments  Date Time Provider Department Center  05/09/2022 12:15 PM Erroll Luna, [facility] None  06/08/2022  7:00 AM [facility] DEVICE REMOTES [facility]  09/07/2022  7:00 AM [facility] DEVICE REMOTES [facility]  12/07/2022  7:00 AM [facility] DEVICE REMOTES [facility]  03/08/2023  7:00 AM [facility] DEVICE REMOTES [facility]  06/07/2023  7:00 AM [facility] DEVICE REMOTES [facility]   April D Calhoun, [facility] Clinical Pharmacist Assistant [phone]

## 2022-05-04 NOTE — Progress Notes (Signed)
Care Management & Coordination Services Pharmacy Note  05/09/2022 Name:  Anita Diaz MRN:  [id number] DOB:  [birth year]  Summary: PharmD Fu visit.  She is still having some depression.  At this time feels like Zoloft has not helped her symptoms.  All add on therapies are going to be expensive unless she gets assistance which not many have programs for.  BP still on lower end - counseled on monitoring and recording.  Denies any dizziness.  Recently had her beta blocker decreased by cardiology.  Recommendations/Changes made from today's visit: No changes - monitor BP and record  Follow up plan: FU 6 months CMA to FU on BP and meds in 30 days   Subjective: Anita Diaz is an 67 y.o. year old female who is a primary patient of [name], Algis Greenhouse, MD.  The care coordination team was consulted for assistance with disease management and care coordination needs.    Engaged with patient by telephone for follow up visit.  Patient Care Team: [name], MD as PCP - General (Family Medicine) [name], MD as PCP - Cardiology (Cardiology) Edythe Clarity, [facility] as Pharmacist (Pharmacist)  Recent office visits:  None   Recent consult visits:  None   Hospital visits:  None in previous 6 months  Objective:  Lab Results  Component Value Date   CREATININE 0.90 02/25/2021   BUN 19 02/25/2021   GFR 67.42 02/25/2021   GFRNONAA >60 01/10/2021   GFRAA 65 05/07/2020   NA 141 02/25/2021   K 4.7 02/25/2021   CALCIUM 9.7 02/25/2021   CO2 30 02/25/2021   GLUCOSE 95 02/25/2021    Lab Results  Component Value Date/Time   HGBA1C 5.5 01/12/2021 02:19 AM   GFR 67.42 02/25/2021 09:35 AM   GFR 65.10 07/16/2020 12:05 PM    Last diabetic Eye exam: No results found for: "HMDIABEYEEXA"  Last diabetic Foot exam: No results found for: "HMDIABFOOTEX"   Lab Results  Component Value Date   CHOL 140 08/08/2021   HDL 77 08/08/2021   LDLCALC 41 08/08/2021   LDLDIRECT 67 06/08/2021    TRIG 129 08/08/2021   CHOLHDL 1.8 08/08/2021       Latest Ref Rng & Units 08/08/2021    8:05 AM 06/08/2021    3:46 PM 02/25/2021    9:35 AM  Hepatic Function  Total Protein 6.0 - 8.5 g/dL 6.5  7.0  7.5   Albumin 3.8 - 4.8 g/dL 4.5  4.7  4.5   AST 0 - 40 IU/L 17  18  16    ALT 0 - 32 IU/L 50  25  16   Alk Phosphatase 44 - 121 IU/L 73  85  74   Total Bilirubin 0.0 - 1.2 mg/dL 0.6  0.3  0.9   Bilirubin, Direct 0.00 - 0.40 mg/dL 0.18  0.12      Lab Results  Component Value Date/Time   TSH 1.21 02/25/2021 09:35 AM   TSH 1.547 01/11/2021 07:30 PM   TSH 1.200 04/28/2019 08:58 AM       Latest Ref Rng & Units 02/25/2021    9:35 AM 01/10/2021    8:06 PM 07/16/2020   12:05 PM  CBC  WBC 4.0 - 10.5 K/uL 5.9  9.1  6.8   Hemoglobin 12.0 - 15.0 g/dL 12.2  12.0  12.3   Hematocrit 36.0 - 46.0 % 37.3  36.1  37.3   Platelets 150.0 - 400.0 K/uL 327.0  331  375.0  Lab Results  Component Value Date/Time   VD25OH 30.46 08/29/2017 08:37 AM   VITAMINB12 468 03/01/2018 03:27 PM   VITAMINB12 197 (L) 08/29/2017 08:37 AM    Clinical ASCVD: No  The ASCVD Risk score (Arnett DK, et al., 2019) failed to calculate for the following reasons:   The patient has a prior MI or stroke diagnosis       12/14/2021   11:39 AM 11/02/2021    9:39 AM 09/27/2021   11:24 AM  Depression screen PHQ 2/9  Decreased Interest 0 1 0  Down, Depressed, Hopeless 0 1 0  PHQ - 2 Score 0 2 0  Altered sleeping 0 2   Tired, decreased energy 0 2   Change in appetite 0 2   Feeling bad or failure about yourself  0 1   Trouble concentrating 0 1   Moving slowly or fidgety/restless 0 2   Suicidal thoughts 0 1   PHQ-9 Score 0 13      Social History   Tobacco Use  Smoking Status Former   Packs/day: 2.00   Years: 37.00   Total pack years: 74.00   Types: Cigarettes   Quit date: 06/22/2008   Years since quitting: 13.8  Smokeless Tobacco Never   BP Readings from Last 3 Encounters:  12/14/21 111/72  11/09/21 (!) 100/58   11/02/21 106/68   Pulse Readings from Last 3 Encounters:  12/14/21 61  11/09/21 (!) 59  11/02/21 (!) 50   Wt Readings from Last 3 Encounters:  12/14/21 138 lb 9.6 oz (62.9 kg)  11/09/21 144 lb 9.6 oz (65.6 kg)  11/02/21 144 lb (65.3 kg)   BMI Readings from Last 3 Encounters:  12/14/21 26.19 kg/m  11/09/21 27.32 kg/m  11/02/21 27.21 kg/m    Allergies  Allergen Reactions   Sulfa Antibiotics Other (See Comments)    Childhood allergy    Medications Reviewed Today     Reviewed by Erroll Luna, [facility] (Pharmacist) on 05/09/22 at 1450  Med List Status: <None>   Medication Order Taking? Sig Documenting Provider Last Dose Status Informant  albuterol (PROVENTIL) (2.5 MG/3ML) 0.083% nebulizer solution [id number] Yes Take 3 mLs (2.5 mg total) by nebulization every 6 (six) hours as needed for wheezing or shortness of breath. Jeani Sow, MD Taking Active   atorvastatin (LIPITOR) 40 MG tablet [id number] Yes Take 1 tablet by mouth once daily Ardith Dark, MD Taking Active   azelastine (ASTELIN) 0.1 % nasal spray [id number] Yes Place 2 sprays into both nostrils 2 (two) times daily. Ardith Dark, MD Taking Active   carvedilol (COREG) 12.5 MG tablet [id number] Yes TAKE 1 & 1/2 (ONE & ONE-HALF) TABLETS BY MOUTH TWICE DAILY Jake Bathe, MD Taking Active   clopidogrel (PLAVIX) 75 MG tablet [id number] Yes Take 1 tablet by mouth once daily Ardith Dark, MD Taking Active   diazepam (VALIUM) 5 MG tablet [id number] Yes TAKE 1 TABLET BY MOUTH EVERY 12 HOURS AS NEEDED FOR ANXIETY Ardith Dark, MD Taking Active   ezetimibe (ZETIA) 10 MG tablet [id number] Yes Take 1 tablet (10 mg total) by mouth daily. Jake Bathe, MD Taking Active   furosemide (LASIX) 40 MG tablet [id number] Yes TAKE 1 TABLET BY MOUTH ONCE DAILY . APPOINTMENT REQUIRED FOR FUTURE REFILLS Jake Bathe, MD Taking Active   HYDROcodone-acetaminophen [facility]) 5-325 MG tablet [id number] Yes Take 1 tablet by mouth every 6  (six) hours as needed for moderate pain. Ardith Dark, MD Taking  Active   levocetirizine (XYZAL) 5 MG tablet [id number] Yes Take 5 mg by mouth every evening. [provider] Taking Active Multiple Informants  loperamide (IMODIUM A-D) 2 MG tablet [id number] Yes Take 4 mg by mouth as needed. [provider] Taking Active Multiple Informants  nitrofurantoin, macrocrystal-monohydrate, (MACROBID) 100 MG capsule [id number] Yes Take 1 capsule (100 mg total) by mouth 2 (two) times daily. Ardith Dark, MD Taking Active   nitroGLYCERIN (NITROSTAT) 0.4 MG SL tablet [id number] Yes PLACE 1 TABLET UNDER THE TONGUE EVERY 5 MINUTES AS NEEDED FOR CHEST PAIN Jake Bathe, MD Taking Active   pantoprazole (PROTONIX) 40 MG tablet [id number] Yes Take 1 tablet by mouth once daily Ardith Dark, MD Taking Active   sacubitril-valsartan [facility]) 24-26 MG [id number] Yes Take 1 tablet by mouth 2 (two) times daily. 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/2019   SVT (supraventricular tachycardia) 06/17/2019   TMJ syndrome 03/01/2018   Chronic systolic heart failure (HCC) 11/08/2017   B12 deficiency 10/30/2017   Chronic left shoulder pain 08/29/2017   Neck pain 08/29/2017   Other fatigue 08/29/2017   Heart failure with reduced ejection fraction (HCC) 08/01/2017   Osteoarthritis, hand 08/01/2017   Diarrhea 06/13/2017   Chronic pain    Depression, major, single episode, moderate (HCC)    Insomnia    Hyperlipidemia    Hypertension     Immunization History  Administered Date(s)  Administered   Tdap 03/23/2021     Compliance/Adherence/Medication fill history: Care Gaps: DUE FOR AWV  Star-Rating Drugs: Atorvastatin 40mg  02/22/22 90ds  SDOH:  (Social Determinants of Health) assessments and interventions performed: No, done within the past year Financial Resource Strain: Low Risk  (05/23/2021)   Overall Financial Resource Strain (CARDIA)    Difficulty of Paying Living Expenses: Not hard at all    SDOH Interventions    Flowsheet Row Office Visit from 08/05/2021 in [facility] from 10/30/2017 in [facility]  SDOH Interventions    Depression Interventions/Treatment  Medication Currently on Treatment      SDOH Screenings   Food Insecurity: No Food Insecurity (05/23/2021)  Housing: Low Risk  (05/23/2021)  Transportation Needs: No Transportation Needs (05/23/2021)  Depression (PHQ2-9): Low Risk  (12/14/2021)  Recent Concern: Depression (PHQ2-9) - High Risk (11/02/2021)  Financial Resource Strain: Low Risk  (05/23/2021)  Physical Activity: Inactive (05/23/2021)  Social Connections: Moderately Isolated (05/23/2021)  Stress: No Stress Concern Present (05/23/2021)  Tobacco Use: Medium Risk (11/09/2021)    Medication Assistance:  Entrestoobtained through PAP medication assistance program.  Enrollment ends 04/24/23  Medication Access: Within the past 30 days, how often has patient missed a dose of medication? 0 Is a pillbox or other method used to improve adherence? Yes  Factors that may affect medication adherence? financial need Are meds synced by current pharmacy? No  Are meds delivered by current pharmacy? No  Does patient experience delays in picking up medications due to transportation concerns? No   [facility] Services Reviewed: Is patient disadvantaged to  use [facility]?: No  Current Rx insurance plan: [place] Name and location of Current pharmacy:  [facility] [address], [place] - [place]  [address] [place] [address] Phone: [phone] Fax: [phone]  [facility] services reviewed with patient today?: Yes  Patient requests to transfer care to [facility]?: No  Reason patient declined to change pharmacies: Loyalty to other pharmacy/Patient preference   Assessment/Plan    Hypertension (BP goal <130/80) 05/09/22 -Controlled, not assessed today -Current treatment: Carvedilol 12.5mg  BID - Appropriate, Effective, Safe, Accessible -Medications previously tried: lisinopril  -Current home readings: not checking at home -Current exercise habits: minimal organized physical activity, she is active cleaning houses for work -Denies hypotensive/hypertensive symptoms -Educated on BP goals and benefits of medications for prevention of heart attack, stroke and kidney damage; Importance of home blood pressure monitoring; -Recently had this dose reduced.  Denies any recent dizziness.  She has not been checking BP at home but is going to start doing so.  Have asked her to check at various times and record.  Recent office BP at goal but on lower end.   CMA to FU on home BP within 30 days.  Hyperlipidemia: (LDL goal < 70) 11/01/21 - not assessed today Controlled, LDL 41 in April 2023 -Current treatment: Atorvastatin 40mg  daily - Appropriate, Effective, Safe, Accessible -Medications previously tried: none noted  -Educated on Cholesterol goals;  Benefits of statin for ASCVD risk reduction; Importance of limiting foods high in cholesterol;  Denies any cramping or myalgias -Recommended to continue current medication Since starting Lipitor her LDL has decreased to goal < 70!  Tolerating the medication well. Continue adherence to medication, discussed importance of reduction of CV risk. No changes at this time, continue routine lipid screenings.  Heart Failure (Goal: manage symptoms and prevent exacerbations) -Controlled, not assessed  today -Last ejection fraction: 45-50% -Current treatment: Furosemide 40mg  daily - Appropriate, Effective, Safe, Accessible Carvedilol 12.5mg  BID - Appropriate, Effective, Safe, Accessible Entresto 24-26mg  BID - Appropriate, Effective, Safe, Accessible -Medications previously tried: none noted  -Current home BP/HR readings: not checking at home -Educated on Benefits of medications for managing symptoms and prolonging life Importance of weighing daily; if you gain more than 3 pounds in one day or 5 pounds in one week, contact providers Importance of blood pressure control -Recommended to continue current medication Patient is monitoring fluid status, she denies any swelling or SOB at this time.  Has gained some weight but attributes this to dietary sources and not fluid. Continue to monitor weight, contact providers with any swelling.  Depression/Anxiety (Goal: Reduce symptoms) 05/09/22 -Not ideally controlled - reports Zoloft is not really doing much -Current treatment: Sertraline 100mg  - Appropriate, Query effective, -Medications previously tried/failed: Latuda ($), Abilify, Wellbutrin, citalopram, fluoxetine    12/14/2021   11:39 AM 11/02/2021    9:39 AM 09/27/2021   11:24 AM  PHQ9 SCORE ONLY  PHQ-9 Total Score 0 13 0   -GAD7: No flowsheet data found. -Educated on Benefits of medication for symptom control Switched from Fluoxetine to Sertraline in July - has not noticed much difference since changing.  Still has days with little motivation - reports she just feels "blah."  History of Latuda use but had to stop due to copay.  Unfortunately a lot of the add on medications are going to be expensive.  Could consider restarting Abilify and titrating up to 15mg  if she needs it.  Will have her talk to Dr. [name] for appointment if she  wants to discuss. No changes at this time.  Initial Visit No longer taking her Abilify or Wellbutrin.  Reports that her Kasandra Knudsen was expensive this is why she had  to stop.  No patient assistance available for patients on Medicare.  I feel she may benefit from restarting Abilify due to ongoing lingering depression.  Insomnia (Goal: Improve sleep) -Not ideally controlled, not assessed today -Current treatment  Diazepam 5mg  q12h prn anxiety -Medications previously tried: trazodone -Sleep is off and on  -Recommended to continue current medication Possible untreated depression leading to sleep disturbances.  Patient Goals/Self-Care Activities Patient will:  - take medications as prescribed as evidenced by patient report and record review check blood pressure as able, document, and provide at future appointments weigh daily, and contact provider if weight gain of 3 lbs in one day or 5 lbs in one week  Follow Up Plan: The care management team will reach out to the patient again over the next 180 days.          , PharmD Clinical Pharmacist  [facility] [phone]

## 2022-05-05 ENCOUNTER — Telehealth: Payer: Self-pay | Admitting: Pharmacist

## 2022-05-05 NOTE — Progress Notes (Signed)
Care Management & Coordination Services Pharmacy Team  Reason for Encounter: Appointment Reminder   Unsuccessful attempt to reach patient. Left patient message reminding patient of appointment.  Attempted to contact patient to confirm telephone appointment with Leata Mouse PharmD, on 05/09/2022 at 12 pm.  April Daiva Huge, [place] Pharmacist Assistant [phone]

## 2022-05-09 ENCOUNTER — Ambulatory Visit: Payer: Medicare Other | Admitting: Pharmacist

## 2022-05-12 DIAGNOSIS — M25552 Pain in left hip: Secondary | ICD-10-CM | POA: Diagnosis not present

## 2022-05-12 DIAGNOSIS — M545 Low back pain, unspecified: Secondary | ICD-10-CM | POA: Diagnosis not present

## 2022-05-15 NOTE — Progress Notes (Signed)
Did note that [employer] has a patient assistance program available to medicare patients that she would likely qualify for.  Beverly Milch, PharmD Clinical Pharmacist  [facility] [phone]

## 2022-05-22 ENCOUNTER — Encounter: Payer: Self-pay | Admitting: Family Medicine

## 2022-05-22 ENCOUNTER — Ambulatory Visit (INDEPENDENT_AMBULATORY_CARE_PROVIDER_SITE_OTHER): Payer: Medicare Other | Admitting: Family Medicine

## 2022-05-22 VITALS — BP 144/74 | HR 51 | Temp 97.8°F | Ht 61.0 in | Wt 139.6 lb

## 2022-05-22 DIAGNOSIS — F321 Major depressive disorder, single episode, moderate: Secondary | ICD-10-CM

## 2022-05-22 DIAGNOSIS — R109 Unspecified abdominal pain: Secondary | ICD-10-CM | POA: Diagnosis not present

## 2022-05-22 DIAGNOSIS — I10 Essential (primary) hypertension: Secondary | ICD-10-CM

## 2022-05-22 LAB — URINALYSIS, ROUTINE W REFLEX MICROSCOPIC
Bilirubin Urine: NEGATIVE
Ketones, ur: NEGATIVE
Nitrite: NEGATIVE
Specific Gravity, Urine: >=1.03 — AB (ref 1.000–1.030)
Urine Glucose: NEGATIVE
Urobilinogen, UA: 1 (ref 0.0–1.0)
pH: 6 (ref 5.0–8.0)

## 2022-05-22 MED ORDER — DULOXETINE HCL 60 MG PO CPEP: 60.0000 mg | ORAL_CAPSULE | Freq: Every day | ORAL | 3 refills | Status: DC

## 2022-05-22 NOTE — Progress Notes (Signed)
Anita Diaz is a 67 y.o. female who presents today for an office visit.  Assessment/Plan:  New/Acute Problems: Back Pain No red flags.  Likely muscular strain based on exam.  Need to avoid NSAIDs due to cardiac history.  We discussed home exercises and handout was given.  She can also use heating pad to the area.  Can also use Tylenol.  She will let me know if not proving in the next 1 to 2 weeks and we can refer to PT or sports medicine.  Chronic Problems Addressed Today: Depression, major, single episode, moderate ([place]) PHQ up to 7.  She is interested in switching antidepressant medications.  We discussed options including adding on Wellbutrin versus trial of different antidepressant.  She has tried several SSRIs in the past including Zoloft which she is currently on, Celexa, and Prozac and has not found that any of these have been particularly effective.  We will switch to Cymbalta.  Hopefully this will help some with her chronic pain issues as well.  We discussed potential side effects of medication.  She will follow-up me in a couple weeks via MyChart.  If symptoms are still not adequately controlled would consider adding on Wellbutrin at that point.  Hypertension Blood pressure mildly elevated today though she has been at goal at her last office visit.  Will continue current regimen Entresto 24-26 twice daily and Coreg 18.75 mg twice daily.  She will monitor at home and let us know if persistently elevated.     Subjective:  HPI:  A/P for status of chronic conditions.  Patient is here for follow-up today. Her main concern is depression.   She was last seen here about 5 months ago.  Since our last visit, she has been doing well though does note her mood has been decreased.  She is currently on Zoloft 100 mg daily.  She is not sure if it is effective for controlling her mood.  She is interested in switching to alternative antidepressant.  She has been on several antidepressants in the  past including Zoloft, Wellbutrin, Celexa, Prozac.  We have also tried augmentation with Abilify and Latuda which she could not tolerate due to side effects.   She also had some right flank pain for the last several days.  She is concerned about possible kidney issues.  She has had a UTI a few months ago.  No dysuria.  No hematuria.  No fever or chills.  No nausea or vomiting.  No specific treatments tried.     05/22/2022    7:26 AM 12/14/2021   11:39 AM 11/02/2021    9:39 AM  Depression screen PHQ 2/9  Decreased Interest 0 0 1  Down, Depressed, Hopeless 0 0 1  PHQ - 2 Score 0 0 2  Altered sleeping 3 0 2  Tired, decreased energy 3 0 2  Change in appetite 1 0 2  Feeling bad or failure about yourself  0 0 1  Trouble concentrating 0 0 1  Moving slowly or fidgety/restless 0 0 2  Suicidal thoughts 0 0 1  PHQ-9 Score 7 0 13  Difficult doing work/chores Not difficult at all              Objective:  Physical Exam: BP (!) 144/74   Pulse (!) 51   Temp 97.8 F (36.6 C) (Temporal)   Ht 5\' 1"  (1.549 m)   Wt 139 lb 9.6 oz (63.3 kg)   SpO2 98%   BMI  26.38 kg/m   Gen: No acute distress, resting comfortably CV: Regular rate and rhythm with no murmurs appreciated Pulm: Normal work of breathing, clear to auscultation bilaterally with no crackles, wheezes, or rhonchi MSK: -Back without deformities.  Tenderness palpation along right lower lumbar paraspinal muscles.  Neurovascular intact distally.   Neuro: Grossly normal, moves all extremities Psych: Normal affect and thought content      Tegh Franek M. [name], MD 05/22/2022 7:49 AM

## 2022-05-22 NOTE — Assessment & Plan Note (Signed)
PHQ up to 7.  She is interested in switching antidepressant medications.  We discussed options including adding on Wellbutrin versus trial of different antidepressant.  She has tried several SSRIs in the past including Zoloft which she is currently on, Celexa, and Prozac and has not found that any of these have been particularly effective.  We will switch to Cymbalta.  Hopefully this will help some with her chronic pain issues as well.  We discussed potential side effects of medication.  She will follow-up me in a couple weeks via MyChart.  If symptoms are still not adequately controlled would consider adding on Wellbutrin at that point.

## 2022-05-22 NOTE — Assessment & Plan Note (Signed)
Blood pressure mildly elevated today though she has been at goal at her last office visit.  Will continue current regimen Entresto 24-26 twice daily and Coreg 18.75 mg twice daily.  She will monitor at home and let us know if persistently elevated.

## 2022-05-22 NOTE — Patient Instructions (Signed)
It was very nice to see you today!  Please stop the Zoloft.  Start the Cymbalta.  I hope this will help with your mood.  Please send a message in a week or 2 to let me know how this is working for you.  We will check a urine sample to make sure that you do not have a kidney stone or other urinary issues causing your back pain.  He probably have a muscular strain.  Please use a heating pad to the area.  You can also use Tylenol.  Work on the exercises.  Let me know if not improving.  Take care, Dr [name]  PLEASE NOTE:  If you had any lab tests, please let us know if you have not heard back within a few days. You may see your results on mychart before we have a chance to review them but we will give you a call once they are reviewed by [place].   If we ordered any referrals today, please let us know if you have not heard from their office within the next week.   If you had any urgent prescriptions sent in today, please check with the pharmacy within an hour of our visit to make sure the prescription was transmitted appropriately.   Please try these tips to maintain a healthy lifestyle:  Eat at least 3 REAL meals and 1-2 snacks per day.  Aim for no more than 5 hours between eating.  If you eat breakfast, please do so within one hour of getting up.   Each meal should contain half fruits/vegetables, one quarter protein, and one quarter carbs (no bigger than a computer mouse)  Cut down on sweet beverages. This includes juice, soda, and sweet tea.   Drink at least 1 glass of water with each meal and aim for at least 8 glasses per day  Exercise at least 150 minutes every week.

## 2022-05-24 LAB — URINE CULTURE
MICRO NUMBER:: 14486420
SPECIMEN QUALITY:: ADEQUATE

## 2022-05-25 NOTE — Progress Notes (Signed)
Please inform patient of the following:  Urine culture looks like she may have a UTI as well.  Please start Keflex 500 mg twice daily x 7 days.  She had some blood in the urine as well.  This is probably due to her infection but I would like for her to come back in a couple of weeks to recheck.  Please place future order for urinalysis and urine culture.

## 2022-05-29 ENCOUNTER — Other Ambulatory Visit: Payer: Self-pay

## 2022-05-29 DIAGNOSIS — N390 Urinary tract infection, site not specified: Secondary | ICD-10-CM

## 2022-05-29 DIAGNOSIS — R3915 Urgency of urination: Secondary | ICD-10-CM

## 2022-05-29 MED ORDER — CEPHALEXIN 500 MG PO CAPS: 500.0000 mg | ORAL_CAPSULE | Freq: Four times a day (QID) | ORAL | 0 refills | Status: DC

## 2022-05-31 DIAGNOSIS — M25552 Pain in left hip: Secondary | ICD-10-CM | POA: Diagnosis not present

## 2022-06-07 ENCOUNTER — Telehealth: Payer: Self-pay | Admitting: Family Medicine

## 2022-06-07 ENCOUNTER — Other Ambulatory Visit: Payer: Self-pay | Admitting: Family Medicine

## 2022-06-07 NOTE — Telephone Encounter (Signed)
ERROR

## 2022-06-09 ENCOUNTER — Encounter: Payer: Self-pay | Admitting: Family Medicine

## 2022-06-09 ENCOUNTER — Ambulatory Visit (INDEPENDENT_AMBULATORY_CARE_PROVIDER_SITE_OTHER): Payer: Medicare Other | Admitting: Family Medicine

## 2022-06-09 VITALS — BP 125/76 | HR 56 | Temp 98.0°F | Ht 61.0 in | Wt 137.2 lb

## 2022-06-09 DIAGNOSIS — R3915 Urgency of urination: Secondary | ICD-10-CM

## 2022-06-09 DIAGNOSIS — I10 Essential (primary) hypertension: Secondary | ICD-10-CM | POA: Diagnosis not present

## 2022-06-09 DIAGNOSIS — N390 Urinary tract infection, site not specified: Secondary | ICD-10-CM | POA: Diagnosis not present

## 2022-06-09 DIAGNOSIS — E538 Deficiency of other specified B group vitamins: Secondary | ICD-10-CM | POA: Diagnosis not present

## 2022-06-09 DIAGNOSIS — R739 Hyperglycemia, unspecified: Secondary | ICD-10-CM | POA: Diagnosis not present

## 2022-06-09 DIAGNOSIS — K429 Umbilical hernia without obstruction or gangrene: Secondary | ICD-10-CM

## 2022-06-09 DIAGNOSIS — E7800 Pure hypercholesterolemia, unspecified: Secondary | ICD-10-CM | POA: Diagnosis not present

## 2022-06-09 LAB — URINALYSIS, ROUTINE W REFLEX MICROSCOPIC
Bilirubin Urine: NEGATIVE
Hgb urine dipstick: NEGATIVE
Ketones, ur: NEGATIVE
Nitrite: NEGATIVE
Specific Gravity, Urine: 1.01 (ref 1.000–1.030)
Total Protein, Urine: NEGATIVE
Urine Glucose: NEGATIVE
Urobilinogen, UA: 0.2 (ref 0.0–1.0)
pH: 6 (ref 5.0–8.0)

## 2022-06-09 LAB — COMPREHENSIVE METABOLIC PANEL
ALT: 34 U/L (ref 0–35)
AST: 17 U/L (ref 0–37)
Albumin: 4.6 g/dL (ref 3.5–5.2)
Alkaline Phosphatase: 64 U/L (ref 39–117)
BUN: 30 mg/dL — ABNORMAL HIGH (ref 6–23)
CO2: 30 mEq/L (ref 19–32)
Calcium: 9.3 mg/dL (ref 8.4–10.5)
Chloride: 100 mEq/L (ref 96–112)
Creatinine, Ser: 0.9 mg/dL (ref 0.40–1.20)
GFR: 66.81 mL/min (ref >60.00)
Glucose, Bld: 103 mg/dL — ABNORMAL HIGH (ref 70–99)
Potassium: 4.2 mEq/L (ref 3.5–5.1)
Sodium: 139 mEq/L (ref 135–145)
Total Bilirubin: 0.8 mg/dL (ref 0.2–1.2)
Total Protein: 7.2 g/dL (ref 6.0–8.3)

## 2022-06-09 LAB — LIPID PANEL
Cholesterol: 163 mg/dL (ref 0–200)
HDL: 73.6 mg/dL (ref >39.00)
LDL Cholesterol: 65 mg/dL (ref 0–99)
NonHDL: 89.71
Total CHOL/HDL Ratio: 2
Triglycerides: 123 mg/dL (ref 0.0–149.0)
VLDL: 24.6 mg/dL (ref 0.0–40.0)

## 2022-06-09 LAB — TSH: TSH: 1.09 u[IU]/mL (ref 0.35–5.50)

## 2022-06-09 LAB — HEMOGLOBIN A1C: Hgb A1c MFr Bld: 6.3 % (ref 4.6–6.5)

## 2022-06-09 LAB — VITAMIN B12: Vitamin B-12: 201 pg/mL — ABNORMAL LOW (ref 211–911)

## 2022-06-09 LAB — CBC
HCT: 39.6 % (ref 36.0–46.0)
Hemoglobin: 13 g/dL (ref 12.0–15.0)
MCHC: 32.9 g/dL (ref 30.0–36.0)
MCV: 87.1 fl (ref 78.0–100.0)
Platelets: 430 10*3/uL — ABNORMAL HIGH (ref 150.0–400.0)
RBC: 4.55 Mil/uL (ref 3.87–5.11)
RDW: 14.1 % (ref 11.5–15.5)
WBC: 12.2 10*3/uL — ABNORMAL HIGH (ref 4.0–10.5)

## 2022-06-09 MED ORDER — AMOXICILLIN 875 MG PO TABS: 875.0000 mg | ORAL_TABLET | Freq: Two times a day (BID) | ORAL | 0 refills | Status: DC

## 2022-06-09 NOTE — Assessment & Plan Note (Signed)
Blood pressure at goal today.  Continue Entresto 24-26 twice daily and Coreg 18.75 mg twice daily.  Check labs.

## 2022-06-09 NOTE — Patient Instructions (Signed)
It was very nice to see you today!  We will check a urine sample to make sure that your infection is clear.  Please start the amoxicillin.  We may need to get a CT scan to look for kidney stones if your infection is cleared and you are still having symptoms.  You have an umbilical hernia.  Please let me know when you would like to have a referral to see a surgeon for this.  Please continue to work on diet and exercise.  We will check blood work today.  Take care, Dr [name]  PLEASE NOTE:  If you had any lab tests, please let us know if you have not heard back within a few days. You may see your results on mychart before we have a chance to review them but we will give you a call once they are reviewed by [place].   If we ordered any referrals today, please let us know if you have not heard from their office within the next week.   If you had any urgent prescriptions sent in today, please check with the pharmacy within an hour of our visit to make sure the prescription was transmitted appropriately.   Please try these tips to maintain a healthy lifestyle:  Eat at least 3 REAL meals and 1-2 snacks per day.  Aim for no more than 5 hours between eating.  If you eat breakfast, please do so within one hour of getting up.   Each meal should contain half fruits/vegetables, one quarter protein, and one quarter carbs (no bigger than a computer mouse)  Cut down on sweet beverages. This includes juice, soda, and sweet tea.   Drink at least 1 glass of water with each meal and aim for at least 8 glasses per day  Exercise at least 150 minutes every week.

## 2022-06-09 NOTE — Assessment & Plan Note (Signed)
No red flags.  Easily reducible on exam today.  We did discuss referral to see surgeon however she deferred.  Discussed warning signs and reasons to return to care.

## 2022-06-09 NOTE — Assessment & Plan Note (Signed)
Follows with cardiology.  Currently on Zetia 10 mg daily and Lipitor 40 mg daily.  Will check lipids today.  Cardiology would like for her LDL to be less than 55.

## 2022-06-09 NOTE — Progress Notes (Signed)
   Anita Diaz is a 67 y.o. female who presents today for an office visit.  Assessment/Plan:  New/Acute Problems: Back Pain / Hematuria Recent urine culture was positive for group B strep.  She is still having symptoms despite treatment with Keflex.  Will switch to amoxicillin and recheck urinalysis and urine culture today.  Patient states that her current symptoms are consistent with her previous UTIs.  If urine culture is negative and still has persistent symptoms including hematuria will need to check abdominal CT scan to rule out nephrolithiasis.  Will check labs today.  Encouraged hydration.  We discussed reasons return to care.  Chronic Problems Addressed Today: Umbilical hernia No red flags.  Easily reducible on exam today.  We did discuss referral to see surgeon however she deferred.  Discussed warning signs and reasons to return to care.  Hypertension Blood pressure at goal today.  Continue Entresto 24-26 twice daily and Coreg 18.75 mg twice daily.  Check labs.  Hyperlipidemia Follows with cardiology.  Currently on Zetia 10 mg daily and Lipitor 40 mg daily.  Will check lipids today.  Cardiology would like for her LDL to be less than 55.  B12 deficiency Check B12.      Subjective:  HPI:  Patient here today for follow-up.  We saw her a couple weeks ago for back pain.  She was concerned for possible UTI.  Urine culture was positive for group B strep.  She was started on Keflex 500 mg twice daily for 7 days.  Symptoms persisted despite treatment.  That has been about the same over the last week or so.  Still having back pain.  No fevers or chills.  Some nausea.  Some fatigue.  Poor appetite.  She is also having some issues with abdominal bloating.  This is going on for several months.  No constipation or diarrhea.  No obvious aggravating or alleviating factors.  Worse throughout the day.        Objective:  Physical Exam: BP 125/76   Pulse (!) 56   Temp 98 F (36.7 C)  (Temporal)   Ht 5' 1"$  (1.549 m)   Wt 137 lb 3.2 oz (62.2 kg)   SpO2 98%   BMI 25.92 kg/m   Gen: No acute distress, resting comfortably CV: Regular rate and rhythm with no murmurs appreciated Pulm: Normal work of breathing, clear to auscultation bilaterally with no crackles, wheezes, or rhonchi GI: Umbilical hernia present.  Bowel sounds present.  Soft, nontender, nondistended.  Hernia easily reducible. MSK: - No CVA tenderness Neuro: Grossly normal, moves all extremities Psych: Normal affect and thought content      Sekou Zuckerman M. [name], MD 06/09/2022 9:13 AM

## 2022-06-09 NOTE — Assessment & Plan Note (Signed)
Check B12 

## 2022-06-10 LAB — URINE CULTURE
MICRO NUMBER:: 14576329
SPECIMEN QUALITY:: ADEQUATE

## 2022-06-12 ENCOUNTER — Other Ambulatory Visit: Payer: Self-pay | Admitting: *Deleted

## 2022-06-12 ENCOUNTER — Other Ambulatory Visit: Payer: Medicare Other

## 2022-06-12 ENCOUNTER — Encounter: Payer: Self-pay | Admitting: Family Medicine

## 2022-06-12 DIAGNOSIS — R7303 Prediabetes: Secondary | ICD-10-CM | POA: Insufficient documentation

## 2022-06-12 DIAGNOSIS — D72829 Elevated white blood cell count, unspecified: Secondary | ICD-10-CM

## 2022-06-12 NOTE — Progress Notes (Signed)
Please inform patient of the following:  Her urine culture was negative.  This means that the UTI she had should have been eradicated.  She should let us know if her back pain is not improving.  Her cholesterol levels are at goal.  Her A1c is elevated compared to last year.  She is now in the prediabetic range.  We do not need to start meds for this however she should continue to work on diet and exercise and we can recheck this in 6 to 12 months.  Her B12 level is low.  Recommend starting 1000 mcg once daily.  We should recheck this in 3 to 6 months.  Her white blood cell count was elevated.  This was probably due to her recent infection.  Recommend that she come back this week to recheck CBC.  Please place future order.  We can recheck everything else in 6 to 12 months.

## 2022-06-12 NOTE — Progress Notes (Signed)
If she is still having pain recommend we get non contrast CT scan abdomen and pelvis to rule out kidney stone.  Algis Greenhouse. [name], MD 06/12/2022 1:11 PM

## 2022-06-13 ENCOUNTER — Telehealth: Payer: Self-pay | Admitting: Family Medicine

## 2022-06-13 NOTE — Telephone Encounter (Signed)
Contacted Anita Diaz to schedule their annual wellness visit. Appointment made for 06/19/2022.  [place] Direct Dial [phone]

## 2022-06-14 ENCOUNTER — Other Ambulatory Visit: Payer: Self-pay | Admitting: Family Medicine

## 2022-06-14 ENCOUNTER — Other Ambulatory Visit: Payer: Self-pay

## 2022-06-14 ENCOUNTER — Other Ambulatory Visit: Payer: Self-pay | Admitting: Cardiology

## 2022-06-14 DIAGNOSIS — D72828 Other elevated white blood cell count: Secondary | ICD-10-CM

## 2022-06-15 ENCOUNTER — Ambulatory Visit (INDEPENDENT_AMBULATORY_CARE_PROVIDER_SITE_OTHER): Payer: Medicare Other

## 2022-06-15 DIAGNOSIS — I428 Other cardiomyopathies: Secondary | ICD-10-CM

## 2022-06-15 LAB — CUP PACEART REMOTE DEVICE CHECK
Battery Remaining Longevity: 96 mo
Battery Remaining Percentage: 100 %
Brady Statistic RA Percent Paced: 9 %
Brady Statistic RV Percent Paced: 1 %
Date Time Interrogation Session: 20240222044100
HighPow Impedance: 85 Ohm
Implantable Lead Connection Status: 753985
Implantable Lead Connection Status: 753985
Implantable Lead Connection Status: 753985
Implantable Lead Implant Date: 20190718
Implantable Lead Implant Date: 20190718
Implantable Lead Implant Date: 20190718
Implantable Lead Location: 753858
Implantable Lead Location: 753859
Implantable Lead Location: 753860
Implantable Lead Model: 292
Implantable Lead Model: 4671
Implantable Lead Model: 7740
Implantable Lead Serial Number: 447121
Implantable Lead Serial Number: 694576
Implantable Lead Serial Number: 815914
Implantable Pulse Generator Implant Date: 20190718
Lead Channel Impedance Value: 453 Ohm
Lead Channel Impedance Value: 454 Ohm
Lead Channel Impedance Value: 550 Ohm
Lead Channel Setting Pacing Amplitude: 2 V
Lead Channel Setting Pacing Amplitude: 2.4 V
Lead Channel Setting Pacing Amplitude: 2.6 V
Lead Channel Setting Pacing Pulse Width: 0.4 ms
Lead Channel Setting Pacing Pulse Width: 0.4 ms
Lead Channel Setting Sensing Sensitivity: 0.5 mV
Lead Channel Setting Sensing Sensitivity: 1 mV
Pulse Gen Serial Number: 215046
Zone Setting Status: 755011

## 2022-06-19 ENCOUNTER — Ambulatory Visit (INDEPENDENT_AMBULATORY_CARE_PROVIDER_SITE_OTHER): Payer: Medicare Other

## 2022-06-19 ENCOUNTER — Other Ambulatory Visit (INDEPENDENT_AMBULATORY_CARE_PROVIDER_SITE_OTHER): Payer: Medicare Other

## 2022-06-19 VITALS — BP 104/62 | HR 74 | Temp 97.3°F | Wt 139.4 lb

## 2022-06-19 DIAGNOSIS — Z0000 Encounter for general adult medical examination without abnormal findings: Secondary | ICD-10-CM | POA: Diagnosis not present

## 2022-06-19 DIAGNOSIS — Z1211 Encounter for screening for malignant neoplasm of colon: Secondary | ICD-10-CM | POA: Diagnosis not present

## 2022-06-19 DIAGNOSIS — D72828 Other elevated white blood cell count: Secondary | ICD-10-CM

## 2022-06-19 LAB — CBC WITH DIFFERENTIAL/PLATELET
Basophils Absolute: 0.1 10*3/uL (ref 0.0–0.1)
Basophils Relative: 1 % (ref 0.0–3.0)
Eosinophils Absolute: 0.1 10*3/uL (ref 0.0–0.7)
Eosinophils Relative: 1.7 % (ref 0.0–5.0)
HCT: 36.2 % (ref 36.0–46.0)
Hemoglobin: 12.2 g/dL (ref 12.0–15.0)
Lymphocytes Relative: 16.3 % (ref 12.0–46.0)
Lymphs Abs: 1.4 10*3/uL (ref 0.7–4.0)
MCHC: 33.6 g/dL (ref 30.0–36.0)
MCV: 87 fl (ref 78.0–100.0)
Monocytes Absolute: 0.5 10*3/uL (ref 0.1–1.0)
Monocytes Relative: 6.4 % (ref 3.0–12.0)
Neutro Abs: 6.3 10*3/uL (ref 1.4–7.7)
Neutrophils Relative %: 74.6 % (ref 43.0–77.0)
Platelets: 292 10*3/uL (ref 150.0–400.0)
RBC: 4.16 Mil/uL (ref 3.87–5.11)
RDW: 14 % (ref 11.5–15.5)
WBC: 8.4 10*3/uL (ref 4.0–10.5)

## 2022-06-19 NOTE — Progress Notes (Signed)
Please inform patient of the following:  White blood cell count is back to normal. Do not need to do any further testing at this point. We can recheck next time she is here for an office visit.
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
## 2022-06-19 NOTE — Patient Instructions (Signed)
Anita Diaz , Thank you for taking time to come for your Medicare Wellness Visit. I appreciate your ongoing commitment to your health goals. Please review the following plan we discussed and let me know if I can assist you in the future.   These are the goals we discussed:  Goals      Patient Stated     Lose weight      Track and Manage My Symptoms-Depression     Timeframe:  Long-Range Goal Priority:  High Start Date:  05/03/21                           Expected End Date:  10/31/21                     Follow Up Date 08/01/21    - develop a personal safety plan - have a plan for how to handle bad days - spend time or talk with others at least 2 to 3 times per week - watch for early signs of feeling worse    Why is this important?   Keeping track of your progress will help your treatment team find the right mix of medicine and therapy for you.  Write in your journal every day.  Day-to-day changes in depression symptoms are normal. It may be more helpful to check your progress at the end of each week instead of every day.     Notes:         This is a list of the screening recommended for you and due dates:  Health Maintenance  Topic Date Due   Screening for Lung Cancer  Never done   Zoster (Shingles) Vaccine (1 of 2) 08/21/2022*   Colon Cancer Screening  05/23/2023*   DEXA scan (bone density measurement)  06/09/2023   Medicare Annual Wellness Visit  06/20/2023   Mammogram  08/11/2023   DTaP/Tdap/Td vaccine (2 - Td or Tdap) 03/24/2031   Pneumonia Vaccine  Completed   Hepatitis C Screening: USPSTF Recommendation to screen - Ages 31-79 yo.  Completed   HPV Vaccine  Aged Out   Flu Shot  Discontinued   COVID-19 Vaccine  Discontinued  *Topic was postponed. The date shown is not the original due date.    Advanced directives: Advance directive discussed with you today. Even though you declined this today please call our office should you change your mind and we can give you the  proper paperwork for you to fill out.  Conditions/risks identified: none at this time   Next appointment: Follow up in one year for your annual wellness visit    Preventive Care 65 Years and Older, Female Preventive care refers to lifestyle choices and visits with your health care provider that can promote health and wellness. What does preventive care include? A yearly physical exam. This is also called an annual well check. Dental exams once or twice a year. Routine eye exams. Ask your health care provider how often you should have your eyes checked. Personal lifestyle choices, including: Daily care of your teeth and gums. Regular physical activity. Eating a healthy diet. Avoiding tobacco and drug use. Limiting alcohol use. Practicing safe sex. Taking low-dose aspirin every day. Taking vitamin and mineral supplements as recommended by your health care provider. What happens during an annual well check? The services and screenings done by your health care provider during your annual well check will depend on your age, overall health, lifestyle  risk factors, and family history of disease. Counseling  Your health care provider may ask you questions about your: Alcohol use. Tobacco use. Drug use. Emotional well-being. Home and relationship well-being. Sexual activity. Eating habits. History of falls. Memory and ability to understand (cognition). Work and work Statistician. Reproductive health. Screening  You may have the following tests or measurements: Height, weight, and BMI. Blood pressure. Lipid and cholesterol levels. These may be checked every 5 years, or more frequently if you are over 68 years old. Skin check. Lung cancer screening. You may have this screening every year starting at age 47 if you have a 30-pack-year history of smoking and currently smoke or have quit within the past 15 years. Fecal occult blood test (FOBT) of the stool. You may have this test every  year starting at age 88. Flexible sigmoidoscopy or colonoscopy. You may have a sigmoidoscopy every 5 years or a colonoscopy every 10 years starting at age 62. Hepatitis C blood test. Hepatitis B blood test. Sexually transmitted disease (STD) testing. Diabetes screening. This is done by checking your blood sugar (glucose) after you have not eaten for a while (fasting). You may have this done every 1-3 years. Bone density scan. This is done to screen for osteoporosis. You may have this done starting at age 102. Mammogram. This may be done every 1-2 years. Talk to your health care provider about how often you should have regular mammograms. Talk with your health care provider about your test results, treatment options, and if necessary, the need for more tests. Vaccines  Your health care provider may recommend certain vaccines, such as: Influenza vaccine. This is recommended every year. Tetanus, diphtheria, and acellular pertussis (Tdap, Td) vaccine. You may need a Td booster every 10 years. Zoster vaccine. You may need this after age 2. Pneumococcal 13-valent conjugate (PCV13) vaccine. One dose is recommended after age 81. Pneumococcal polysaccharide (PPSV23) vaccine. One dose is recommended after age 45. Talk to your health care provider about which screenings and vaccines you need and how often you need them. This information is not intended to replace advice given to you by your health care provider. Make sure you discuss any questions you have with your health care provider. Document Released: 05/07/2015 Document Revised: 12/29/2015 Document Reviewed: 02/09/2015 Elsevier Interactive Patient Education  2017 [place] Prevention in the Home Falls can cause injuries. They can happen to people of all ages. There are many things you can do to make your home safe and to help prevent falls. What can I do on the outside of my home? Regularly fix the edges of walkways and driveways and  fix any cracks. Remove anything that might make you trip as you walk through a door, such as a raised step or threshold. Trim any bushes or trees on the path to your home. Use bright outdoor lighting. Clear any walking paths of anything that might make someone trip, such as rocks or tools. Regularly check to see if handrails are loose or broken. Make sure that both sides of any steps have handrails. Any raised decks and porches should have guardrails on the edges. Have any leaves, snow, or ice cleared regularly. Use sand or salt on walking paths during winter. Clean up any spills in your garage right away. This includes oil or grease spills. What can I do in the bathroom? Use night lights. Install grab bars by the toilet and in the tub and shower. Do not use towel bars as grab  bars. Use non-skid mats or decals in the tub or shower. If you need to sit down in the shower, use a plastic, non-slip stool. Keep the floor dry. Clean up any water that spills on the floor as soon as it happens. Remove soap buildup in the tub or shower regularly. Attach bath mats securely with double-sided non-slip rug tape. Do not have throw rugs and other things on the floor that can make you trip. What can I do in the bedroom? Use night lights. Make sure that you have a light by your bed that is easy to reach. Do not use any sheets or blankets that are too big for your bed. They should not hang down onto the floor. Have a firm chair that has side arms. You can use this for support while you get dressed. Do not have throw rugs and other things on the floor that can make you trip. What can I do in the kitchen? Clean up any spills right away. Avoid walking on wet floors. Keep items that you use a lot in easy-to-reach places. If you need to reach something above you, use a strong step stool that has a grab bar. Keep electrical cords out of the way. Do not use floor polish or wax that makes floors slippery. If you  must use wax, use non-skid floor wax. Do not have throw rugs and other things on the floor that can make you trip. What can I do with my stairs? Do not leave any items on the stairs. Make sure that there are handrails on both sides of the stairs and use them. Fix handrails that are broken or loose. Make sure that handrails are as long as the stairways. Check any carpeting to make sure that it is firmly attached to the stairs. Fix any carpet that is loose or worn. Avoid having throw rugs at the top or bottom of the stairs. If you do have throw rugs, attach them to the floor with carpet tape. Make sure that you have a light switch at the top of the stairs and the bottom of the stairs. If you do not have them, ask someone to add them for you. What else can I do to help prevent falls? Wear shoes that: Do not have high heels. Have rubber bottoms. Are comfortable and fit you well. Are closed at the toe. Do not wear sandals. If you use a stepladder: Make sure that it is fully opened. Do not climb a closed stepladder. Make sure that both sides of the stepladder are locked into place. Ask someone to hold it for you, if possible. Clearly mark and make sure that you can see: Any grab bars or handrails. First and last steps. Where the edge of each step is. Use tools that help you move around (mobility aids) if they are needed. These include: Canes. Walkers. Scooters. Crutches. Turn on the lights when you go into a dark area. Replace any light bulbs as soon as they burn out. Set up your furniture so you have a clear path. Avoid moving your furniture around. If any of your floors are uneven, fix them. If there are any pets around you, be aware of where they are. Review your medicines with your doctor. Some medicines can make you feel dizzy. This can increase your chance of falling. Ask your doctor what other things that you can do to help prevent falls. This information is not intended to replace  advice given to you by your  health care provider. Make sure you discuss any questions you have with your health care provider. Document Released: 02/04/2009 Document Revised: 09/16/2015 Document Reviewed: 05/15/2014 Elsevier Interactive Patient Education  2017 [employer].

## 2022-06-19 NOTE — Addendum Note (Signed)
Addended by: Willette Brace on: 06/19/2022 02:00 PM   Modules accepted: Orders

## 2022-06-21 DIAGNOSIS — M25552 Pain in left hip: Secondary | ICD-10-CM | POA: Diagnosis not present

## 2022-06-27 ENCOUNTER — Telehealth: Payer: Self-pay | Admitting: Pharmacist

## 2022-06-27 NOTE — Progress Notes (Signed)
Care Management & Coordination Services Pharmacy Team  Reason for Encounter: General adherence update   Contacted patient for general health update and medication adherence call.  Spoke with patient on 06/28/2022    What concerns do you have about your medications? Patient states she switched to Cymbalta from Prozac. However, she wants to double check that she should still continue Sertraline 100 mg daily. -Patient states she hasn't noticed an improvement with her depression since switching to Cymbalta.  The patient denies side effects with their medications.   How often do you forget or accidentally miss a dose? Rarely  Do you use a pillbox? Yes  Are you having any problems getting your medications from your pharmacy? No  Has the cost of your medications been a concern? No If yes, what medication and is patient assistance available or has it been applied for?  Since last visit with PharmD, the following interventions have been made.  Patient switched to Cymbalta from Prozac.  The patient has not had an ED visit since last contact.   The patient denies problems with their health.   Patient denies concerns or questions for Erskine Emery, PharmD at this time other than what is mentioned above.  Counseled patient on: Access to carecoordination team for any cost, medication or pharmacy concerns.     Chart review: Recent office visits:  06/09/2022 [facility] (PCP) Ardith Dark, MD; Her B12 level is low. Recommend starting 1000 mcg once daily.   05/22/2022 [facility] (PCP) Ardith Dark, MD; We will switch to Cymbalta. Hopefully this will help some with her chronic pain issues as well. We discussed potential side effects of medication. She will follow-up me in a couple weeks via MyChart  Recent consult visits:  None since last PharmD visit  Hospital visits:  None in previous 6 months  Medications: Outpatient Encounter Medications as of 06/27/2022  Medication Sig   albuterol (PROVENTIL)  (2.5 MG/3ML) 0.083% nebulizer solution Take 3 mLs (2.5 mg total) by nebulization every 6 (six) hours as needed for wheezing or shortness of breath.   atorvastatin (LIPITOR) 40 MG tablet Take 1 tablet by mouth once daily   azelastine (ASTELIN) 0.1 % nasal spray Place 2 sprays into both nostrils 2 (two) times daily.   carvedilol (COREG) 12.5 MG tablet TAKE 1 & 1/2 (ONE & ONE-HALF) TABLETS BY MOUTH TWICE DAILY   clopidogrel (PLAVIX) 75 MG tablet Take 1 tablet by mouth once daily   diazepam (VALIUM) 5 MG tablet TAKE 1 TABLET BY MOUTH EVERY 12 HOURS AS NEEDED FOR ANXIETY   DULoxetine (CYMBALTA) 60 MG capsule Take 1 capsule (60 mg total) by mouth daily.   ezetimibe (ZETIA) 10 MG tablet Take 1 tablet by mouth once daily   furosemide (LASIX) 40 MG tablet TAKE 1 TABLET BY MOUTH ONCE DAILY . APPOINTMENT REQUIRED FOR FUTURE REFILLS   levocetirizine (XYZAL) 5 MG tablet Take 5 mg by mouth every evening.   loperamide (IMODIUM A-D) 2 MG tablet Take 4 mg by mouth as needed.   nitroGLYCERIN (NITROSTAT) 0.4 MG SL tablet PLACE 1 TABLET UNDER THE TONGUE EVERY 5 MINUTES AS NEEDED FOR CHEST PAIN   pantoprazole (PROTONIX) 40 MG tablet Take 1 tablet by mouth once daily   sacubitril-valsartan (ENTRESTO) 24-26 MG Take 1 tablet by mouth 2 (two) times daily.   sertraline (ZOLOFT) 100 MG tablet Take 1 tablet by mouth once daily   No facility-administered encounter medications on file as of 06/27/2022.   BP Readings from Last 3  Encounters:  06/19/22 104/62  06/09/22 125/76  05/22/22 (!) 144/74    Pulse Readings from Last 3 Encounters:  06/19/22 74  06/09/22 (!) 56  05/22/22 (!) 51    Lab Results  Component Value Date/Time   HGBA1C 6.3 06/09/2022 09:28 AM   HGBA1C 5.5 01/12/2021 02:19 AM   Lab Results  Component Value Date   CREATININE 0.90 06/09/2022   BUN 30 (H) 06/09/2022   GFR 66.81 06/09/2022   GFRNONAA >60 01/10/2021   GFRAA 65 05/07/2020   NA 139 06/09/2022   K 4.2 06/09/2022   CALCIUM 9.3 06/09/2022    CO2 30 06/09/2022    Future Appointments  Date Time Provider Department Center  09/14/2022  7:10 AM [facility] DEVICE REMOTES [facility]  11/07/2022  3:00 PM Willa Frater L, RPH [facility] None  12/14/2022  7:10 AM [facility] DEVICE REMOTES [facility]  03/15/2023  7:10 AM [facility] DEVICE REMOTES [facility]  06/14/2023  7:10 AM [facility] DEVICE REMOTES [facility]  07/09/2023  7:45 AM [facility] HEALTH CO[facility]  09/13/2023  7:10 AM [facility] DEVICE REMOTES [facility]  12/13/2023  7:10 AM [facility] DEVICE REMOTES [facility]   Care Gaps: Annual wellness visit in last year? Yes  Star Rating Drugs:  Atorvastatin 40 mg last filled 06/07/2022 90 DS   April D Calhoun, [facility] Clinical Pharmacist Assistant [phone]

## 2022-06-28 ENCOUNTER — Other Ambulatory Visit: Payer: Self-pay | Admitting: Cardiology

## 2022-06-28 ENCOUNTER — Other Ambulatory Visit: Payer: Self-pay | Admitting: Family Medicine

## 2022-07-10 NOTE — Progress Notes (Signed)
Remote ICD transmission.   

## 2022-07-16 ENCOUNTER — Other Ambulatory Visit: Payer: Self-pay | Admitting: Family Medicine

## 2022-07-19 ENCOUNTER — Other Ambulatory Visit: Payer: Self-pay | Admitting: Family Medicine

## 2022-07-19 ENCOUNTER — Other Ambulatory Visit: Payer: Self-pay | Admitting: Cardiology
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
## 2022-08-02 ENCOUNTER — Encounter: Payer: Self-pay | Admitting: Nurse Practitioner

## 2022-08-02 ENCOUNTER — Ambulatory Visit: Payer: Medicare Other | Attending: Nurse Practitioner | Admitting: Nurse Practitioner

## 2022-08-02 VITALS — BP 94/60 | HR 52 | Ht 61.0 in | Wt 137.6 lb

## 2022-08-02 DIAGNOSIS — I639 Cerebral infarction, unspecified: Secondary | ICD-10-CM

## 2022-08-02 DIAGNOSIS — I5022 Chronic systolic (congestive) heart failure: Secondary | ICD-10-CM

## 2022-08-02 DIAGNOSIS — I10 Essential (primary) hypertension: Secondary | ICD-10-CM | POA: Diagnosis not present

## 2022-08-02 DIAGNOSIS — E7800 Pure hypercholesterolemia, unspecified: Secondary | ICD-10-CM | POA: Diagnosis not present

## 2022-08-02 MED ORDER — CARVEDILOL 6.25 MG PO TABS: 6.2500 mg | ORAL_TABLET | Freq: Two times a day (BID) | ORAL | 0 refills | Status: DC

## 2022-08-02 MED ORDER — FUROSEMIDE 20 MG PO TABS: 20.0000 mg | ORAL_TABLET | Freq: Every day | ORAL | 0 refills | Status: DC

## 2022-08-02 NOTE — Patient Instructions (Signed)
Medication Instructions:  DECREASE Lasix to 20mg  Take 1 tablet daily DECREASE Coreg to 6.25mg  Take 1 tablet twice a day  *If you need a refill on your cardiac medications before your next appointment, please call your pharmacy*   Lab Work: None ordered   Testing/Procedures: None ordered   Follow-Up: At [facility], you and your health needs are our priority.  As part of our continuing mission to provide you with exceptional heart care, we have created designated Provider Care Teams.  These Care Teams include your primary Cardiologist (physician) and Advanced Practice Providers (APPs -  Physician Assistants and Nurse Practitioners) who all work together to provide you with the care you need, when you need it.  We recommend signing up for the patient portal called "MyChart".  Sign up information is provided on this After Visit Summary.  MyChart is used to connect with patients for Virtual Visits (Telemedicine).  Patients are able to view lab/test results, encounter notes, upcoming appointments, etc.  Non-urgent messages can be sent to your provider as well.   To learn more about what you can do with MyChart, go to [email or web address].    Your next appointment:   3 month(s)  Provider:   Robin Searing, NP       Other Instructions
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
## 2022-08-04 ENCOUNTER — Ambulatory Visit: Payer: Medicare Other | Attending: Physician Assistant | Admitting: Physician Assistant

## 2022-08-04 ENCOUNTER — Encounter: Payer: Self-pay | Admitting: Physician Assistant

## 2022-08-04 VITALS — BP 146/80 | HR 74 | Ht 62.0 in | Wt 136.0 lb

## 2022-08-04 DIAGNOSIS — Z95810 Presence of automatic (implantable) cardiac defibrillator: Secondary | ICD-10-CM

## 2022-08-04 DIAGNOSIS — I428 Other cardiomyopathies: Secondary | ICD-10-CM

## 2022-08-04 DIAGNOSIS — I471 Supraventricular tachycardia, unspecified: Secondary | ICD-10-CM

## 2022-08-04 LAB — CUP PACEART INCLINIC DEVICE CHECK
Date Time Interrogation Session: 20240412185744
HighPow Impedance: 67 Ohm
Implantable Lead Connection Status: 753985
Implantable Lead Connection Status: 753985
Implantable Lead Connection Status: 753985
Implantable Lead Implant Date: 20190718
Implantable Lead Implant Date: 20190718
Implantable Lead Implant Date: 20190718
Implantable Lead Location: 753858
Implantable Lead Location: 753859
Implantable Lead Location: 753860
Implantable Lead Model: 292
Implantable Lead Model: 4671
Implantable Lead Model: 7740
Implantable Lead Serial Number: 447121
Implantable Lead Serial Number: 694576
Implantable Lead Serial Number: 815914
Implantable Pulse Generator Implant Date: 20190718
Lead Channel Impedance Value: 429 Ohm
Lead Channel Impedance Value: 438 Ohm
Lead Channel Impedance Value: 515 Ohm
Lead Channel Pacing Threshold Amplitude: 0.9 V
Lead Channel Pacing Threshold Amplitude: 1 V
Lead Channel Pacing Threshold Amplitude: 1.7 V
Lead Channel Pacing Threshold Pulse Width: 0.4 ms
Lead Channel Pacing Threshold Pulse Width: 0.4 ms
Lead Channel Pacing Threshold Pulse Width: 0.4 ms
Lead Channel Sensing Intrinsic Amplitude: 14.2 mV
Lead Channel Sensing Intrinsic Amplitude: 18.9 mV
Lead Channel Sensing Intrinsic Amplitude: 6.4 mV
Lead Channel Setting Pacing Amplitude: 2 V
Lead Channel Setting Pacing Amplitude: 2.4 V
Lead Channel Setting Pacing Amplitude: 2.6 V
Lead Channel Setting Pacing Pulse Width: 0.4 ms
Lead Channel Setting Pacing Pulse Width: 0.4 ms
Lead Channel Setting Sensing Sensitivity: 0.5 mV
Lead Channel Setting Sensing Sensitivity: 1 mV
Pulse Gen Serial Number: 215046
Zone Setting Status: 755011

## 2022-08-04 NOTE — Patient Instructions (Signed)
Medication Instructions:   Your physician recommends that you continue on your current medications as directed. Please refer to the Current Medication list given to you today.  *If you need a refill on your cardiac medications before your next appointment, please call your pharmacy*   Lab Work:  NONE ORDERED  TODAY    If you have labs (blood work) drawn today and your tests are completely normal, you will receive your results only by: MyChart Message (if you have MyChart) OR A paper copy in the mail If you have any lab test that is abnormal or we need to change your treatment, we will call you to review the results.   Testing/Procedures: NONE ORDERED  TODAY      Follow-Up: At [facility], you and your health needs are our priority.  As part of our continuing mission to provide you with exceptional heart care, we have created designated Provider Care Teams.  These Care Teams include your primary Cardiologist (physician) and Advanced Practice Providers (APPs -  Physician Assistants and Nurse Practitioners) who all work together to provide you with the care you need, when you need it.  We recommend signing up for the patient portal called "MyChart".  Sign up information is provided on this After Visit Summary.  MyChart is used to connect with patients for Virtual Visits (Telemedicine).  Patients are able to view lab/test results, encounter notes, upcoming appointments, etc.  Non-urgent messages can be sent to your provider as well.   To learn more about what you can do with MyChart, go to [email or web address].    Your next appointment:   1 year(s)  Provider:   Gregg Taylor, MD    Other Instructions  

## 2022-08-20 ENCOUNTER — Other Ambulatory Visit: Payer: Self-pay | Admitting: Family Medicine

## 2022-08-31 ENCOUNTER — Other Ambulatory Visit: Payer: Self-pay | Admitting: Cardiology

## 2022-09-11 ENCOUNTER — Other Ambulatory Visit: Payer: Self-pay | Admitting: Family Medicine

## 2022-09-11 ENCOUNTER — Other Ambulatory Visit: Payer: Self-pay | Admitting: Cardiology

## 2022-09-13 ENCOUNTER — Other Ambulatory Visit: Payer: Self-pay | Admitting: Family Medicine

## 2022-09-14 ENCOUNTER — Ambulatory Visit (INDEPENDENT_AMBULATORY_CARE_PROVIDER_SITE_OTHER): Payer: Medicare Other

## 2022-09-14 DIAGNOSIS — I5022 Chronic systolic (congestive) heart failure: Secondary | ICD-10-CM

## 2022-09-14 DIAGNOSIS — I428 Other cardiomyopathies: Secondary | ICD-10-CM

## 2022-09-19 LAB — CUP PACEART REMOTE DEVICE CHECK
Battery Remaining Longevity: 90 mo
Battery Remaining Percentage: 100 %
Brady Statistic RA Percent Paced: 1 %
Brady Statistic RV Percent Paced: 0 %
Date Time Interrogation Session: 20240523044000
HighPow Impedance: 67 Ohm
Implantable Lead Connection Status: 753985
Implantable Lead Connection Status: 753985
Implantable Lead Connection Status: 753985
Implantable Lead Implant Date: 20190718
Implantable Lead Implant Date: 20190718
Implantable Lead Implant Date: 20190718
Implantable Lead Location: 753858
Implantable Lead Location: 753859
Implantable Lead Location: 753860
Implantable Lead Model: 292
Implantable Lead Model: 4671
Implantable Lead Model: 7740
Implantable Lead Serial Number: 447121
Implantable Lead Serial Number: 694576
Implantable Lead Serial Number: 815914
Implantable Pulse Generator Implant Date: 20190718
Lead Channel Impedance Value: 428 Ohm
Lead Channel Impedance Value: 438 Ohm
Lead Channel Impedance Value: 526 Ohm
Lead Channel Setting Pacing Amplitude: 2 V
Lead Channel Setting Pacing Amplitude: 2.4 V
Lead Channel Setting Pacing Amplitude: 2.6 V
Lead Channel Setting Pacing Pulse Width: 0.4 ms
Lead Channel Setting Pacing Pulse Width: 0.4 ms
Lead Channel Setting Sensing Sensitivity: 0.5 mV
Lead Channel Setting Sensing Sensitivity: 1 mV
Pulse Gen Serial Number: 215046
Zone Setting Status: 755011

## 2022-09-21 ENCOUNTER — Other Ambulatory Visit: Payer: Self-pay | Admitting: Family Medicine

## 2022-10-02 ENCOUNTER — Other Ambulatory Visit: Payer: Self-pay | Admitting: Family Medicine

## 2022-10-04 DIAGNOSIS — F3342 Major depressive disorder, recurrent, in full remission: Secondary | ICD-10-CM | POA: Diagnosis not present

## 2022-10-04 DIAGNOSIS — M1990 Unspecified osteoarthritis, unspecified site: Secondary | ICD-10-CM | POA: Diagnosis not present

## 2022-10-04 LAB — HM DEXA SCAN

## 2022-10-04 NOTE — Progress Notes (Signed)
Remote ICD transmission.   

## 2022-10-09 ENCOUNTER — Ambulatory Visit (INDEPENDENT_AMBULATORY_CARE_PROVIDER_SITE_OTHER): Payer: Medicare Other | Admitting: Family Medicine

## 2022-10-09 VITALS — BP 135/83 | HR 76 | Temp 98.0°F | Ht 62.0 in | Wt 134.6 lb

## 2022-10-09 DIAGNOSIS — M79641 Pain in right hand: Secondary | ICD-10-CM

## 2022-10-09 DIAGNOSIS — R3911 Hesitancy of micturition: Secondary | ICD-10-CM | POA: Diagnosis not present

## 2022-10-09 DIAGNOSIS — M19041 Primary osteoarthritis, right hand: Secondary | ICD-10-CM

## 2022-10-09 DIAGNOSIS — I10 Essential (primary) hypertension: Secondary | ICD-10-CM | POA: Diagnosis not present

## 2022-10-09 MED ORDER — NITROFURANTOIN MONOHYD MACRO 100 MG PO CAPS
100.0000 mg | ORAL_CAPSULE | Freq: Two times a day (BID) | ORAL | 0 refills | Status: DC
Start: 2022-10-09 — End: 2022-11-01

## 2022-10-09 MED ORDER — PREDNISONE 50 MG PO TABS: ORAL_TABLET | ORAL | 0 refills | Status: DC

## 2022-10-09 NOTE — Assessment & Plan Note (Signed)
Blood pressure at goal on Entresto 24-26 twice daily, and Coreg 6.25 mg twice daily.

## 2022-10-09 NOTE — Addendum Note (Signed)
Addended by: Dyann Kief on: 10/09/2022 08:07 AM   Modules accepted: Orders

## 2022-10-09 NOTE — Patient Instructions (Signed)
It was very nice to see you today!  I think you probably have carpal tunnel syndrome.  Please start the prednisone.  Work on exercises.  Let me know if not improving in 1 to 2 weeks.  Start the [employer].  We will check urine sample to confirm UTI and rule out other possible causes.  Return if symptoms worsen or fail to improve.   Take care, Dr [name]  PLEASE NOTE:  If you had any lab tests, please let us know if you have not heard back within a few days. You may see your results on mychart before we have a chance to review them but we will give you a call once they are reviewed by [place].   If we ordered any referrals today, please let us know if you have not heard from their office within the next week.   If you had any urgent prescriptions sent in today, please check with the pharmacy within an hour of our visit to make sure the prescription was transmitted appropriately.   Please try these tips to maintain a healthy lifestyle:  Eat at least 3 REAL meals and 1-2 snacks per day.  Aim for no more than 5 hours between eating.  If you eat breakfast, please do so within one hour of getting up.   Each meal should contain half fruits/vegetables, one quarter protein, and one quarter carbs (no bigger than a computer mouse)  Cut down on sweet beverages. This includes juice, soda, and sweet tea.   Drink at least 1 glass of water with each meal and aim for at least 8 glasses per day  Exercise at least 150 minutes every week.

## 2022-10-09 NOTE — Assessment & Plan Note (Signed)
Could be contributing to above hand pain.  Should have improvement with prednisone as above.  Need to avoid extended courses of NSAIDs at this point.  If this continues to be an issue will need to be referred to orthopedics or sports medicine.

## 2022-10-09 NOTE — Progress Notes (Signed)
   Anita Diaz is a 67 y.o. female who presents today for an office visit.  Assessment/Plan:  New/Acute Problems: Right Hand Pain She does have some underlying osteoarthritis however current symptoms are more consistent with carpal tunnel syndrome.  She does have a positive Phalen test on exam.  Will need to avoid NSAIDs due to cardiac history.  Will start short prednisone burst.  Discussed home exercises and handout was given.  She will let us know if not improving and we can refer to sports medicine orthopedics at that time  Urinary hesitancy History consistent with UTI.  Will empirically start Macrobid 100 mg twice daily while we await culture results.  No red flags or signs of systemic illness.  We discussed reasons to return to care and seek urgent care.  Chronic Problems Addressed Today: Hypertension Blood pressure at goal on Entresto 24-26 twice daily, and Coreg 6.25 mg twice daily.  Osteoarthritis, hand Could be contributing to above hand pain.  Should have improvement with prednisone as above.  Need to avoid extended courses of NSAIDs at this point.  If this continues to be an issue will need to be referred to orthopedics or sports medicine.     Subjective:  HPI:  See Assessment / plan for status of chronic conditions.  Her primary concern today is right hand pain.  Started about a month ago.  Happens at night while sleeping.  Says feels like she has a sensation that her fingers are enlarged.  Occasionally gets numbness and tingling.  Sometimes happens during the day as well.  No specific treatments tried.  Occasionally gets similar symptoms on left side as well.  She also is concerned about UTI.  This started a few weeks ago.  Has a lot of urinary hesitancy.  Some dysuria.  No fevers or chills.  Some abdominal pain.       Objective:  Physical Exam: BP 135/83   Pulse 76   Temp 98 F (36.7 C) (Temporal)   Ht 5\' 2"  (1.575 m)   Wt 134 lb 9.6 oz (61.1 kg)   SpO2 99%    BMI 24.62 kg/m   Gen: No acute distress, resting comfortably CV: Regular rate and rhythm with no murmurs appreciated Pulm: Normal work of breathing, clear to auscultation bilaterally with no crackles, wheezes, or rhonchi MUSCULOSKELETAL: - Arms: Bony hypertrophy noted on bilateral hands.  Tinel sign negative at medial epicondyles and wrist.  Neurovascular intact distally.  They will test positive - Neck: No deformities.  Limited range of motion.  Spurling negative bilaterally. Neuro: Grossly normal, moves all extremities Psych: Normal affect and thought content      Shah Insley M. [name], MD 10/09/2022 7:43 AM

## 2022-10-11 LAB — URINE CULTURE
MICRO NUMBER:: 15090852
SPECIMEN QUALITY:: ADEQUATE

## 2022-10-12 NOTE — Progress Notes (Signed)
Her urine culture confirms urinary tract infection.  The antibiotic we sent in for her should clear this up.  She should let us know if symptoms are not improving.

## 2022-10-18 ENCOUNTER — Other Ambulatory Visit: Payer: Self-pay | Admitting: Family Medicine

## 2022-10-23 ENCOUNTER — Encounter: Payer: Self-pay | Admitting: Family Medicine

## 2022-10-24 ENCOUNTER — Encounter: Payer: Self-pay | Admitting: Pharmacist

## 2022-10-24 ENCOUNTER — Other Ambulatory Visit: Payer: Self-pay | Admitting: Family Medicine

## 2022-10-24 NOTE — Progress Notes (Signed)
Patient previously followed by [facility] pharmacist. Per clinical review, no pharmacist appointment needed at this time. Will notify pharmacy patient advocate team of patient assistance enrollment for Entresto for re-enrollment for 2025. Care guide directed to contact patient and cancel appointment and notify pharmacy team of any patient concerns.
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
## 2022-11-01 ENCOUNTER — Encounter: Payer: Self-pay | Admitting: Nurse Practitioner

## 2022-11-01 ENCOUNTER — Ambulatory Visit: Payer: Medicare Other | Attending: Nurse Practitioner | Admitting: Nurse Practitioner

## 2022-11-01 VITALS — BP 138/70 | HR 65 | Ht 62.0 in | Wt 134.2 lb

## 2022-11-01 DIAGNOSIS — I639 Cerebral infarction, unspecified: Secondary | ICD-10-CM

## 2022-11-01 DIAGNOSIS — I10 Essential (primary) hypertension: Secondary | ICD-10-CM

## 2022-11-01 DIAGNOSIS — I5022 Chronic systolic (congestive) heart failure: Secondary | ICD-10-CM

## 2022-11-01 DIAGNOSIS — E7800 Pure hypercholesterolemia, unspecified: Secondary | ICD-10-CM

## 2022-11-01 DIAGNOSIS — I447 Left bundle-branch block, unspecified: Secondary | ICD-10-CM | POA: Diagnosis not present

## 2022-11-01 MED ORDER — EMPAGLIFLOZIN 10 MG PO TABS: 10.0000 mg | ORAL_TABLET | Freq: Every day | ORAL | 0 refills | Status: DC

## 2022-11-01 MED ORDER — EMPAGLIFLOZIN 10 MG PO TABS: 10.0000 mg | ORAL_TABLET | Freq: Every day | ORAL | 5 refills | Status: DC

## 2022-11-01 NOTE — Patient Instructions (Addendum)
Medication Instructions:  START [name] 10mg  Take 1 tablet once a day  Samples of [name] given and patient assistance forms have been provided *If you need a refill on your cardiac medications before your next appointment, please call your pharmacy*   Lab Work: 2 WEEKS BMET  If you have labs (blood work) drawn today and your tests are completely normal, you will receive your results only by: MyChart Message (if you have MyChart) OR A paper copy in the mail If you have any lab test that is abnormal or we need to change your treatment, we will call you to review the results.   Testing/Procedures: NONE ORDERED   Follow-Up: At [facility], you and your health needs are our priority.  As part of our continuing mission to provide you with exceptional heart care, we have created designated Provider Care Teams.  These Care Teams include your primary Cardiologist (physician) and Advanced Practice Providers (APPs -  Physician Assistants and Nurse Practitioners) who all work together to provide you with the care you need, when you need it.  We recommend signing up for the patient portal called "MyChart".  Sign up information is provided on this After Visit Summary.  MyChart is used to connect with patients for Virtual Visits (Telemedicine).  Patients are able to view lab/test results, encounter notes, upcoming appointments, etc.  Non-urgent messages can be sent to your provider as well.   To learn more about what you can do with MyChart, go to [email or web address].    Your next appointment:   12 month(s)  Provider:   Donato Schultz, MD     Other Instructions HANDICAP PLACARD GIVEN

## 2022-11-03 ENCOUNTER — Other Ambulatory Visit: Payer: Self-pay | Admitting: Family Medicine

## 2022-11-07 ENCOUNTER — Encounter: Payer: Medicare Other | Admitting: Pharmacist

## 2022-11-15 ENCOUNTER — Other Ambulatory Visit: Payer: Self-pay | Admitting: Family Medicine

## 2022-11-15 ENCOUNTER — Ambulatory Visit: Payer: Medicare Other | Attending: Nurse Practitioner

## 2022-11-15 DIAGNOSIS — I5022 Chronic systolic (congestive) heart failure: Secondary | ICD-10-CM

## 2022-11-15 DIAGNOSIS — I639 Cerebral infarction, unspecified: Secondary | ICD-10-CM | POA: Diagnosis not present

## 2022-11-15 DIAGNOSIS — I447 Left bundle-branch block, unspecified: Secondary | ICD-10-CM | POA: Diagnosis not present

## 2022-11-15 DIAGNOSIS — M7582 Other shoulder lesions, left shoulder: Secondary | ICD-10-CM | POA: Diagnosis not present

## 2022-11-15 DIAGNOSIS — I10 Essential (primary) hypertension: Secondary | ICD-10-CM | POA: Diagnosis not present

## 2022-11-15 DIAGNOSIS — E7800 Pure hypercholesterolemia, unspecified: Secondary | ICD-10-CM

## 2022-11-16 LAB — BASIC METABOLIC PANEL
BUN/Creatinine Ratio: 19 (ref 12–28)
BUN: 22 mg/dL (ref 8–27)
CO2: 25 mmol/L (ref 20–29)
Chloride: 102 mmol/L (ref 96–106)
Creatinine, Ser: 1.14 mg/dL — ABNORMAL HIGH (ref 0.57–1.00)
Glucose: 106 mg/dL — ABNORMAL HIGH (ref 70–99)
Potassium: 4.5 mmol/L (ref 3.5–5.2)
Sodium: 139 mmol/L (ref 134–144)

## 2022-11-20 ENCOUNTER — Other Ambulatory Visit: Payer: Self-pay

## 2022-11-20 DIAGNOSIS — E7800 Pure hypercholesterolemia, unspecified: Secondary | ICD-10-CM

## 2022-11-20 DIAGNOSIS — I10 Essential (primary) hypertension: Secondary | ICD-10-CM

## 2022-11-20 DIAGNOSIS — I5022 Chronic systolic (congestive) heart failure: Secondary | ICD-10-CM

## 2022-11-20 DIAGNOSIS — I447 Left bundle-branch block, unspecified: Secondary | ICD-10-CM

## 2022-11-25 ENCOUNTER — Other Ambulatory Visit: Payer: Self-pay | Admitting: Nurse Practitioner

## 2022-11-25 ENCOUNTER — Other Ambulatory Visit: Payer: Self-pay | Admitting: Family Medicine

## 2022-12-04 ENCOUNTER — Other Ambulatory Visit (HOSPITAL_BASED_OUTPATIENT_CLINIC_OR_DEPARTMENT_OTHER): Payer: Self-pay | Admitting: Orthopedic Surgery

## 2022-12-04 DIAGNOSIS — M75100 Unspecified rotator cuff tear or rupture of unspecified shoulder, not specified as traumatic: Secondary | ICD-10-CM

## 2022-12-04 DIAGNOSIS — M7582 Other shoulder lesions, left shoulder: Secondary | ICD-10-CM | POA: Diagnosis not present

## 2022-12-05 ENCOUNTER — Other Ambulatory Visit (HOSPITAL_COMMUNITY): Payer: Self-pay | Admitting: Orthopedic Surgery

## 2022-12-05 DIAGNOSIS — M75100 Unspecified rotator cuff tear or rupture of unspecified shoulder, not specified as traumatic: Secondary | ICD-10-CM

## 2022-12-11 DIAGNOSIS — J209 Acute bronchitis, unspecified: Secondary | ICD-10-CM | POA: Diagnosis not present

## 2022-12-12 ENCOUNTER — Other Ambulatory Visit: Payer: Self-pay | Admitting: Family Medicine

## 2022-12-14 ENCOUNTER — Ambulatory Visit (INDEPENDENT_AMBULATORY_CARE_PROVIDER_SITE_OTHER): Payer: Medicare Other

## 2022-12-14 DIAGNOSIS — I428 Other cardiomyopathies: Secondary | ICD-10-CM | POA: Diagnosis not present

## 2022-12-17 ENCOUNTER — Other Ambulatory Visit: Payer: Self-pay | Admitting: Family Medicine

## 2022-12-18 LAB — CUP PACEART REMOTE DEVICE CHECK
Battery Remaining Longevity: 90 mo
Battery Remaining Percentage: 98 %
Brady Statistic RA Percent Paced: 2 %
Brady Statistic RV Percent Paced: 0 %
Date Time Interrogation Session: 20240824104200
HighPow Impedance: 74 Ohm
Implantable Lead Connection Status: 753985
Implantable Lead Connection Status: 753985
Implantable Lead Connection Status: 753985
Implantable Lead Implant Date: 20190718
Implantable Lead Implant Date: 20190718
Implantable Lead Implant Date: 20190718
Implantable Lead Location: 753858
Implantable Lead Location: 753859
Implantable Lead Location: 753860
Implantable Lead Model: 292
Implantable Lead Model: 4671
Implantable Lead Model: 7740
Implantable Lead Serial Number: 447121
Implantable Lead Serial Number: 694576
Implantable Lead Serial Number: 815914
Implantable Pulse Generator Implant Date: 20190718
Lead Channel Impedance Value: 403 Ohm
Lead Channel Impedance Value: 409 Ohm
Lead Channel Impedance Value: 512 Ohm
Lead Channel Setting Pacing Amplitude: 2 V
Lead Channel Setting Pacing Amplitude: 2.4 V
Lead Channel Setting Pacing Amplitude: 2.6 V
Lead Channel Setting Pacing Pulse Width: 0.4 ms
Lead Channel Setting Pacing Pulse Width: 0.4 ms
Lead Channel Setting Sensing Sensitivity: 0.5 mV
Lead Channel Setting Sensing Sensitivity: 1 mV
Pulse Gen Serial Number: 215046
Zone Setting Status: 755011

## 2022-12-20 ENCOUNTER — Ambulatory Visit: Payer: Medicare Other | Attending: Nurse Practitioner

## 2022-12-20 ENCOUNTER — Telehealth: Payer: Self-pay | Admitting: Nurse Practitioner

## 2022-12-20 DIAGNOSIS — I447 Left bundle-branch block, unspecified: Secondary | ICD-10-CM

## 2022-12-20 DIAGNOSIS — I10 Essential (primary) hypertension: Secondary | ICD-10-CM

## 2022-12-20 DIAGNOSIS — I5022 Chronic systolic (congestive) heart failure: Secondary | ICD-10-CM

## 2022-12-20 DIAGNOSIS — E7800 Pure hypercholesterolemia, unspecified: Secondary | ICD-10-CM | POA: Diagnosis not present

## 2022-12-20 LAB — BASIC METABOLIC PANEL
BUN/Creatinine Ratio: 19 (ref 12–28)
BUN: 17 mg/dL (ref 8–27)
CO2: 25 mmol/L (ref 20–29)
Calcium: 9.6 mg/dL (ref 8.7–10.3)
Chloride: 102 mmol/L (ref 96–106)
Creatinine, Ser: 0.89 mg/dL (ref 0.57–1.00)
Glucose: 103 mg/dL — ABNORMAL HIGH (ref 70–99)
Potassium: 4.1 mmol/L (ref 3.5–5.2)
Sodium: 142 mmol/L (ref 134–144)
eGFR: 71 mL/min/{1.73_m2} (ref >59)

## 2022-12-20 NOTE — Telephone Encounter (Signed)
Pt's patient assistance paperwork was scanned to [facility], [employer]. FYI

## 2022-12-20 NOTE — Telephone Encounter (Signed)
Application received, printed and given to Dr Anne Fu to to signed.

## 2022-12-20 NOTE — Telephone Encounter (Signed)
Paper Work Dropped Off: APPLICATION FOR MEDS   Date: 12/20/2022  Location of paper: IN PINK FOLDER

## 2022-12-20 NOTE — Telephone Encounter (Signed)
**Note De-Identified Anita Diaz Obfuscation** I have completed the providers page of the pts [employer] application for [name] assistance and have faxed her entire application with documents to Dr Anne Fu nurse so she can obtain his signature, date it, and to fax all to [facility] Cares at the fax number written on the cover letter included.

## 2022-12-20 NOTE — Telephone Encounter (Signed)
Application has been signed and faxed with confirmation.

## 2022-12-21 NOTE — Telephone Encounter (Signed)
**Note De-Identified Anita Diaz Obfuscation** Letter received Breiona Couvillon fax from [facility] stating that they have approved the pt for [name] assistance until 04/24/2023. The letter states that they have notified the pt of this approval as well.

## 2022-12-23 ENCOUNTER — Other Ambulatory Visit: Payer: Self-pay | Admitting: Family Medicine

## 2022-12-26 ENCOUNTER — Other Ambulatory Visit: Payer: Self-pay | Admitting: Family Medicine

## 2022-12-26 NOTE — Progress Notes (Signed)
Remote ICD transmission.   

## 2022-12-29 ENCOUNTER — Other Ambulatory Visit: Payer: Self-pay | Admitting: Nurse Practitioner

## 2023-01-16 ENCOUNTER — Other Ambulatory Visit: Payer: Self-pay

## 2023-01-16 MED ORDER — EMPAGLIFLOZIN 10 MG PO TABS: 10.0000 mg | ORAL_TABLET | Freq: Every day | ORAL | 3 refills | Status: DC

## 2023-01-23 ENCOUNTER — Other Ambulatory Visit: Payer: Self-pay | Admitting: Family Medicine

## 2023-01-24 ENCOUNTER — Telehealth: Payer: Self-pay | Admitting: Family Medicine

## 2023-01-24 ENCOUNTER — Other Ambulatory Visit: Payer: Self-pay | Admitting: Family Medicine
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
## 2023-01-24 NOTE — Telephone Encounter (Signed)
Final outcome: Go to ED Now.    Patient Name First: Anita Last: Diaz Gender: Female DOB: 1956-01-03 Age: 67 Y 8 M 21 D Return Phone Number: [phone] (Primary) Address: City/ State/ Zip: [place]  [address] Client [facility] - Administrator, [facility] Provider Jacquiline Doe- MD Contact Type Call Who Is Calling Patient / Member / Family / Caregiver Call Type Triage / Clinical Relationship To Patient Self Return Phone Number [phone] (Primary) Chief Complaint CONFUSION - new onset Reason for Call Symptomatic / Request for Health Information Initial Comment Caller states she has a patient who has been having a lot of episodes of hitting her head to the point now that her daughter says she has a lot of confusion. She seems cognizant. She was transferred over for triage. She is also having headaches and blackouts at least once a week. 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 Her daughter wants her to get her head scanned. Translation No Nurse Assessment Nurse: Daphine Deutscher, RN, Melanie Date/Time (Eastern Time): 01/24/2023 2:14:32 PM Confirm and document reason for call. If symptomatic, describe symptoms. ---Caller states she is very confused and has hit her head several times over the last weeks off and on. states she is always bumping her head. Her boss and daughter are worried. Does the patient have any new or worsening symptoms? ---Yes Will a triage be completed? ---Yes Related visit to physician within the last 2 weeks? ---No Does the PT have any chronic conditions? (i.e. diabetes, asthma, this includes High risk factors for pregnancy, etc.) ---Yes List chronic conditions. ---pacemaker Is this a behavioral health or substance abuse call? ---No Guidelines Guideline Title Affirmed Question Affirmed Notes Nurse Date/Time Lamount Cohen Time) Head Injury Can't  remember what happened (amnesia) Daphine Deutscher, RN, Shawna Orleans 01/24/2023 2:16:42 PM Disp. Time Lamount Cohen Time) Disposition Final User 01/24/2023 2:12:08 PM Send to Urgent Geanie Logan 01/24/2023 2:19:55 PM Go to ED Now Yes Daphine Deutscher, RN, Melanie Final Disposition 01/24/2023 2:19:55 PM Go to ED Now Yes Daphine Deutscher, RN, Mittie Bodo Disagree/Comply Disagree Caller Understands Yes PreDisposition Call Doctor Care Advice Given Per Guideline GO TO ED NOW: * You need to be seen in the Emergency Department. ANOTHER ADULT SHOULD DRIVE: * It is better and safer if another adult drives instead of you. NOTHING BY MOUTH: CARE ADVICE given per Head Injury (Adult) guideline.  Referrals GO TO FACILITY REFUSED

## 2023-01-25 NOTE — Telephone Encounter (Signed)
See note

## 2023-01-31 ENCOUNTER — Encounter: Payer: Self-pay | Admitting: Family Medicine

## 2023-01-31 ENCOUNTER — Ambulatory Visit (INDEPENDENT_AMBULATORY_CARE_PROVIDER_SITE_OTHER)
Admission: RE | Admit: 2023-01-31 | Discharge: 2023-01-31 | Disposition: A | Discharge status: Home / Self Care | Payer: Medicare Other | Source: Ambulatory Visit | Attending: Family Medicine | Admitting: Family Medicine

## 2023-01-31 ENCOUNTER — Ambulatory Visit (INDEPENDENT_AMBULATORY_CARE_PROVIDER_SITE_OTHER): Payer: Medicare Other | Admitting: Family Medicine

## 2023-01-31 ENCOUNTER — Telehealth: Payer: Self-pay | Admitting: Family Medicine

## 2023-01-31 VITALS — BP 125/79 | HR 67 | Temp 98.6°F | Ht 62.0 in | Wt 130.8 lb

## 2023-01-31 DIAGNOSIS — R109 Unspecified abdominal pain: Secondary | ICD-10-CM | POA: Diagnosis not present

## 2023-01-31 DIAGNOSIS — F321 Major depressive disorder, single episode, moderate: Secondary | ICD-10-CM

## 2023-01-31 DIAGNOSIS — S299XXA Unspecified injury of thorax, initial encounter: Secondary | ICD-10-CM | POA: Diagnosis not present

## 2023-01-31 DIAGNOSIS — R6889 Other general symptoms and signs: Secondary | ICD-10-CM | POA: Diagnosis not present

## 2023-01-31 DIAGNOSIS — R0781 Pleurodynia: Secondary | ICD-10-CM | POA: Diagnosis not present

## 2023-01-31 LAB — COMPREHENSIVE METABOLIC PANEL
ALT: 18 U/L (ref 0–35)
AST: 18 U/L (ref 0–37)
Albumin: 4.2 g/dL (ref 3.5–5.2)
Alkaline Phosphatase: 80 U/L (ref 39–117)
BUN: 15 mg/dL (ref 6–23)
CO2: 31 meq/L (ref 19–32)
Calcium: 9.5 mg/dL (ref 8.4–10.5)
Chloride: 103 meq/L (ref 96–112)
Creatinine, Ser: 0.75 mg/dL (ref 0.40–1.20)
GFR: 82.77 mL/min (ref >60.00)
Glucose, Bld: 96 mg/dL (ref 70–99)
Potassium: 4 meq/L (ref 3.5–5.1)
Sodium: 141 meq/L (ref 135–145)
Total Bilirubin: 0.5 mg/dL (ref 0.2–1.2)
Total Protein: 6.6 g/dL (ref 6.0–8.3)

## 2023-01-31 LAB — URINALYSIS, ROUTINE W REFLEX MICROSCOPIC
Bilirubin Urine: NEGATIVE
Hgb urine dipstick: NEGATIVE
Ketones, ur: NEGATIVE
Nitrite: NEGATIVE
RBC / HPF: NONE SEEN (ref 0–?)
Specific Gravity, Urine: 1.015 (ref 1.000–1.030)
Urine Glucose: >=1000 — AB
Urobilinogen, UA: 1 (ref 0.0–1.0)
pH: 7 (ref 5.0–8.0)

## 2023-01-31 LAB — CBC
HCT: 38.8 % (ref 36.0–46.0)
Hemoglobin: 12.5 g/dL (ref 12.0–15.0)
MCHC: 32.3 g/dL (ref 30.0–36.0)
MCV: 86.7 fL (ref 78.0–100.0)
Platelets: 368 10*3/uL (ref 150.0–400.0)
RBC: 4.48 Mil/uL (ref 3.87–5.11)
RDW: 14.9 % (ref 11.5–15.5)
WBC: 7 10*3/uL (ref 4.0–10.5)

## 2023-01-31 LAB — TSH: TSH: 1.4 u[IU]/mL (ref 0.35–5.50)

## 2023-01-31 LAB — LIPASE: Lipase: 18 U/L (ref 11.0–59.0)

## 2023-01-31 MED ORDER — HYDROCODONE-ACETAMINOPHEN 5-325 MG PO TABS: 1.0000 | ORAL_TABLET | Freq: Four times a day (QID) | ORAL | 0 refills | Status: DC | PRN

## 2023-01-31 MED ORDER — ONDANSETRON HCL 4 MG PO TABS: 4.0000 mg | ORAL_TABLET | Freq: Three times a day (TID) | ORAL | 0 refills | Status: DC | PRN

## 2023-01-31 NOTE — Assessment & Plan Note (Signed)
She feels like her mood is worsened recently.  She is not sure what medication she is taking.  Discussed the patient is difficult for [place] to make any medication adjustments without being clear on what she is taking.  She is not having any SI or HI.  She is currently prescribed Cymbalta 60 mg daily but will verify this when she gets home.  If she is on Cymbalta 60 mg daily we will add on Wellbutrin 150 mg daily.  She has previously done well with this.

## 2023-01-31 NOTE — Telephone Encounter (Signed)
Left message to return call to our office at their convenience.  
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
## 2023-01-31 NOTE — Telephone Encounter (Signed)
Patient called back stating that PCP asked which antidepressant she's on but she couldn't remember at the time. States she was informed to call back. Patient is requesting to speak with Francena Hanly.

## 2023-02-02 LAB — URINE CULTURE
MICRO NUMBER:: 15572793
SPECIMEN QUALITY:: ADEQUATE

## 2023-02-02 NOTE — Progress Notes (Signed)
Her urine culture was positive.  It is possible that her symptoms may be coming from a UTI.  Recommend starting antibiotics with Macrobid 100 mg twice daily for 7 days.  She should let us know if her symptoms are not improving.  The rest of her labs including blood counts, kidney function, liver function, pancreas, and thyroid test were all normal.  We will contact her once we get results back from her x-ray and MRI.

## 2023-02-02 NOTE — Telephone Encounter (Signed)
Patient returned call. Requests to be called. 

## 2023-02-06 NOTE — Telephone Encounter (Signed)
Spoke with patient stated unsure of medication name  Requesting OV due to memory loss and confusion, possible MRI  Appointment schedule with PCP

## 2023-02-06 NOTE — Telephone Encounter (Signed)
Left message to return call to our office at their convenience.

## 2023-02-07 ENCOUNTER — Other Ambulatory Visit: Payer: Self-pay | Admitting: *Deleted

## 2023-02-07 MED ORDER — NITROFURANTOIN MONOHYD MACRO 100 MG PO CAPS: 100.0000 mg | ORAL_CAPSULE | Freq: Two times a day (BID) | ORAL | 0 refills | Status: AC

## 2023-02-10 ENCOUNTER — Other Ambulatory Visit: Payer: Self-pay | Admitting: Family Medicine

## 2023-02-12 ENCOUNTER — Ambulatory Visit (INDEPENDENT_AMBULATORY_CARE_PROVIDER_SITE_OTHER): Payer: Medicare Other | Admitting: Family Medicine

## 2023-02-12 ENCOUNTER — Encounter: Payer: Self-pay | Admitting: Family Medicine

## 2023-02-12 VITALS — BP 94/64 | HR 66 | Temp 98.0°F | Ht 62.0 in | Wt 128.0 lb

## 2023-02-12 DIAGNOSIS — R519 Headache, unspecified: Secondary | ICD-10-CM | POA: Diagnosis not present

## 2023-02-12 DIAGNOSIS — F321 Major depressive disorder, single episode, moderate: Secondary | ICD-10-CM | POA: Diagnosis not present

## 2023-02-12 MED ORDER — ONDANSETRON HCL 4 MG PO TABS: 4.0000 mg | ORAL_TABLET | Freq: Three times a day (TID) | ORAL | 3 refills | Status: DC | PRN

## 2023-02-12 MED ORDER — BUPROPION HCL ER (XL) 150 MG PO TB24: 150.0000 mg | ORAL_TABLET | Freq: Every day | ORAL | 3 refills | Status: DC

## 2023-02-12 NOTE — Patient Instructions (Addendum)
It was very nice to see you today!  We will on getting your MRI approved.   I will refill your nausea medication.  Please stop the sertraline and start the Wellbutrin.  Send me a message in a few weeks to let me know how you are doing.  Return for Follow Up.   Take care, Dr [name]  PLEASE NOTE:  If you had any lab tests, please let us know if you have not heard back within a few days. You may see your results on mychart before we have a chance to review them but we will give you a call once they are reviewed by [place].   If we ordered any referrals today, please let us know if you have not heard from their office within the next week.   If you had any urgent prescriptions sent in today, please check with the pharmacy within an hour of our visit to make sure the prescription was transmitted appropriately.   Please try these tips to maintain a healthy lifestyle:  Eat at least 3 REAL meals and 1-2 snacks per day.  Aim for no more than 5 hours between eating.  If you eat breakfast, please do so within one hour of getting up.   Each meal should contain half fruits/vegetables, one quarter protein, and one quarter carbs (no bigger than a computer mouse)  Cut down on sweet beverages. This includes juice, soda, and sweet tea.   Drink at least 1 glass of water with each meal and aim for at least 8 glasses per day  Exercise at least 150 minutes every week.

## 2023-02-12 NOTE — Assessment & Plan Note (Signed)
Elevated PHQ score today.  She has been on both Cymbalta 60 mg daily and sertraline 100 mg daily for the last few weeks.  She is not having any signs or symptoms of serotonin syndrome however her depression is not adequately controlled.  Will stop her sertraline and start Wellbutrin 150 mg daily.  Continue Cymbalta 60 mg daily.  She will follow-up with [place] in a few weeks via MyChart and we can titrate the dose of Wellbutrin as needed.  If not adequately controlled with above regimen would consider addition of low-dose atypical antipsychotics such as Abilify versus referral to psychiatry.  We discussed reasons to return to care before her scheduled follow up.

## 2023-02-12 NOTE — Progress Notes (Signed)
   Anita Diaz is a 67 y.o. female who presents today for an office visit.  Assessment/Plan:  New/Acute Problems: Urinary tract infection Resolved with recent course of Macrobid.  She will let us know if she has any return of symptoms.  Chronic Problems Addressed Today: Nonintractable headache No significant change in symptoms since her last visit.  No obvious neurodeficits or abnormal findings on exam.  Her insurance did not approve MRI we ordered a couple of weeks ago-we will work on getting this approved.  If we are not able to get this approved we will refer to neurology.  May need referral to neurology depending on results of MRI regardless.  We discussed reasons to return to care and seek urgent care.  Depression, major, single episode, moderate (HCC) Elevated PHQ score today.  She has been on both Cymbalta 60 mg daily and sertraline 100 mg daily for the last few weeks.  She is not having any signs or symptoms of serotonin syndrome however her depression is not adequately controlled.  Will stop her sertraline and start Wellbutrin 150 mg daily.  Continue Cymbalta 60 mg daily.  She will follow-up with [place] in a few weeks via MyChart and we can titrate the dose of Wellbutrin as needed.  If not adequately controlled with above regimen would consider addition of low-dose atypical antipsychotics such as Abilify versus referral to psychiatry.  We discussed reasons to return to care before her scheduled follow up.      Subjective:  HPI:  See A/p for status of chronic conditions. Patient is here today for follow up.  We last saw her 12 days ago for multiple issues.  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  We ordered MRI however insurance did not approve this.  She is still having daily headaches.  Symptoms have not changed since our last visit.  Still having some word finding difficulties  no weakness or numbness.  No tingling.  No loss of consciousness.  She was also having some flank and abdominal pain.  Her urine culture was positive for UTI and we treated with 7 days of Macrobid. Her symptoms have improved.   She would also like to discuss her depression today.  At her last office visit we had tried adding on Wellbutrin 150 mg daily however she was not able to pick up this from the pharmacy.  Apparently sertraline 100 mg was picked up.  This was prescribed to her previously however we switched to Cymbalta several months ago.  She is now on Cymbalta 60 mg daily and sertraline 100 mg daily.  She is not having any signs or symptoms of serotonin syndrome however does note that her depression is not controlled.        Objective:  Physical Exam: BP 94/64   Pulse 66   Temp 98 F (36.7 C) (Temporal)   Ht 5\' 2"  (1.575 m)   Wt 128 lb (58.1 kg)   SpO2 98%   BMI 23.41 kg/m   Gen: No acute distress, resting comfortably Neuro: Grossly normal, moves all extremities Psych: Normal affect and thought content      Gayathri Futrell M. [name], MD 02/12/2023 7:47 AM

## 2023-02-12 NOTE — Assessment & Plan Note (Signed)
No significant change in symptoms since her last visit.  No obvious neurodeficits or abnormal findings on exam.  Her insurance did not approve MRI we ordered a couple of weeks ago-we will work on getting this approved.  If we are not able to get this approved we will refer to neurology.  May need referral to neurology depending on results of MRI regardless.  We discussed reasons to return to care and seek urgent care.

## 2023-02-14 ENCOUNTER — Ambulatory Visit (HOSPITAL_COMMUNITY)
Admission: RE | Admit: 2023-02-14 | Discharge: 2023-02-14 | Disposition: A | Discharge status: Home / Self Care | Payer: Medicare Other | Source: Ambulatory Visit | Attending: Orthopedic Surgery | Admitting: Orthopedic Surgery

## 2023-02-14 ENCOUNTER — Other Ambulatory Visit: Payer: Self-pay | Admitting: *Deleted

## 2023-02-14 DIAGNOSIS — M75122 Complete rotator cuff tear or rupture of left shoulder, not specified as traumatic: Secondary | ICD-10-CM | POA: Diagnosis not present

## 2023-02-14 DIAGNOSIS — M75100 Unspecified rotator cuff tear or rupture of unspecified shoulder, not specified as traumatic: Secondary | ICD-10-CM | POA: Diagnosis not present

## 2023-02-14 DIAGNOSIS — M19012 Primary osteoarthritis, left shoulder: Secondary | ICD-10-CM | POA: Diagnosis not present

## 2023-02-14 DIAGNOSIS — R519 Headache, unspecified: Secondary | ICD-10-CM

## 2023-02-14 DIAGNOSIS — M75102 Unspecified rotator cuff tear or rupture of left shoulder, not specified as traumatic: Secondary | ICD-10-CM | POA: Diagnosis not present

## 2023-02-14 DIAGNOSIS — M7582 Other shoulder lesions, left shoulder: Secondary | ICD-10-CM | POA: Diagnosis not present

## 2023-02-23 ENCOUNTER — Other Ambulatory Visit: Payer: Self-pay | Admitting: Family Medicine

## 2023-02-23 NOTE — Addendum Note (Signed)
Addended by: Ardith Dark on: 02/23/2023 01:42 PM   Modules accepted: Orders

## 2023-02-26 ENCOUNTER — Telehealth: Payer: Self-pay | Admitting: Family Medicine

## 2023-02-26 NOTE — Telephone Encounter (Signed)
Patient states she has been receiving more calls from [facility] to get her scheduled for her MRI. States [facility] needs the imaging order canceled so they will stop calling her to get scheduled. Please cancel the initial order placed on 02/14/23 for MR Brain WO Contrast.

## 2023-02-27 NOTE — Telephone Encounter (Signed)
Ok to cancelled?

## 2023-02-28 NOTE — Telephone Encounter (Signed)
Ok with me. Please place any necessary orders. 

## 2023-02-28 NOTE — Progress Notes (Signed)
Xray is normal. No fractures.

## 2023-02-28 NOTE — Telephone Encounter (Signed)
MRI cancelled 

## 2023-03-07 ENCOUNTER — Other Ambulatory Visit (HOSPITAL_COMMUNITY): Payer: Self-pay | Admitting: Family Medicine

## 2023-03-07 ENCOUNTER — Other Ambulatory Visit: Payer: Self-pay | Admitting: Orthopedic Surgery

## 2023-03-07 DIAGNOSIS — S46012A Strain of muscle(s) and tendon(s) of the rotator cuff of left shoulder, initial encounter: Secondary | ICD-10-CM | POA: Diagnosis not present

## 2023-03-07 DIAGNOSIS — R519 Headache, unspecified: Secondary | ICD-10-CM

## 2023-03-07 DIAGNOSIS — Z01818 Encounter for other preprocedural examination: Secondary | ICD-10-CM

## 2023-03-14 ENCOUNTER — Telehealth: Payer: Self-pay | Admitting: Cardiology

## 2023-03-14 ENCOUNTER — Ambulatory Visit
Admission: RE | Admit: 2023-03-14 | Discharge: 2023-03-14 | Disposition: A | Discharge status: Home / Self Care | Payer: Medicare Other | Source: Ambulatory Visit | Attending: Orthopedic Surgery | Admitting: Orthopedic Surgery

## 2023-03-14 DIAGNOSIS — M19012 Primary osteoarthritis, left shoulder: Secondary | ICD-10-CM | POA: Diagnosis not present

## 2023-03-14 DIAGNOSIS — M25512 Pain in left shoulder: Secondary | ICD-10-CM | POA: Diagnosis not present

## 2023-03-14 DIAGNOSIS — M25412 Effusion, left shoulder: Secondary | ICD-10-CM | POA: Diagnosis not present

## 2023-03-14 DIAGNOSIS — Z01818 Encounter for other preprocedural examination: Secondary | ICD-10-CM

## 2023-03-14 NOTE — Telephone Encounter (Signed)
Paper Work Dropped Off: APPLICATION  Date:03/14/2023  Location of paper: IN DR. Anne Fu  BOX

## 2023-03-15 ENCOUNTER — Ambulatory Visit (INDEPENDENT_AMBULATORY_CARE_PROVIDER_SITE_OTHER): Payer: Medicare Other

## 2023-03-15 DIAGNOSIS — I428 Other cardiomyopathies: Secondary | ICD-10-CM | POA: Diagnosis not present

## 2023-03-16 ENCOUNTER — Telehealth: Payer: Self-pay

## 2023-03-16 NOTE — Telephone Encounter (Signed)
   Pre-operative Risk Assessment    Patient Name: Anita Diaz  DOB: 01-03-1956 MRN: [id number]     Request for Surgical Clearance    Procedure:  Left Reverse Total Shoulder Replacement   Date of Surgery:  Clearance TBD                                 Surgeon:  Dr. Teryl Lucy Surgeon's Group or Practice Name:  [facility]  Phone number:  [phone] Fax number:  [phone]   Type of Clearance Requested:   - Medical    Type of Anesthesia:  Interscalene block    Additional requests/questions:    SignedVernard Gambles   03/16/2023, 12:27 PM

## 2023-03-19 NOTE — Telephone Encounter (Signed)
Paperwork completed/signed by MD.  Take to be scanned to pharmD assist for pt assistance.

## 2023-03-20 ENCOUNTER — Telehealth: Payer: Self-pay

## 2023-03-20 NOTE — Telephone Encounter (Signed)
  Patient Consent for Virtual Visit        Anita Diaz has provided verbal consent on 03/20/2023 for a virtual visit (video or telephone).   CONSENT FOR VIRTUAL VISIT FOR:  Anita Diaz  By participating in this virtual visit I agree to the following:  I hereby voluntarily request, consent and authorize [facility] and its employed or contracted physicians, physician assistants, nurse practitioners or other licensed health care professionals (the Practitioner), to provide me with telemedicine health care services (the "Services") as deemed necessary by the treating Practitioner. I acknowledge and consent to receive the Services by the Practitioner via telemedicine. I understand that the telemedicine visit will involve communicating with the Practitioner through live audiovisual communication technology and the disclosure of certain medical information by electronic transmission. I acknowledge that I have been given the opportunity to request an in-person assessment or other available alternative prior to the telemedicine visit and am voluntarily participating in the telemedicine visit.  I understand that I have the right to withhold or withdraw my consent to the use of telemedicine in the course of my care at any time, without affecting my right to future care or treatment, and that the Practitioner or I may terminate the telemedicine visit at any time. I understand that I have the right to inspect all information obtained and/or recorded in the course of the telemedicine visit and may receive copies of available information for a reasonable fee.  I understand that some of the potential risks of receiving the Services via telemedicine include:  Delay or interruption in medical evaluation due to technological equipment failure or disruption; Information transmitted may not be sufficient (e.g. poor resolution of images) to allow for appropriate medical decision making by the  Practitioner; and/or  In rare instances, security protocols could fail, causing a breach of personal health information.  Furthermore, I acknowledge that it is my responsibility to provide information about my medical history, conditions and care that is complete and accurate to the best of my ability. I acknowledge that Practitioner's advice, recommendations, and/or decision may be based on factors not within their control, such as incomplete or inaccurate data provided by me or distortions of diagnostic images or specimens that may result from electronic transmissions. I understand that the practice of medicine is not an exact science and that Practitioner makes no warranties or guarantees regarding treatment outcomes. I acknowledge that a copy of this consent can be made available to me via my patient portal [facility] MyChart), or I can request a printed copy by calling the office of [facility].    I understand that my insurance will be billed for this visit.   I have read or had this consent read to me. I understand the contents of this consent, which adequately explains the benefits and risks of the Services being provided via telemedicine.  I have been provided ample opportunity to ask questions regarding this consent and the Services and have had my questions answered to my satisfaction. I give my informed consent for the services to be provided through the use of telemedicine in my medical care

## 2023-03-20 NOTE — Telephone Encounter (Signed)
Preop clearance tele visit now scheduled, med rec and consent done

## 2023-03-20 NOTE — Telephone Encounter (Signed)
   Name: Anita Diaz  DOB: 1955-09-12  MRN: [id number]  Primary Cardiologist: Donato Schultz, MD  Chart reviewed as part of pre-operative protocol coverage. Because of Anita Diaz's past medical history and time since last visit, she will require a follow-up telephone visit in order to better assess preoperative cardiovascular risk.  Pre-op covering staff: - Please schedule appointment and call patient to inform them. If patient already had an upcoming appointment within acceptable timeframe, please add "pre-op clearance" to the appointment notes so provider is aware. - Please contact requesting surgeon's office via preferred method (i.e, phone, fax) to inform them of need for appointment prior to surgery.  No medications indicated as needing held.  Sharlene Dory, PA-C  03/20/2023, 12:01 PM

## 2023-03-21 ENCOUNTER — Telehealth: Payer: Self-pay

## 2023-03-21 LAB — CUP PACEART REMOTE DEVICE CHECK
Battery Remaining Longevity: 84 mo
Battery Remaining Percentage: 95 %
Brady Statistic RA Percent Paced: 2 %
Brady Statistic RV Percent Paced: 1 %
Date Time Interrogation Session: 20241126180100
HighPow Impedance: 77 Ohm
Lead Channel Impedance Value: 411 Ohm
Lead Channel Impedance Value: 416 Ohm
Lead Channel Impedance Value: 543 Ohm
Lead Channel Setting Pacing Amplitude: 2 V
Lead Channel Setting Pacing Amplitude: 2.4 V
Lead Channel Setting Pacing Amplitude: 2.6 V
Lead Channel Setting Pacing Pulse Width: 0.4 ms
Lead Channel Setting Pacing Pulse Width: 0.4 ms
Lead Channel Setting Sensing Sensitivity: 0.5 mV
Lead Channel Setting Sensing Sensitivity: 1 mV
Pulse Gen Serial Number: 215046
Zone Setting Status: 755011

## 2023-03-21 NOTE — Telephone Encounter (Signed)
Patient Advocate Encounter   The patient was approved for a [employer] grant that will help cover the cost of ENTRESTO Total amount awarded, $10,000.  Effective: 01/21/23 - 01/20/24   [id number]   Haze Rushing, CPhT  Pharmacy Patient Advocate Specialist  Direct Number: [phone] Fax: [phone]

## 2023-03-21 NOTE — Telephone Encounter (Signed)
Received, thanks Rinaldo Cloud!

## 2023-03-22 ENCOUNTER — Other Ambulatory Visit: Payer: Self-pay | Admitting: Family Medicine

## 2023-03-23 ENCOUNTER — Other Ambulatory Visit: Payer: Self-pay | Admitting: Family Medicine

## 2023-03-23 ENCOUNTER — Other Ambulatory Visit (HOSPITAL_COMMUNITY): Payer: Self-pay

## 2023-03-23 NOTE — Telephone Encounter (Signed)
 PAP: Application for Sherryll Burger has been submitted to PAP Companies: [employer], via fax If update is requested in the meantime, please refer to [employer] at [phone]

## 2023-03-25 ENCOUNTER — Other Ambulatory Visit: Payer: Self-pay | Admitting: Family Medicine

## 2023-03-27 NOTE — Telephone Encounter (Signed)
PAP: Patient has been denied for pt assistance by PAP Companies: [employer] due to MISSING TAX RETURN OR PROOF OF INCOME AND COPY OF EXTRA HELP DENIAL LETTER. LETTER HAS BEEN SENT TO PATIENT.

## 2023-04-03 ENCOUNTER — Encounter: Payer: Self-pay | Admitting: Family Medicine

## 2023-04-03 ENCOUNTER — Ambulatory Visit (INDEPENDENT_AMBULATORY_CARE_PROVIDER_SITE_OTHER): Payer: Medicare Other | Admitting: Family Medicine

## 2023-04-03 VITALS — BP 94/64 | HR 84 | Temp 98.0°F | Ht 62.0 in | Wt 133.0 lb

## 2023-04-03 DIAGNOSIS — R051 Acute cough: Secondary | ICD-10-CM | POA: Diagnosis not present

## 2023-04-03 DIAGNOSIS — B9689 Other specified bacterial agents as the cause of diseases classified elsewhere: Secondary | ICD-10-CM

## 2023-04-03 DIAGNOSIS — I5020 Unspecified systolic (congestive) heart failure: Secondary | ICD-10-CM

## 2023-04-03 DIAGNOSIS — J9801 Acute bronchospasm: Secondary | ICD-10-CM | POA: Diagnosis not present

## 2023-04-03 LAB — POC COVID19 BINAXNOW: SARS Coronavirus 2 Ag: NEGATIVE

## 2023-04-03 MED ORDER — GUAIFENESIN-CODEINE 100-10 MG/5ML PO SOLN
5.0000 mL | Freq: Every evening | ORAL | 0 refills | Status: DC | PRN
Start: 2023-04-03 — End: 2023-05-16

## 2023-04-03 MED ORDER — PREDNISONE 20 MG PO TABS: ORAL_TABLET | ORAL | 0 refills | Status: DC

## 2023-04-03 MED ORDER — AZITHROMYCIN 250 MG PO TABS: ORAL_TABLET | ORAL | 0 refills | Status: DC

## 2023-04-03 NOTE — Progress Notes (Signed)
Subjective  CC:  Chief Complaint  Patient presents with   Cough    Pt stated that she has been coughing for the past 2/3 weeks. It has not gotten worse but it does feels like it is just sitting in her chest. She has been using her nebulizer to help losing it up and it has seemed to have help some but still have the cough.     HPI: SUBJECTIVE:  Anita Diaz is a 67 y.o. female with h/o CHF who complains of chest congestion, nasal blockage, post nasal drip, cough described as paroxysmal and productive and denies sinus, high fevers, SOB, chest pain or significant GI symptoms. She has h/o wheezing and has had chest tightness responsive to albuterol nebs. Symptoms have been present for almost 3 weeks now and not improving. No chest pain or palpitations. No edema.. She denies a history of anorexia, dizziness, and vomiting. She denies a history of asthma or COPD.  Assessment  1. Acute bacterial bronchitis   2. Acute cough   3. Bronchospasm   4. Heart failure with reduced ejection fraction (HCC)      Plan  Discussion:  Treat for bacterial bronchitis due to prolonged course and worsening symptoms. Education regarding differences between viral and bacterial infections and treatment options are discussed.  Supportive care measures are recommended.  We discussed the use of mucolytic's, decongestants, antihistamines and antitussives as needed.  Tylenol or Advil are recommended if needed. -Zpak, short pred burst x 3 days, albuterol nebs, rob AC and mucinex DM  Follow up: if not improving   Orders Placed This Encounter  Procedures   POC COVID-19   Meds ordered this encounter  Medications   azithromycin (ZITHROMAX) 250 MG tablet    Sig: Take 2 tabs today, then 1 tab daily for 4 days    Dispense:  1 each    Refill:  0   predniSONE (DELTASONE) 20 MG tablet    Sig: Take 3 tabs daily for 3 days    Dispense:  9 tablet    Refill:  0   guaiFENesin-codeine 100-10 MG/5ML syrup    Sig: Take 5-10  mLs by mouth at bedtime as needed for cough.    Dispense:  120 mL    Refill:  0      I reviewed the patients updated PMH, FH, and SocHx.  Social History: Patient  reports that she quit smoking about 14 years ago. Her smoking use included cigarettes. She started smoking about 51 years ago. She has a 74 pack-year smoking history. She has never used smokeless tobacco. She reports current alcohol use. She reports that she does not use drugs.  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   SVT (supraventricular tachycardia) (HCC) 06/17/2019   TMJ syndrome 03/01/2018   Chronic systolic heart failure (HCC) 11/08/2017   B12 deficiency 10/30/2017   Chronic left shoulder pain 08/29/2017   Neck pain 08/29/2017   Other fatigue 08/29/2017   Heart failure with reduced ejection fraction (HCC) 08/01/2017   Osteoarthritis, 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   atorvastatin (LIPITOR) 40 MG tablet Take 1 tablet by mouth once daily   Azelastine HCl 137 MCG/SPRAY SOLN USE 2 SPRAY(S) IN EACH NOSTRIL TWICE DAILY   azithromycin (ZITHROMAX) 250  MG tablet Take 2 tabs today, then 1 tab daily for 4 days   buPROPion (WELLBUTRIN XL) 150 MG 24 hr tablet Take 1 tablet (150 mg total) by mouth daily.   carvedilol (COREG) 6.25 MG tablet TAKE 1 TABLET BY MOUTH TWICE DAILY WITH A MEAL   clopidogrel (PLAVIX) 75 MG tablet Take 1 tablet by mouth once daily   diazepam (VALIUM) 5 MG tablet TAKE 1 TABLET BY MOUTH EVERY 12 HOURS AS NEEDED FOR ANXIETY   DULoxetine (CYMBALTA) 60 MG capsule Take 1 capsule by mouth once daily   empagliflozin ([name]) 10 MG TABS tablet Take 1 tablet (10 mg total) by mouth daily before breakfast.   ezetimibe (ZETIA) 10 MG tablet TAKE 1 TABLET BY MOUTH ONCE DAILY . APPOINTMENT REQUIRED FOR FUTURE REFILLS   furosemide (LASIX) 20 MG tablet Take 1 tablet by mouth once daily   gabapentin (NEURONTIN) 300 MG capsule Take 300 mg by mouth 2 (two) times daily.   guaiFENesin-codeine 100-10 MG/5ML syrup Take 5-10 mLs by mouth at bedtime as needed for cough.   HYDROcodone-acetaminophen (NORCO) 5-325 MG tablet Take 1 tablet by mouth every 6 (six) hours as needed for moderate pain.   levocetirizine (XYZAL) 5 MG tablet Take 5 mg by mouth every evening.   loperamide (IMODIUM A-D) 2 MG tablet Take 4 mg by mouth as needed.   nitroGLYCERIN (NITROSTAT) 0.4 MG SL tablet PLACE 1 TABLET UNDER THE TONGUE  EVERY 5 MINUTES AS NEEDED FOR CHEST PAIN   ondansetron (ZOFRAN) 4 MG tablet Take 1 tablet (4 mg total) by mouth every 8 (eight) hours as needed for nausea or vomiting.   pantoprazole (PROTONIX) 40 MG tablet TAKE 1 TABLET BY MOUTH ONCE DAILY . APPOINTMENT REQUIRED FOR FUTURE REFILLS [phone]   predniSONE (DELTASONE) 20 MG tablet Take 3 tabs daily for 3 days   sacubitril-valsartan (ENTRESTO) 24-26 MG Take 1 tablet by mouth 2 (two) times daily.    Objective  Vitals: BP 94/64   Pulse 84   Temp 98 F (36.7 C)   Ht 5\' 2"  (1.575 m)   Wt 133 lb (60.3 kg)   SpO2 96%   BMI 24.33 kg/m  General: no acute distress no respiratory distress Psych:   Alert and oriented, normal mood and affect HEENT:  Normocephalic, atraumatic, supple neck, moist mucous membranes, mildly erythematous pharynx without exudate, no LAD Cardiovascular:  RRR  Respiratory:  Good breath sounds bilaterally, CTAB with normal respiratory effort with occasional rhonchi, no wheeze (did nebulizer right before visit today)  Office Visit on 04/03/2023  Component Date Value Ref Range Status   SARS Coronavirus 2 Ag 04/03/2023 Negative  Negative Final    Commons side effects, risks, benefits, and alternatives for medications and treatment plan prescribed today were discussed, and the patient expressed understanding of the given instructions. Patient is instructed to call or message via MyChart if he/she has any  questions or concerns regarding our treatment plan. No barriers to understanding were identified. We discussed Red Flag symptoms and signs in detail. Patient expressed understanding regarding what to do in case of urgent or emergency type symptoms.  Medication list was reconciled, printed and provided to the patient in AVS. Patient instructions and summary information was reviewed with the patient as documented in the AVS. This note was prepared with assistance of Dragon voice recognition software. Occasional wrong-word or sound-a-like substitutions may have occurred due to the inherent limitations of voice recognition software

## 2023-04-03 NOTE — Patient Instructions (Signed)
Please follow up if symptoms do not improve or as needed.    I have prescribed a Z-Pak, antibiotic to help clear your bronchitis infection.  I have also prescribed prednisone and a nighttime cough syrup.  The cough syrup has codeine so please do not take with your pain pills. You may use Mucinex DM twice a day to help with mucus thinning and coughing during the day.  Please let me know if things are not getting better.  Acute Bronchitis, Adult  Acute bronchitis is sudden inflammation of the main airways (bronchi) that come off the windpipe (trachea) in the lungs. The swelling causes the airways to get smaller and make more mucus than normal. This can make it hard to breathe and can cause coughing or noisy breathing (wheezing). Acute bronchitis may last several weeks. The cough may last longer. Allergies, asthma, and exposure to smoke may make the condition worse. What are the causes? This condition can be caused by germs and by substances that irritate the lungs, including: Cold and flu viruses. The most common cause of this condition is the virus that causes the common cold. Bacteria. This is less common. Breathing in substances that irritate the lungs, including: Smoke from cigarettes and other forms of tobacco. Dust and pollen. Fumes from household cleaning products, gases, or burned fuel. Indoor or outdoor air pollution. What increases the risk? The following factors may make you more likely to develop this condition: A weak body's defense system, also called the immune system. A condition that affects your lungs and breathing, such as asthma. What are the signs or symptoms? Common symptoms of this condition include: Coughing. This may bring up clear, yellow, or green mucus from your lungs (sputum). Wheezing. Runny or stuffy nose. Having too much mucus in your lungs (chest congestion). Shortness of breath. Aches and pains, including sore throat or chest. How is this  diagnosed? This condition is usually diagnosed based on: Your symptoms and medical history. A physical exam. You may also have other tests, including tests to rule out other conditions, such as pneumonia. These tests include: A test of lung function. Test of a mucus sample to look for the presence of bacteria. Tests to check the oxygen level in your blood. Blood tests. Chest X-ray. How is this treated? Most cases of acute bronchitis clear up over time without treatment. Your health care provider may recommend: Drinking more fluids to help thin your mucus so it is easier to cough up. Taking inhaled medicine (inhaler) to improve air flow in and out of your lungs. Using a vaporizer or a humidifier. These are machines that add water to the air to help you breathe better. Taking a medicine that thins mucus and clears congestion (expectorant). Taking a medicine that prevents or stops coughing (cough suppressant). It is not common to take an antibiotic medicine for this condition. Follow these instructions at home:  Take over-the-counter and prescription medicines only as told by your health care provider. Use an inhaler, vaporizer, or humidifier as told by your health care provider. Take two teaspoons (10 mL) of honey at bedtime to lessen coughing at night. Drink enough fluid to keep your urine pale yellow. Do not use any products that contain nicotine or tobacco. These products include cigarettes, chewing tobacco, and vaping devices, such as e-cigarettes. If you need help quitting, ask your health care provider. Get plenty of rest. Return to your normal activities as told by your health care provider. Ask your health care provider what  activities are safe for you. Keep all follow-up visits. This is important. How is this prevented? To lower your risk of getting this condition again: Wash your hands often with soap and water for at least 20 seconds. If soap and water are not available, use  hand sanitizer. Avoid contact with people who have cold symptoms. Try not to touch your mouth, nose, or eyes with your hands. Avoid breathing in smoke or chemical fumes. Breathing smoke or chemical fumes will make your condition worse. Get the flu shot every year. Contact a health care provider if: Your symptoms do not improve after 2 weeks. You have trouble coughing up the mucus. Your cough keeps you awake at night. You have a fever. Get help right away if you: Cough up blood. Feel pain in your chest. Have severe shortness of breath. Faint or keep feeling like you are going to faint. Have a severe headache. Have a fever or chills that get worse. These symptoms may represent a serious problem that is an emergency. Do not wait to see if the symptoms will go away. Get medical help right away. Call your local emergency services (911 in the [place] Do not drive yourself to the hospital. Summary Acute bronchitis is inflammation of the main airways (bronchi) that come off the windpipe (trachea) in the lungs. The swelling causes the airways to get smaller and make more mucus than normal. Drinking more fluids can help thin your mucus so it is easier to cough up. Take over-the-counter and prescription medicines only as told by your health care provider. Do not use any products that contain nicotine or tobacco. These products include cigarettes, chewing tobacco, and vaping devices, such as e-cigarettes. If you need help quitting, ask your health care provider. Contact a health care provider if your symptoms do not improve after 2 weeks. This information is not intended to replace advice given to you by your health care provider. Make sure you discuss any questions you have with your health care provider. Document Revised: 07/21/2021 Document Reviewed: 08/11/2020 Elsevier Patient Education  2024 [employer].

## 2023-04-10 NOTE — Progress Notes (Signed)
Remote ICD transmission.   

## 2023-04-10 NOTE — Addendum Note (Signed)
Addended by: Elease Etienne A on: 04/10/2023 12:48 PM   Modules accepted: Orders

## 2023-04-16 ENCOUNTER — Ambulatory Visit: Payer: Medicare Other | Attending: Cardiology | Admitting: General Practice

## 2023-04-16 DIAGNOSIS — Z01810 Encounter for preprocedural cardiovascular examination: Secondary | ICD-10-CM | POA: Diagnosis not present
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
## 2023-04-26 ENCOUNTER — Other Ambulatory Visit: Payer: Self-pay | Admitting: Family Medicine

## 2023-04-27 ENCOUNTER — Telehealth: Payer: Self-pay | Admitting: Family Medicine

## 2023-04-27 NOTE — Telephone Encounter (Signed)
 Referral team, can you assist?

## 2023-04-27 NOTE — Telephone Encounter (Signed)
 Copied from CRM [id number]. Topic: Clinical - Prescription Issue >> Apr 27, 2023  1:46 PM Laurier C wrote: Reason for CRM: [facility] is calling to get prior auth initiated for a MRI scheduled for 05/01/2023 [facility] with [facility] [phone] ext [phone]

## 2023-04-30 NOTE — Telephone Encounter (Signed)
 Auth# [id number] expires 05/29/23. Pt notified of approval received and she is scheduled at [facility] tomorrow 05/01/23, Arrive at 7:30 am - Exam at 8:00 am.

## 2023-05-01 ENCOUNTER — Ambulatory Visit (HOSPITAL_COMMUNITY): Admission: RE | Admit: 2023-05-01 | Payer: Medicare Other | Source: Ambulatory Visit

## 2023-05-01 ENCOUNTER — Ambulatory Visit (HOSPITAL_COMMUNITY)
Admission: RE | Admit: 2023-05-01 | Discharge: 2023-05-01 | Disposition: A | Discharge status: Home / Self Care | Payer: Medicare Other | Source: Ambulatory Visit | Attending: Family Medicine | Admitting: Family Medicine

## 2023-05-01 ENCOUNTER — Encounter (HOSPITAL_COMMUNITY): Payer: Self-pay | Admitting: Family Medicine

## 2023-05-01 ENCOUNTER — Other Ambulatory Visit (HOSPITAL_COMMUNITY): Payer: Self-pay | Admitting: Family Medicine

## 2023-05-01 DIAGNOSIS — R519 Headache, unspecified: Secondary | ICD-10-CM

## 2023-05-03 ENCOUNTER — Telehealth: Payer: Self-pay | Admitting: *Deleted

## 2023-05-03 NOTE — Telephone Encounter (Signed)
 Copied from CRM [id number]. Topic: Clinical - Medication Question >> May 03, 2023 10:44 AM Anita Diaz wrote: Reason for CRM: pt is requesting a call back regarding surgery clearance. Please call pt at [phone]   Spoke with patient, patient stated form was faxed today  Notified patient once we received it will call her to let her now when we faxed form to surgeon

## 2023-05-03 NOTE — Telephone Encounter (Signed)
Form placed in PCP office to be reviewed  

## 2023-05-03 NOTE — Telephone Encounter (Signed)
 Pt called in stating Anita Diaz never received clearance,they would like it faxed over please.

## 2023-05-03 NOTE — Telephone Encounter (Signed)
 Clearance Re-Faxed to requesting office

## 2023-05-04 ENCOUNTER — Telehealth: Payer: Self-pay | Admitting: Cardiology

## 2023-05-04 NOTE — Telephone Encounter (Signed)
 Pt states Anita Diaz said they still do not have pre-op clearance.

## 2023-05-04 NOTE — Telephone Encounter (Signed)
 I s/w the pt and assured her that I will re-fax the clearance notes from 04/16/23 from Edd Fabian, FNP. Pt thanked me for the help and the call back today.

## 2023-05-08 NOTE — Telephone Encounter (Signed)
 Anita Diaz from [facility] calling stating that they received the clearance but it did not address the Plavix. Needs something in writing. [phone] ph [phone] fax.

## 2023-05-08 NOTE — Telephone Encounter (Signed)
 I will forward this to pre op APP and to Edd Fabian, FNP in regard to Plavix. Not sure if Plavix was not noted as to be held on the clearance.

## 2023-05-08 NOTE — Telephone Encounter (Signed)
 Thank you Edd Fabian, FNP. I will update the requesting office they will need to contact PCP about Plavix hold.

## 2023-05-09 ENCOUNTER — Telehealth: Payer: Self-pay | Admitting: Cardiology

## 2023-05-09 NOTE — Telephone Encounter (Signed)
 Spoke with patient, she wanted to check on her patient assistance for this year and what would be required. Forwarding to our patient assistance team

## 2023-05-09 NOTE — Telephone Encounter (Signed)
 Pt c/o medication issue:  1. Name of Medication:   sacubitril -valsartan  (ENTRESTO ) 24-26 MG  empagliflozin  ([name] ) 10 MG TABS tablet   2. How are you currently taking this medication (dosage and times per day)?   3. Are you having a reaction (difficulty breathing--STAT)? No  4. What is your medication issue? Pt would like a c/b regarding above medications as well as Entresto  refill being sent to [facility]. Please advise

## 2023-05-10 ENCOUNTER — Other Ambulatory Visit (HOSPITAL_COMMUNITY): Payer: Self-pay

## 2023-05-10 ENCOUNTER — Telehealth: Payer: Self-pay | Admitting: Pharmacy Technician

## 2023-05-10 ENCOUNTER — Encounter: Payer: Self-pay | Admitting: Pharmacy Technician

## 2023-05-10 NOTE — Telephone Encounter (Signed)
Copied from CRM [id number]. Topic: General - Other >> May 10, 2023  8:28 AM Fredrich Romans wrote: Reason for CRM: Cordelia Pen from [facility] called stating that the clearance that was sent back needs a signature fro Dr [name] to clear patient for surgery from dr parker,and  also permission to stop plavix.She said that they already have the cardiac clearance.

## 2023-05-10 NOTE — Telephone Encounter (Signed)
Patient Advocate Encounter   The patient was approved for a [employer] grant that will help cover the cost of jardiance Total amount awarded, $10,000.  Effective: 04/10/23 - 04/08/24   [id number] LKG:[id number] [id number]  I sent the patient a mychart message to let her know about the approval.  As far as the entresto that's still approved until 12/2023 so she just needs a refill as requested. Thank you

## 2023-05-10 NOTE — Telephone Encounter (Signed)
Patient Advocate Encounter   The patient was approved for a [employer] grant that will help cover the cost of jardiance Total amount awarded, $10,000.  Effective: 04/10/23 - 04/08/24   WUJ:[id number] NWG:[id number] [id number] [id number]

## 2023-05-11 ENCOUNTER — Encounter: Payer: Self-pay | Admitting: Family Medicine

## 2023-05-11 DIAGNOSIS — M25512 Pain in left shoulder: Secondary | ICD-10-CM | POA: Diagnosis not present

## 2023-05-11 DIAGNOSIS — S46012D Strain of muscle(s) and tendon(s) of the rotator cuff of left shoulder, subsequent encounter: Secondary | ICD-10-CM | POA: Diagnosis not present

## 2023-05-11 NOTE — Progress Notes (Signed)
Her MRI shows a few nonspecific findings however it does show that she may have increased pressure in her brain.  Recommend referral to neurology for further evaluation and management especially if she is still having headaches.

## 2023-05-14 ENCOUNTER — Other Ambulatory Visit (HOSPITAL_BASED_OUTPATIENT_CLINIC_OR_DEPARTMENT_OTHER): Payer: Medicare Other | Admitting: Radiology

## 2023-05-15 NOTE — Progress Notes (Signed)
Noted. She should let us know if she changes her mind however strongly recommend neurology referral at this time if she is still having headaches.

## 2023-05-16 ENCOUNTER — Telehealth: Payer: Self-pay | Admitting: Cardiology

## 2023-05-16 MED ORDER — SACUBITRIL-VALSARTAN 24-26 MG PO TABS: 1.0000 | ORAL_TABLET | Freq: Two times a day (BID) | ORAL | 2 refills | Status: DC

## 2023-05-16 NOTE — Telephone Encounter (Signed)
Pt c/o medication issue:  1. Name of Medication:   sacubitril-valsartan (ENTRESTO) 24-26 MG    2. How are you currently taking this medication (dosage and times per day)?   Take 1 tablet by mouth 2 (two) times daily.    3. Are you having a reaction (difficulty breathing--STAT)? No   4. What is your medication issue? Pt is calling back and stated that she spoke with [facility] pharmacy again and was told they did not receive the refill we sent today. She was advised to call them directly to provide a verbal order for her refill. She gave phone number  [phone]

## 2023-05-16 NOTE — Progress Notes (Signed)
Sent message, via epic in basket, requesting orders in epic from surgeon.  

## 2023-05-16 NOTE — Telephone Encounter (Signed)
The RX was not seen yet so, I did a phone order with Pharmacy to ensure that the refill would be sent in ASAP.

## 2023-05-16 NOTE — Telephone Encounter (Signed)
*  STAT* If patient is at the pharmacy, call can be transferred to refill team.   1. Which medications need to be refilled? (please list name of each medication and dose if known)   sacubitril-valsartan (ENTRESTO) 24-26 MG      4. Which pharmacy/location (including street and city if local pharmacy) is medication to be sent to? [facility] - [place], [place] - [address]     5. Do they need a 30 day or 90 day supply? 90    Pt states is completely out

## 2023-05-17 NOTE — Progress Notes (Signed)
Anesthesia Review:  PCP: Jacquiline Doe  Cardiologist : Anne Fu- LOV 05/04/23 Edd Fabian preop clearance telephone 04/16/23  Pacemaker- device check 03/21/23  Chest x-ray : 05/07/23- 2 view  EKG : 08/02/2022  Echo : Stress t 2022 est: Cardiac Cath :  Activity level:  Sleep Study/ CPAP : Fasting Blood Sugar :      / Checks Blood Sugar -- times a day:   Blood Thinner/ Instructions /Last Dose: ASA / Instructions/ Last Dose :    Plavix

## 2023-05-17 NOTE — Patient Instructions (Addendum)
SURGICAL WAITING ROOM VISITATION  Patients having surgery or a procedure may have no more than 2 support people in the waiting area - these visitors may rotate.    Children under the age of 38 must have an adult with them who is not the patient.  Due to an increase in RSV and influenza rates and associated hospitalizations, children ages 23 and under may not visit patients in [facility] hospitals.  Visitors with respiratory illnesses are discouraged from visiting and should remain at home.  If the patient needs to stay at the hospital during part of their recovery, the visitor guidelines for inpatient rooms apply. Pre-op nurse will coordinate an appropriate time for 1 support person to accompany patient in pre-op.  This support person may not rotate.    Please refer to the [facility] website for the visitor guidelines for Inpatients (after your surgery is over and you are in a regular room).       Your procedure is scheduled on:  06/05/2023    Report to [facility] Main Entrance    Report to admitting at  1130 AM   Call this number if you have problems the morning of surgery [phone]   Do not eat food :After Midnight.   After Midnight you may have the following liquids until __ 1100____ AM DAY OF SURGERY  Water Non-Citrus Juices (without pulp, NO RED-Apple, White grape, White cranberry) Black Coffee (NO MILK/CREAM OR CREAMERS, sugar ok)  Clear Tea (NO MILK/CREAM OR CREAMERS, sugar ok) regular and decaf                             Plain Jell-O (NO RED)                                           Fruit ices (not with fruit pulp, NO RED)                                     Popsicles (NO RED)                                                               Sports drinks like Gatorade (NO RED)   The day of surgery:  Drink ONE (1) Pre-Surgery Clear Ensure or G2 at 1100 AM the morning of surgery. Drink in one sitting. Do not sip.  This drink was given to you during your  hospital  pre-op appointment visit. Nothing else to drink after completing the  Pre-Surgery Clear Ensure or G2.          If you have questions, please contact your surgeon's office.   Oral Hygiene is also important to reduce your risk of infection.                                    Remember - BRUSH YOUR TEETH THE MORNING OF SURGERY WITH YOUR REGULAR TOOTHPASTE  DENTURES WILL BE REMOVED PRIOR TO SURGERY  PLEASE DO NOT APPLY "Poly grip" OR ADHESIVES!!!   Do NOT smoke after Midnight   Stop all vitamins and herbal supplements 7 days before surgery.   Take these medicines the morning of surgery with A SIP OF WATER:  inhalers as usual and bring, nasal spray if needed, wellbutrin, coreg, cymbalta, protonix   DO NOT TAKE ANY ORAL DIABETIC MEDICATIONS DAY OF YOUR SURGERY  Bring CPAP mask and tubing day of surgery.                              You may not have any metal on your body including hair pins, jewelry, and body piercing             Do not wear make-up, lotions, powders, perfumes/cologne, or deodorant  Do not wear nail polish including gel and S&S, artificial/acrylic nails, or any other type of covering on natural nails including finger and toenails. If you have artificial nails, gel coating, etc. that needs to be removed by a nail salon please have this removed prior to surgery or surgery may need to be canceled/ delayed if the surgeon/ anesthesia feels like they are unable to be safely monitored.   Do not shave  48 hours prior to surgery.               Men may shave face and neck.   Do not bring valuables to the hospital. [facility] IS NOT             RESPONSIBLE   FOR VALUABLES.   Contacts, glasses, dentures or bridgework may not be worn into surgery.   Bring small overnight bag day of surgery.   DO NOT BRING YOUR HOME MEDICATIONS TO THE HOSPITAL. PHARMACY WILL DISPENSE MEDICATIONS LISTED ON YOUR MEDICATION LIST TO YOU DURING YOUR ADMISSION IN THE HOSPITAL!    Patients  discharged on the day of surgery will not be allowed to drive home.  Someone NEEDS to stay with you for the first 24 hours after anesthesia.   Special Instructions: Bring a copy of your healthcare power of attorney and living will documents the day of surgery if you haven't scanned them before.              Please read over the following fact sheets you were given: IF YOU HAVE QUESTIONS ABOUT YOUR PRE-OP INSTRUCTIONS PLEASE CALL [phone]   If you received a COVID test during your pre-op visit  it is requested that you wear a mask when out in public, stay away from anyone that may not be feeling well and notify your surgeon if you develop symptoms. If you test positive for Covid or have been in contact with anyone that has tested positive in the last 10 days please notify you surgeon. [place]- Preparing for Total Shoulder Arthroplasty    Before surgery, you can play an important role. Because skin is not sterile, your skin needs to be as free of germs as possible. You can reduce the number of germs on your skin by using the following products. Benzoyl Peroxide Gel Reduces the number of germs present on the skin Applied twice a day to shoulder area starting two days before surgery    ==================================================================  Please follow these instructions carefully:  BENZOYL PEROXIDE 5% GEL  Please do not use if you have an allergy to benzoyl peroxide.   If your skin becomes reddened/irritated stop using the benzoyl peroxide.  Starting two days before surgery, apply as follows: Apply benzoyl peroxide in the morning and at night. Apply after taking a shower. If you are not taking a shower clean entire shoulder front, back, and side along with the armpit with a clean wet washcloth.  Place a quarter-sized dollop on your shoulder and rub in thoroughly, making sure to cover the front, back, and side of your shoulder, along with the armpit.   2 days before ____  AM   ____ PM              1 day before ____ AM   ____ PM                         Do this twice a day for two days.  (Last application is the night before surgery, AFTER using the CHG soap as described below).  Do NOT apply benzoyl peroxide gel on the day of surgery.[place]- Preparing for Total Shoulder Arthroplasty    Before surgery, you can play an important role. Because skin is not sterile, your skin needs to be as free of germs as possible. You can reduce the number of germs on your skin by using the following products. Benzoyl Peroxide Gel Reduces the number of germs present on the skin Applied twice a day to shoulder area starting two days before surgery    ==================================================================  Please follow these instructions carefully:  BENZOYL PEROXIDE 5% GEL  Please do not use if you have an allergy to benzoyl peroxide.   If your skin becomes reddened/irritated stop using the benzoyl peroxide.  Starting two days before surgery, apply as follows: Apply benzoyl peroxide in the morning and at night. Apply after taking a shower. If you are not taking a shower clean entire shoulder front, back, and side along with the armpit with a clean wet washcloth.  Place a quarter-sized dollop on your shoulder and rub in thoroughly, making sure to cover the front, back, and side of your shoulder, along with the armpit.   2 days before ____ AM   ____ PM              1 day before ____ AM   ____ PM                         Do this twice a day for two days.  (Last application is the night before surgery, AFTER using the CHG soap as described below).  Do NOT apply benzoyl peroxide gel on the day of surgery.     Pre-operative 5 CHG Bath Instructions   You can play a key role in reducing the risk of infection after surgery. Your skin needs to be as free of germs as possible. You can reduce the number of germs on your skin by washing with CHG (chlorhexidine  gluconate) soap before surgery. CHG is an antiseptic soap that kills germs and continues to kill germs even after washing.   DO NOT use if you have an allergy to chlorhexidine/CHG or antibacterial soaps. If your skin becomes reddened or irritated, stop using the CHG and notify one of our RNs at [phone].   Please shower with the CHG soap starting 4 days before surgery using the following schedule:     Please keep in mind the following:  DO NOT shave, including legs and underarms, starting the day of your first shower.   You may shave your  face at any point before/day of surgery.  Place clean sheets on your bed the day you start using CHG soap. Use a clean washcloth (not used since being washed) for each shower. DO NOT sleep with pets once you start using the CHG.   CHG Shower Instructions:  If you choose to wash your hair and private area, wash first with your normal shampoo/soap.  After you use shampoo/soap, rinse your hair and body thoroughly to remove shampoo/soap residue.  Turn the water OFF and apply about 3 tablespoons (45 ml) of CHG soap to a CLEAN washcloth.  Apply CHG soap ONLY FROM YOUR NECK DOWN TO YOUR TOES (washing for 3-5 minutes)  DO NOT use CHG soap on face, private areas, open wounds, or sores.  Pay special attention to the area where your surgery is being performed.  If you are having back surgery, having someone wash your back for you may be helpful. Wait 2 minutes after CHG soap is applied, then you may rinse off the CHG soap.  Pat dry with a clean towel  Put on clean clothes/pajamas   If you choose to wear lotion, please use ONLY the CHG-compatible lotions on the back of this paper.     Additional instructions for the day of surgery: DO NOT APPLY any lotions, deodorants, cologne, or perfumes.   Put on clean/comfortable clothes.  Brush your teeth.  Ask your nurse before applying any prescription medications to the skin.   CHG Compatible Lotions   Aveeno  Moisturizing lotion  Cetaphil Moisturizing Cream  Cetaphil Moisturizing Lotion  Clairol Herbal Essence Moisturizing Lotion, Dry Skin  Clairol Herbal Essence Moisturizing Lotion, Extra Dry Skin  Clairol Herbal Essence Moisturizing Lotion, Normal Skin  Curel Age Defying Therapeutic Moisturizing Lotion with Alpha Hydroxy  Curel Extreme Care Body Lotion  Curel Soothing Hands Moisturizing Hand Lotion  Curel Therapeutic Moisturizing Cream, Fragrance-Free  Curel Therapeutic Moisturizing Lotion, Fragrance-Free  Curel Therapeutic Moisturizing Lotion, Original Formula  Eucerin Daily Replenishing Lotion  Eucerin Dry Skin Therapy Plus Alpha Hydroxy Crme  Eucerin Dry Skin Therapy Plus Alpha Hydroxy Lotion  Eucerin Original Crme  Eucerin Original Lotion  Eucerin Plus Crme Eucerin Plus Lotion  Eucerin TriLipid Replenishing Lotion  Keri Anti-Bacterial Hand Lotion  Keri Deep Conditioning Original Lotion Dry Skin Formula Softly Scented  Keri Deep Conditioning Original Lotion, Fragrance Free Sensitive Skin Formula  Keri Lotion Fast Absorbing Fragrance Free Sensitive Skin Formula  Keri Lotion Fast Absorbing Softly Scented Dry Skin Formula  Keri Original Lotion  Keri Skin Renewal Lotion Keri Silky Smooth Lotion  Keri Silky Smooth Sensitive Skin Lotion  Nivea Body Creamy Conditioning Oil  Nivea Body Extra Enriched Lotion  Nivea Body Original Lotion  Nivea Body Sheer Moisturizing Lotion Nivea Crme  Nivea Skin Firming Lotion  NutraDerm 30 Skin Lotion  NutraDerm Skin Lotion  NutraDerm Therapeutic Skin Cream  NutraDerm Therapeutic Skin Lotion  ProShield Protective Hand Cream  Provon moisturizing lotion  an reduce the number of germs on your skin by using the following products.

## 2023-05-19 ENCOUNTER — Other Ambulatory Visit: Payer: Self-pay | Admitting: Family Medicine

## 2023-05-21 NOTE — Telephone Encounter (Signed)
Form was faxed to [phone] Form placed to be scan in patient chart

## 2023-05-23 ENCOUNTER — Encounter: Payer: Self-pay | Admitting: Internal Medicine

## 2023-05-23 ENCOUNTER — Other Ambulatory Visit: Payer: Self-pay

## 2023-05-23 ENCOUNTER — Encounter (HOSPITAL_COMMUNITY): Payer: Self-pay | Admitting: *Deleted

## 2023-05-23 ENCOUNTER — Encounter (HOSPITAL_COMMUNITY)
Admission: RE | Admit: 2023-05-23 | Discharge: 2023-05-23 | Disposition: A | Discharge status: Home / Self Care | Payer: Medicare Other | Source: Ambulatory Visit | Attending: Orthopedic Surgery | Admitting: Orthopedic Surgery

## 2023-05-23 VITALS — BP 150/80 | HR 69 | Temp 98.6°F | Ht 62.0 in | Wt 131.0 lb

## 2023-05-23 DIAGNOSIS — Z95810 Presence of automatic (implantable) cardiac defibrillator: Secondary | ICD-10-CM | POA: Insufficient documentation

## 2023-05-23 DIAGNOSIS — Z7902 Long term (current) use of antithrombotics/antiplatelets: Secondary | ICD-10-CM | POA: Diagnosis not present

## 2023-05-23 DIAGNOSIS — M19012 Primary osteoarthritis, left shoulder: Secondary | ICD-10-CM | POA: Diagnosis not present

## 2023-05-23 DIAGNOSIS — Z01812 Encounter for preprocedural laboratory examination: Secondary | ICD-10-CM | POA: Insufficient documentation

## 2023-05-23 DIAGNOSIS — Z01818 Encounter for other preprocedural examination: Secondary | ICD-10-CM

## 2023-05-23 DIAGNOSIS — I10 Essential (primary) hypertension: Secondary | ICD-10-CM | POA: Insufficient documentation

## 2023-05-23 DIAGNOSIS — Z79899 Other long term (current) drug therapy: Secondary | ICD-10-CM | POA: Insufficient documentation

## 2023-05-23 DIAGNOSIS — Z8673 Personal history of transient ischemic attack (TIA), and cerebral infarction without residual deficits: Secondary | ICD-10-CM | POA: Insufficient documentation

## 2023-05-23 DIAGNOSIS — I428 Other cardiomyopathies: Secondary | ICD-10-CM | POA: Diagnosis not present

## 2023-05-23 DIAGNOSIS — F319 Bipolar disorder, unspecified: Secondary | ICD-10-CM | POA: Diagnosis not present

## 2023-05-23 LAB — SURGICAL PCR SCREEN
MRSA, PCR: POSITIVE — AB
Staphylococcus aureus: POSITIVE — AB

## 2023-05-23 LAB — BASIC METABOLIC PANEL
Anion gap: 8 (ref 5–15)
BUN: 20 mg/dL (ref 8–23)
CO2: 25 mmol/L (ref 22–32)
Calcium: 9.1 mg/dL (ref 8.9–10.3)
Chloride: 106 mmol/L (ref 98–111)
Creatinine, Ser: 0.76 mg/dL (ref 0.44–1.00)
GFR, Estimated: >60 mL/min (ref >60)
Glucose, Bld: 100 mg/dL — ABNORMAL HIGH (ref 70–99)
Potassium: 3.9 mmol/L (ref 3.5–5.1)
Sodium: 139 mmol/L (ref 135–145)

## 2023-05-23 LAB — CBC
HCT: 36.6 % (ref 36.0–46.0)
Hemoglobin: 11.8 g/dL — ABNORMAL LOW (ref 12.0–15.0)
MCH: 28.9 pg (ref 26.0–34.0)
MCHC: 32.2 g/dL (ref 30.0–36.0)
MCV: 89.7 fL (ref 80.0–100.0)
Platelets: 324 10*3/uL (ref 150–400)
RBC: 4.08 MIL/uL (ref 3.87–5.11)
RDW: 14.5 % (ref 11.5–15.5)
WBC: 8 10*3/uL (ref 4.0–10.5)
nRBC: 0 % (ref 0.0–0.2)

## 2023-05-23 NOTE — Progress Notes (Addendum)
Pacemaker orders requested.Christiane Ha from [employer] was notified,he got all the information about the surgery. Plavix will be hold after: 05/28/23

## 2023-05-23 NOTE — Progress Notes (Signed)
PCR : + MRSA

## 2023-05-23 NOTE — Progress Notes (Signed)
PERIOPERATIVE PRESCRIPTION FOR IMPLANTED CARDIAC DEVICE PROGRAMMING  Patient Information: Name:  Anita Diaz  DOB:  26-Sep-1955  MRN:  [id number]    Planned Procedure:  REVERSE SHOULDER ARTHROPLASTY - Left  Surgeon:  Dr. Teryl Lucy  Date of Procedure:  06/05/23  Cautery will be used.  Position during surgery:    Please send documentation back to:  [facility] (Fax # [phone])  Device Information:  Clinic EP Physician:  Lewayne Bunting, MD   Device Type:  Defibrillator Manufacturer and Phone #:  [employer]: [phone] Pacemaker Dependent?:  No. Date of Last Device Check:  03/22/2023 Normal Device Function?:  Yes.    Electrophysiologist's Recommendations:  Have magnet available. Provide continuous ECG monitoring when magnet is used or reprogramming is to be performed.  Procedure will likely interfere with device function.  Device should be programmed:  Tachy therapies disabled  Per Device Clinic Standing Orders, Lenor Coffin, RN  6:49 PM 05/23/2023

## 2023-05-24 ENCOUNTER — Encounter (HOSPITAL_COMMUNITY): Payer: Self-pay
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
## 2023-05-24 NOTE — Anesthesia Preprocedure Evaluation (Addendum)
Anesthesia Evaluation  Patient identified by MRN, date of birth, ID band  Reviewed: Allergy & Precautions, NPO status , Patient's Chart, lab work & pertinent test results, reviewed documented beta blocker date and time   Airway Mallampati: II  TM Distance: >3 FB Neck ROM: Full    Dental   Pulmonary former smoker 74 pack year history, quit smoking 2010   breath sounds clear to auscultation       Cardiovascular hypertension, Pt. on medications and Pt. on home beta blockers +CHF (LVEF 45-50%)  + dysrhythmias + pacemaker + Cardiac Defibrillator  Rhythm:Regular Rate:Normal  Echo 2022  1. Left ventricular ejection fraction, by estimation, is 45 to 50%. The  left ventricle has mildly decreased function. The left ventricle  demonstrates regional wall motion abnormalities (see scoring  diagram/findings for description). Left ventricular  diastolic parameters are indeterminate. There is hypokinesis of the left  ventricular, basal inferior wall.   2. Right ventricular systolic function is normal. The right ventricular  size is normal. There is normal pulmonary artery systolic pressure. The  estimated right ventricular systolic pressure is 26.6 mmHg.   3. The mitral valve is normal in structure. No evidence of mitral valve  regurgitation. No evidence of mitral stenosis.   4. Tricuspid valve regurgitation is mild to moderate.   5. The aortic valve is grossly normal. There is mild calcification of the  aortic valve. Aortic valve regurgitation is trivial.   6. The inferior vena cava is normal in size with greater than 50%  respiratory variability, suggesting right atrial pressure of 3 mmHg.    NICM s/p ICD placement (2019)    Neuro/Psych  Headaches PSYCHIATRIC DISORDERS  Depression Bipolar Disorder   cryptogenic CVA in 2022. She followed up with Neurology for ongoing headaches. They recommended sleep eval for suspected OSA but she declined.  She was recommended to remain on ASA and Plavix. Recently her PCP obtained MRI of her brain for ongoing headaches which showed partially empty sella. She was referred back to Neurology but has not seen yet.    CVA, No Residual Symptoms    GI/Hepatic Neg liver ROS,GERD  Medicated and Controlled,,  Endo/Other  negative endocrine ROS    Renal/GU negative Renal ROS  negative genitourinary   Musculoskeletal  (+) Arthritis , Osteoarthritis,    Abdominal   Peds  Hematology negative hematology ROS (+) Hb 11.8   Anesthesia Other Findings   Reproductive/Obstetrics negative OB ROS                              Anesthesia Physical Anesthesia Plan  ASA: 3  Anesthesia Plan: General   Post-op Pain Management: Tylenol PO (pre-op)* and Regional block*   Induction: Intravenous  PONV Risk Score and Plan: 3 and Ondansetron, Dexamethasone, Midazolam and Treatment may vary due to age or medical condition  Airway Management Planned: Oral ETT  Additional Equipment: None  Intra-op Plan:   Post-operative Plan: Extubation in OR  Informed Consent: I have reviewed the patients History and Physical, chart, labs and discussed the procedure including the risks, benefits and alternatives for the proposed anesthesia with the patient or authorized representative who has indicated his/her understanding and acceptance.     Dental advisory given  Plan Discussed with: CRNA  Anesthesia Plan Comments: (Device to be reprogrammed by rep preop and posotp )         Anesthesia Quick Evaluation

## 2023-05-27 ENCOUNTER — Other Ambulatory Visit: Payer: Self-pay | Admitting: Family Medicine

## 2023-06-04 NOTE — Progress Notes (Addendum)
 Paged [employer] rep due to surgery time change for tomorrow.  Patient now to arrive at 0745 for 1015 surgery.   Darlene from [employer] returned call and is aware of patients new surgery time and plans to arrive before 1015

## 2023-06-05 ENCOUNTER — Encounter (HOSPITAL_COMMUNITY)
Admission: RE | Disposition: A | Discharge status: Home / Self Care | Payer: Self-pay | Source: Home / Self Care | Attending: Orthopedic Surgery

## 2023-06-05 ENCOUNTER — Ambulatory Visit (HOSPITAL_COMMUNITY): Payer: Self-pay | Admitting: Anesthesiology

## 2023-06-05 ENCOUNTER — Ambulatory Visit (HOSPITAL_COMMUNITY): Payer: Medicare Other | Admitting: Medical

## 2023-06-05 ENCOUNTER — Encounter (HOSPITAL_COMMUNITY): Payer: Self-pay | Admitting: Orthopedic Surgery

## 2023-06-05 ENCOUNTER — Ambulatory Visit (HOSPITAL_COMMUNITY): Payer: Medicare Other

## 2023-06-05 ENCOUNTER — Other Ambulatory Visit: Payer: Self-pay

## 2023-06-05 ENCOUNTER — Ambulatory Visit (HOSPITAL_COMMUNITY)
Admission: RE | Admit: 2023-06-05 | Discharge: 2023-06-05 | Disposition: A | Discharge status: Home / Self Care | Payer: Medicare Other | Attending: Orthopedic Surgery | Admitting: Orthopedic Surgery

## 2023-06-05 DIAGNOSIS — Z8673 Personal history of transient ischemic attack (TIA), and cerebral infarction without residual deficits: Secondary | ICD-10-CM | POA: Diagnosis not present

## 2023-06-05 DIAGNOSIS — F319 Bipolar disorder, unspecified: Secondary | ICD-10-CM | POA: Insufficient documentation

## 2023-06-05 DIAGNOSIS — Z8249 Family history of ischemic heart disease and other diseases of the circulatory system: Secondary | ICD-10-CM | POA: Insufficient documentation

## 2023-06-05 DIAGNOSIS — M75102 Unspecified rotator cuff tear or rupture of left shoulder, not specified as traumatic: Secondary | ICD-10-CM

## 2023-06-05 DIAGNOSIS — M19012 Primary osteoarthritis, left shoulder: Secondary | ICD-10-CM | POA: Insufficient documentation

## 2023-06-05 DIAGNOSIS — I110 Hypertensive heart disease with heart failure: Secondary | ICD-10-CM | POA: Insufficient documentation

## 2023-06-05 DIAGNOSIS — Z87891 Personal history of nicotine dependence: Secondary | ICD-10-CM | POA: Diagnosis not present

## 2023-06-05 DIAGNOSIS — G8918 Other acute postprocedural pain: Secondary | ICD-10-CM | POA: Diagnosis not present

## 2023-06-05 DIAGNOSIS — I509 Heart failure, unspecified: Secondary | ICD-10-CM | POA: Insufficient documentation

## 2023-06-05 DIAGNOSIS — Z96612 Presence of left artificial shoulder joint: Secondary | ICD-10-CM | POA: Diagnosis not present

## 2023-06-05 DIAGNOSIS — K219 Gastro-esophageal reflux disease without esophagitis: Secondary | ICD-10-CM | POA: Diagnosis not present

## 2023-06-05 DIAGNOSIS — Z95810 Presence of automatic (implantable) cardiac defibrillator: Secondary | ICD-10-CM | POA: Insufficient documentation

## 2023-06-05 DIAGNOSIS — I071 Rheumatic tricuspid insufficiency: Secondary | ICD-10-CM | POA: Diagnosis not present

## 2023-06-05 DIAGNOSIS — Z471 Aftercare following joint replacement surgery: Secondary | ICD-10-CM | POA: Diagnosis not present

## 2023-06-05 DIAGNOSIS — I5022 Chronic systolic (congestive) heart failure: Secondary | ICD-10-CM | POA: Diagnosis not present

## 2023-06-05 HISTORY — PX: REVERSE SHOULDER ARTHROPLASTY: SHX5054

## 2023-06-05 HISTORY — DX: Unspecified rotator cuff tear or rupture of left shoulder, not specified as traumatic: M75.102

## 2023-06-05 SURGERY — ARTHROPLASTY, SHOULDER, TOTAL, REVERSE
Anesthesia: Regional | Site: Shoulder | Laterality: Left

## 2023-06-05 MED ORDER — DEXAMETHASONE SODIUM PHOSPHATE 10 MG/ML IJ SOLN
INTRAMUSCULAR | Status: DC | PRN
  Administered 2023-06-05: 5 mg via INTRAVENOUS

## 2023-06-05 MED ORDER — OXYCODONE HCL 5 MG/5ML PO SOLN: 5.0000 mg | Freq: Once | ORAL | Status: DC | PRN

## 2023-06-05 MED ORDER — BUPIVACAINE LIPOSOME 1.3 % IJ SUSP
INTRAMUSCULAR | Status: DC | PRN
  Administered 2023-06-05: 10 mL via PERINEURAL

## 2023-06-05 MED ORDER — SENNA-DOCUSATE SODIUM 8.6-50 MG PO TABS: 2.0000 | ORAL_TABLET | Freq: Every day | ORAL | 1 refills | Status: DC

## 2023-06-05 MED ORDER — ONDANSETRON HCL 4 MG PO TABS: 4.0000 mg | ORAL_TABLET | Freq: Three times a day (TID) | ORAL | 0 refills | Status: DC | PRN

## 2023-06-05 MED ORDER — FENTANYL CITRATE (PF) 100 MCG/2ML IJ SOLN
INTRAMUSCULAR | Status: AC
  Filled 2023-06-05: qty 2

## 2023-06-05 MED ORDER — ONDANSETRON HCL 4 MG/2ML IJ SOLN
INTRAMUSCULAR | Status: DC | PRN
  Administered 2023-06-05: 4 mg via INTRAVENOUS

## 2023-06-05 MED ORDER — VANCOMYCIN HCL 1000 MG IV SOLR
INTRAVENOUS | Status: DC | PRN
  Administered 2023-06-05: 1000 mg via TOPICAL

## 2023-06-05 MED ORDER — PROPOFOL 10 MG/ML IV BOLUS
INTRAVENOUS | Status: DC | PRN
  Administered 2023-06-05: 110 mg via INTRAVENOUS

## 2023-06-05 MED ORDER — OXYCODONE HCL 5 MG PO TABS: 5.0000 mg | ORAL_TABLET | ORAL | 0 refills | Status: DC | PRN

## 2023-06-05 MED ORDER — LACTATED RINGERS IV BOLUS
250.0000 mL | Freq: Once | INTRAVENOUS | Status: AC
  Administered 2023-06-05: 250 mL via INTRAVENOUS

## 2023-06-05 MED ORDER — STERILE WATER FOR IRRIGATION IR SOLN
Status: DC | PRN
  Administered 2023-06-05: 1000 mL

## 2023-06-05 MED ORDER — BUPIVACAINE HCL 0.25 % IJ SOLN: INTRAMUSCULAR | Status: DC | PRN

## 2023-06-05 MED ORDER — CHLORHEXIDINE GLUCONATE 4 % EX SOLN: 1.0000 | CUTANEOUS | 1 refills | Status: DC

## 2023-06-05 MED ORDER — BACLOFEN 10 MG PO TABS: 10.0000 mg | ORAL_TABLET | Freq: Three times a day (TID) | ORAL | 0 refills | Status: DC

## 2023-06-05 MED ORDER — BUPIVACAINE HCL (PF) 0.25 % IJ SOLN
INTRAMUSCULAR | Status: AC
  Filled 2023-06-05: qty 30

## 2023-06-05 MED ORDER — PHENYLEPHRINE HCL (PRESSORS) 10 MG/ML IV SOLN
INTRAVENOUS | Status: DC | PRN
  Administered 2023-06-05 (×3): 80 ug via INTRAVENOUS

## 2023-06-05 MED ORDER — BUPIVACAINE-EPINEPHRINE (PF) 0.5% -1:200000 IJ SOLN
INTRAMUSCULAR | Status: DC | PRN
  Administered 2023-06-05: 12 mL via PERINEURAL

## 2023-06-05 MED ORDER — MUPIROCIN 2 % EX OINT: 1.0000 | TOPICAL_OINTMENT | Freq: Two times a day (BID) | CUTANEOUS | 0 refills | Status: AC

## 2023-06-05 MED ORDER — ALBUMIN HUMAN 5 % IV SOLN
12.5000 g | Freq: Once | INTRAVENOUS | Status: AC
  Administered 2023-06-05: 12.5 g via INTRAVENOUS

## 2023-06-05 MED ORDER — CEFAZOLIN SODIUM-DEXTROSE 2-4 GM/100ML-% IV SOLN
2.0000 g | INTRAVENOUS | Status: AC
  Administered 2023-06-05: 2 g via INTRAVENOUS
  Filled 2023-06-05: qty 100

## 2023-06-05 MED ORDER — HYDROMORPHONE HCL 1 MG/ML IJ SOLN: 0.2500 mg | INTRAMUSCULAR | Status: DC | PRN

## 2023-06-05 MED ORDER — FENTANYL CITRATE PF 50 MCG/ML IJ SOSY
50.0000 ug | PREFILLED_SYRINGE | INTRAMUSCULAR | Status: DC
  Administered 2023-06-05: 50 ug via INTRAVENOUS
  Filled 2023-06-05: qty 2

## 2023-06-05 MED ORDER — ALBUMIN HUMAN 5 % IV SOLN
INTRAVENOUS | Status: AC
  Filled 2023-06-05: qty 250

## 2023-06-05 MED ORDER — CHLORHEXIDINE GLUCONATE 0.12 % MT SOLN
15.0000 mL | Freq: Once | OROMUCOSAL | Status: AC
  Administered 2023-06-05: 15 mL via OROMUCOSAL

## 2023-06-05 MED ORDER — ROCURONIUM BROMIDE 100 MG/10ML IV SOLN
INTRAVENOUS | Status: DC | PRN
  Administered 2023-06-05: 20 mg via INTRAVENOUS
  Administered 2023-06-05: 40 mg via INTRAVENOUS

## 2023-06-05 MED ORDER — VANCOMYCIN HCL 1000 MG IV SOLR
INTRAVENOUS | Status: AC
  Filled 2023-06-05: qty 20

## 2023-06-05 MED ORDER — VANCOMYCIN HCL IN DEXTROSE 1-5 GM/200ML-% IV SOLN: 1000.0000 mg | Freq: Once | INTRAVENOUS | Status: DC

## 2023-06-05 MED ORDER — AMISULPRIDE (ANTIEMETIC) 5 MG/2ML IV SOLN: 10.0000 mg | Freq: Once | INTRAVENOUS | Status: DC | PRN

## 2023-06-05 MED ORDER — ACETAMINOPHEN 500 MG PO TABS: 1000.0000 mg | ORAL_TABLET | Freq: Once | ORAL | Status: DC

## 2023-06-05 MED ORDER — VANCOMYCIN HCL IN DEXTROSE 1-5 GM/200ML-% IV SOLN
1000.0000 mg | Freq: Once | INTRAVENOUS | Status: AC
  Administered 2023-06-05: 1000 mg via INTRAVENOUS
  Filled 2023-06-05: qty 200

## 2023-06-05 MED ORDER — PROPOFOL 10 MG/ML IV BOLUS
INTRAVENOUS | Status: AC
  Filled 2023-06-05: qty 20

## 2023-06-05 MED ORDER — SUGAMMADEX SODIUM 200 MG/2ML IV SOLN
INTRAVENOUS | Status: DC | PRN
  Administered 2023-06-05: 118.8 mg via INTRAVENOUS

## 2023-06-05 MED ORDER — ONDANSETRON HCL 4 MG/2ML IJ SOLN: 4.0000 mg | Freq: Once | INTRAMUSCULAR | Status: DC | PRN

## 2023-06-05 MED ORDER — PHENYLEPHRINE HCL-NACL 20-0.9 MG/250ML-% IV SOLN
INTRAVENOUS | Status: DC | PRN
  Administered 2023-06-05: 50 ug/min via INTRAVENOUS

## 2023-06-05 MED ORDER — OXYCODONE HCL 5 MG PO TABS: 5.0000 mg | ORAL_TABLET | Freq: Once | ORAL | Status: DC | PRN

## 2023-06-05 MED ORDER — POVIDONE-IODINE 10 % EX SWAB: 2.0000 | Freq: Once | CUTANEOUS | Status: DC

## 2023-06-05 MED ORDER — ORAL CARE MOUTH RINSE: 15.0000 mL | Freq: Once | OROMUCOSAL | Status: AC

## 2023-06-05 MED ORDER — MIDAZOLAM HCL 2 MG/2ML IJ SOLN
1.0000 mg | INTRAMUSCULAR | Status: DC
Start: 2023-06-05 — End: 2023-06-05
  Administered 2023-06-05: 0.5 mg via INTRAVENOUS
  Filled 2023-06-05: qty 2

## 2023-06-05 MED ORDER — LACTATED RINGERS IV SOLN: INTRAVENOUS | Status: DC

## 2023-06-05 MED ORDER — FENTANYL CITRATE (PF) 100 MCG/2ML IJ SOLN
INTRAMUSCULAR | Status: DC | PRN
  Administered 2023-06-05: 100 ug via INTRAVENOUS

## 2023-06-05 MED ORDER — ACETAMINOPHEN 500 MG PO TABS
1000.0000 mg | ORAL_TABLET | Freq: Once | ORAL | Status: AC
  Administered 2023-06-05: 1000 mg via ORAL
  Filled 2023-06-05: qty 2

## 2023-06-05 MED ORDER — 0.9 % SODIUM CHLORIDE (POUR BTL) OPTIME
TOPICAL | Status: DC | PRN
Start: 2023-06-05 — End: 2023-06-05
  Administered 2023-06-05: 1000 mL

## 2023-06-05 MED ORDER — LACTATED RINGERS IV BOLUS
500.0000 mL | Freq: Once | INTRAVENOUS | Status: AC
  Administered 2023-06-05: 500 mL via INTRAVENOUS

## 2023-06-05 SURGICAL SUPPLY — 54 items
BAG COUNTER SPONGE SURGICOUNT (BAG) IMPLANT
BAG ZIPLOCK 12X15 (MISCELLANEOUS) ×2 IMPLANT
BASEPLATE GLENOSPHERE 25 (Plate) IMPLANT
BEARING HUMERAL SHLDER 36M STD (Shoulder) IMPLANT
BIT DRILL TWIST 2.7 (BIT) IMPLANT
BLADE SAW SGTL 73X25 THK (BLADE) ×2 IMPLANT
CEMENT BONE DEPUY (Cement) IMPLANT
CLSR STERI-STRIP ANTIMIC 1/2X4 (GAUZE/BANDAGES/DRESSINGS) ×2 IMPLANT
COOLER ICEMAN CLASSIC (MISCELLANEOUS) IMPLANT
COVER BACK TABLE 60X90IN (DRAPES) ×2 IMPLANT
COVER SURGICAL LIGHT HANDLE (MISCELLANEOUS) ×2 IMPLANT
DRAPE SHEET LG 3/4 BI-LAMINATE (DRAPES) ×4 IMPLANT
DRAPE SURG 17X11 SM STRL (DRAPES) ×2 IMPLANT
DRAPE SURG ORHT 6 SPLT 77X108 (DRAPES) ×4 IMPLANT
DRAPE TOP 10253 STERILE (DRAPES) ×2 IMPLANT
DRAPE U-SHAPE 47X51 STRL (DRAPES) ×2 IMPLANT
DRSG MEPILEX POST OP 4X8 (GAUZE/BANDAGES/DRESSINGS) ×2 IMPLANT
DURAPREP 26ML APPLICATOR (WOUND CARE) ×4 IMPLANT
ELECT REM PT RETURN 15FT ADLT (MISCELLANEOUS) ×2 IMPLANT
FACESHIELD WRAPAROUND (MASK) ×1
FACESHIELD WRAPAROUND OR TEAM (MASK) ×2 IMPLANT
GLENOID SPHERE STD STRL 36MM (Orthopedic Implant) IMPLANT
GLOVE BIO SURGEON STRL SZ 6.5 (GLOVE) ×2 IMPLANT
GLOVE BIOGEL PI IND STRL 7.0 (GLOVE) ×2 IMPLANT
GLOVE BIOGEL PI IND STRL 8 (GLOVE) ×2 IMPLANT
GLOVE ORTHO TXT STRL SZ7.5 (GLOVE) ×2 IMPLANT
GOWN STRL SURGICAL XL XLNG (GOWN DISPOSABLE) ×4 IMPLANT
HOOD PEEL AWAY T7 (MISCELLANEOUS) ×4 IMPLANT
KIT BASIN OR (CUSTOM PROCEDURE TRAY) ×2 IMPLANT
KIT TURNOVER KIT A (KITS) IMPLANT
PACK SHOULDER (CUSTOM PROCEDURE TRAY) ×2 IMPLANT
PIN STEINMANN THREADED TIP (PIN) IMPLANT
PIN THREADED REVERSE (PIN) IMPLANT
RESTRAINT HEAD UNIVERSAL NS (MISCELLANEOUS) ×2 IMPLANT
SCREW BONE LOCKING 4.75X30X3.5 (Screw) IMPLANT
SCREW BONE STRL 6.5MMX25MM (Screw) IMPLANT
SCREW LOCKING NS 4.75MMX20MM (Screw) IMPLANT
SHOULDER HUMERAL BEAR 36M STD (Shoulder) ×1 IMPLANT
SLING ARM IMMOBILIZER LRG (SOFTGOODS) ×2 IMPLANT
SMARTMIX MINI TOWER (MISCELLANEOUS)
SPIKE FLUID TRANSFER (MISCELLANEOUS) IMPLANT
SPONGE T-LAP 18X18 ~~LOC~~+RFID (SPONGE) IMPLANT
SPONGE T-LAP 4X18 ~~LOC~~+RFID (SPONGE) IMPLANT
STEM HUMERAL STRL 9MX55MM (Stem) IMPLANT
SUCTION TUBE FRAZIER 12FR DISP (SUCTIONS) ×2 IMPLANT
SUPPORT WRAP ARM LG (MISCELLANEOUS) ×2 IMPLANT
SUT MAXBRAID #2 CVD NDL (SUTURE) IMPLANT
SUT MAXBRAID #5 CCS-NDL 2PK (SUTURE) IMPLANT
SUT MNCRL AB 3-0 PS2 18 (SUTURE) IMPLANT
SUT VIC AB 1 CT1 36 (SUTURE) ×2 IMPLANT
SUT VIC AB 2-0 CT1 TAPERPNT 27 (SUTURE) ×2 IMPLANT
TOWEL OR 17X26 10 PK STRL BLUE (TOWEL DISPOSABLE) ×2 IMPLANT
TOWER SMARTMIX MINI (MISCELLANEOUS) IMPLANT
TRAY HUM MINI SHOULDER +3 40 (Joint) IMPLANT

## 2023-06-05 NOTE — Evaluation (Signed)
Occupational Therapy Evaluation Patient Details Name: Anita Diaz MRN: [id number] DOB: [birth year] Today's Date: 06/05/2023   History of Present Illness   History of Present Illness: Patient is a 68 year old female who presented with left shoulder rotator cuff arthropathy. Patient underwent left reverse total shoulder arthroplasty on 2/11.  PMH: bipolar 1, depression, osteoarthritis, pacemaker, TIA.     Clinical Impressions PTA pt lives at home with daughter.  Education completed regarding compensatory strategies for ADL tasks and functional mobility, management of sling, no shoulder ROM per specified parameters in the order set as indicated below, positioning of operative arm in sitting and supine and edema control, including use of "Iceman" Cold Therapy machine. Caregiver present for education, written handouts provided and reviewed using Teach Back and pt/caregiver verbalized/demonstrated understanding. Due to the below listed deficits, pt requires mod A assistance with ADL tasks and supervision assist with functional mobility. Caregiver will be able to provide necessary level of assistance at discharge. Pt to follow up with MD to progress rehab of the operative shoulder.      If plan is discharge home, recommend the following:   A little help with bathing/dressing/bathroom;Assistance with cooking/housework;Direct supervision/assist for medications management;Assist for transportation;Direct supervision/assist for financial management;Help with stairs or ramp for entrance     Functional Status Assessment   Patient has had a recent decline in their functional status and demonstrates the ability to make significant improvements in function in a reasonable and predictable amount of time.     Equipment Recommendations   None recommended by OT      Precautions/Restrictions   Precautions Precautions: Shoulder Type of Shoulder Precautions: no ROM shoulder, OK for hand wrist and  elbow ROM, Shoulder Interventions: Shoulder sling/immobilizer;At all times;Off for dressing/bathing/exercises Precaution Booklet Issued: Yes (comment) (handouts) Recall of Precautions/Restrictions: Intact Precaution/Restrictions Comments: daughter present as well. Restrictions Weight Bearing Restrictions Per Provider Order: Yes LUE Weight Bearing Per Provider Order: Non weight bearing            ADL either performed or assessed with clinical judgement   ADL          Per orders, shoulder parameters as follows for ADL tasks: While moving within specified parameters, pt/caregiver instructed on bathing and how to donn/doff shirt, placing operative arm through sleeve first when donning and off last when doffing.Pt/caregiver educated on compensatory strategies for LB ADL and strategies to reduce risk of falls.  Pt/caregiver educated on donning/doffing sling and to wear the sling at all times with the exception of ADL, and to loosen the neck strap of the sling when the operative arm is in a supported position when sitting. In sitting or supine, pt instructed to have a pillow behind and under their operative arm to provide support. If assist needed with ambulation, caregiver educated on the importance of walking on pt's non-operative side.  Education regarding use of "IceMan" Cold Therapy completed, including the importance of using a barrier on the shoulder prior to positioning the wrap-on pad. Pt/caregiver verbalized/demonstrated understanding. Teach Back used while caregiver assisted with dressing pt and positioning "wrap-on pad" to facilitate DC.                                       Pertinent Vitals/Pain Pain Assessment Pain Assessment: No/denies pain (block still in place)     Extremity/Trunk Assessment Upper Extremity Assessment Upper Extremity Assessment: LUE deficits/detail LUE Deficits /  Details: block still in place, able to minimally move digits.               Cognition Arousal: Alert Behavior During Therapy: WFL for tasks assessed/performed Cognition: No apparent impairments             OT - Cognition Comments: daughter was present during session as well.                               Shoulder Instructions Shoulder Instructions Donning/doffing shirt without moving shoulder: Caregiver independent with task Method for sponge bathing under operated UE: Caregiver independent with task Donning/doffing sling/immobilizer: Caregiver independent with task Correct positioning of sling/immobilizer: Caregiver independent with task ROM for elbow, wrist and digits of operated UE: Caregiver independent with task Sling wearing schedule (on at all times/off for ADL's): Caregiver independent with task Proper positioning of operated UE when showering: Caregiver independent with task Positioning of UE while sleeping: Caregiver independent with task    Home Living Family/patient expects to be discharged to:: Private residence Living Arrangements: Children Available Help at Discharge: Available 24 hours/day;Family Type of Home: House Home Access: Level entry     Home Layout: Able to live on main level with bedroom/bathroom                          Prior Functioning/Environment Prior Level of Function : Independent/Modified Independent                    OT Problem List: Decreased range of motion;Impaired UE functional use;Decreased knowledge of precautions   OT Treatment/Interventions:        OT Goals(Current goals can be found in the care plan section)   Acute Rehab OT Goals OT Goal Formulation: All assessment and education complete, DC therapy   OT Frequency:          AM-PAC OT "6 Clicks" Daily Activity     Outcome Measure Help from another person eating meals?: None Help from another person taking care of personal grooming?: None Help from another person toileting, which includes using toliet, bedpan,  or urinal?: None Help from another person bathing (including washing, rinsing, drying)?: A Little Help from another person to put on and taking off regular upper body clothing?: A Lot Help from another person to put on and taking off regular lower body clothing?: A Little 6 Click Score: 20   End of Session Equipment Utilized During Treatment: Other (comment) (sling) Nurse Communication: Other (comment) (ok to from OT perspective to d/c.)  Activity Tolerance: Patient tolerated treatment well Patient left: in chair;with call bell/phone within reach (in PACU)  OT Visit Diagnosis: Unsteadiness on feet (R26.81)                Time: 1440-1459 OT Time Calculation (min): 19 min Charges:  OT General Charges $OT Visit: 1 Visit OT Evaluation $OT Eval Low Complexity: 1 Low  Zaul Hubers OTR/L, MS Acute Rehabilitation Department Office# [phone]   Selinda Flavin 06/05/2023, 3:11 PM
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
## 2023-06-05 NOTE — Interval H&P Note (Signed)
History and Physical Interval Note:  06/05/2023 9:38 AM  Anita Diaz  has presented today for surgery, with the diagnosis of left shoulder OA.  The various methods of treatment have been discussed with the patient and family. After consideration of risks, benefits and other options for treatment, the patient has consented to  Procedure(s): REVERSE SHOULDER ARTHROPLASTY (Left) as a surgical intervention.  The patient's history has been reviewed, patient examined, no change in status, stable for surgery.  I have reviewed the patient's chart and labs.  Questions were answered to the patient's satisfaction.     Eulas Post

## 2023-06-05 NOTE — Anesthesia Procedure Notes (Signed)
Procedure Name: Intubation Date/Time: 06/05/2023 10:31 AM  Performed by: Loleta Helmer Dull, CRNAPre-anesthesia Checklist: Patient identified, Patient being monitored, Timeout performed, Emergency Drugs available and Suction available Patient Re-evaluated:Patient Re-evaluated prior to induction Oxygen Delivery Method: Circle system utilized Preoxygenation: Pre-oxygenation with 100% oxygen Induction Type: IV induction Ventilation: Mask ventilation without difficulty Laryngoscope Size: Miller and 2 Grade View: Grade I Tube type: Oral Tube size: 7.0 mm Number of attempts: 1 Airway Equipment and Method: Stylet Placement Confirmation: ETT inserted through vocal cords under direct vision, positive ETCO2 and breath sounds checked- equal and bilateral Secured at: 20 cm Tube secured with: Tape Dental Injury: Teeth and Oropharynx as per pre-operative assessment

## 2023-06-05 NOTE — Discharge Instructions (Signed)
Diet: As you were doing prior to hospitalization   Shower:  May shower but keep the wounds dry, use an occlusive plastic wrap, NO SOAKING IN TUB.  If the bandage gets wet, change with a clean dry gauze.  If you have a splint on, leave the splint in place and keep the splint dry with a plastic bag.  Dressing:  You may change your dressing 3-5 days after surgery, unless you have a splint.  If you have a splint, then just leave the splint in place and we will change your bandages during your first follow-up appointment.    If you had hand or foot surgery, we will plan to remove your stitches in about 2 weeks in the office.  For all other surgeries, there are sticky tapes (steri-strips) on your wounds and all the stitches are absorbable.  Leave the steri-strips in place when changing your dressings, they will peel off with time, usually 2-3 weeks.  Activity:  Increase activity slowly as tolerated, but follow the weight bearing instructions below.  The rules on driving is that you can not be taking narcotics while you drive, and you must feel in control of the vehicle.    Weight Bearing:   sling at all times except hygiene.    To prevent constipation: you may use a stool softener such as -  Colace (over the counter) 100 mg by mouth twice a day  Drink plenty of fluids (prune juice may be helpful) and high fiber foods Miralax (over the counter) for constipation as needed.    Itching:  If you experience itching with your medications, try taking only a single pain pill, or even half a pain pill at a time.  You may take up to 10 pain pills per day, and you can also use benadryl over the counter for itching or also to help with sleep.   Precautions:  If you experience chest pain or shortness of breath - call 911 immediately for transfer to the hospital emergency department!!  If you develop a fever greater that 101 F, purulent drainage from wound, increased redness or drainage from wound, or calf pain --  Call the office at [phone]                                                Follow- Up Appointment:  Please call for an appointment to be seen in 2 weeks [place] - [phone]

## 2023-06-05 NOTE — Op Note (Signed)
06/05/2023  11:58 AM  PATIENT:  Anita Diaz    PRE-OPERATIVE DIAGNOSIS: Left shoulder rotator cuff arthropathy  POST-OPERATIVE DIAGNOSIS:  Same  PROCEDURE: LEFT reverse Total Shoulder Arthroplasty  SURGEON:  Eulas Post, MD  PHYSICIAN ASSISTANT: Dan Humphreys, PA-C, present and scrubbed throughout the case, critical for completion in a timely fashion, and for retraction, instrumentation, and closure.  Second Assistant: Darron Doom, RNFA  ANESTHESIA:   General with interscalene block using Exparel  ESTIMATED BLOOD LOSS: 150 mL  UNIQUE ASPECTS OF THE CASE: Bone quality was relatively poor.  The biceps was extremely poor quality, and the supraspinatus was not present, the subscapularis was able to be repaired.  I did not medialized significantly on the baseplate, just enough to take away the witness marks, and I had 100% contact, and only used 2 screws.  The central screw had excellent purchase.  I initially thought I was going to be a little bit tight with the reduction, although the final implant seated nicely and had appropriate tightness with the reduction.  PREOPERATIVE INDICATIONS:  Anita Diaz is a  68 y.o. female with a diagnosis of left shoulder OA who failed conservative measures and elected for surgical management.    The risks benefits and alternatives were discussed with the patient preoperatively including but not limited to the risks of infection, bleeding, nerve injury, cardiopulmonary complications, the need for revision surgery, dislocation, brachial plexus palsy, incomplete relief of pain, among others, and the patient was willing to proceed.  OPERATIVE IMPLANTS:   Implant Name Type Inv. Item Serial No. Manufacturer Lot No. LRB No. Used Action  BASEPLATE GLENOSPHERE 25 - [id number] Plate BASEPLATE GLENOSPHERE 25  [employer](ORTH,TRAU,BIO,SG) [id number] Left 1 Implanted  SCREW BONE STRL 6.7WGN56OZ - [id number] Screw SCREW BONE STRL 6.5MMX25MM  ZIMMER  RECON(ORTH,TRAU,BIO,SG) [id number] Left 1 Implanted  GLENOID SPHERE STD STRL - [id number] Orthopedic Implant GLENOID SPHERE STD STRL  [employer]ON(ORTH,TRAU,BIO,SG) [id number] Left 1 Implanted  SCREW LOCKING NS 4.75MMX20MM - [id number] Screw SCREW LOCKING NS 4.75MMX20MM  [employer]MER [employer]ON(ORTH,TRAU,BIO,SG) [id number] Left 1 Implanted  SCREW BONE LOCKING 5.18A41Y6.5 - [id number] Screw SCREW BONE LOCKING 4.75X30X3.5  [employer]IMMER [employer]ON(ORTH,TRAU,BIO,SG) [id number] Left 1 Implanted  SHOULDER HUMERAL BEAR 65M STD - [id number] Shoulder SHOULDER HUMERAL BEAR 65M STD  [employer]ON(ORTH,TRAU,BIO,SG) [id number] Left 1 Implanted  TRAY HUM MINI SHOULDER +3 40 - [id number] Joint TRAY HUM MINI SHOULDER +3 40  ZIMMER [employer]ON(ORTH,TRAU,BIO,SG) [id number] Left 1 Implanted  STEM HUMERAL STRL 9MX55MM - [id number] Stem STEM HUMERAL STRL 9MX55MM  [employer](ORTH,TRAU,BIO,SG) [id number] Left 1 Implanted    OPERATIVE FINDINGS: Rotator cuff arthropathy with irreparable supraspinatus tear.  OPERATIVE PROCEDURE: The patient was brought to the operating room and placed in the supine position. General anesthesia was administered. IV antibiotics were given.  Time out was performed. The upper extremity was prepped and draped in usual sterile fashion. The patient was in a beachchair position. Deltopectoral approach was carried out. The biceps was tenodesed to the pectoralis tendon with #2 Maxbraid. The subscapularis was released off of the bone.   I then performed circumferential releases of the humerus, and then dislocated the head, and then reamed with the reamer to the above named size.  I then applied the jig, and cut the humeral head in 30 of retroversion, and then turned my attention to the glenoid.  Deep retractors were placed, and I resected the labrum, and then placed a guidepin into the center position on the glenoid, with slight inferior inclination. I then reamed  over the guidepin, and this created a small metaphyseal cancellus  blush inferiorly, removing just the cartilage to the subchondral bone superiorly. The base plate was selected and impacted place, and then I secured it centrally with a nonlocking screw, and I had excellent purchase both inferiorly and superiorly. I placed a short locking screws on anterior and posterior aspects.  I then turned my attention to the glenosphere, and impacted this into place, placing slight inferior offset (set on B).   The glenosphere was completely seated, and had engagement of the [facility] taper. I then turned my attention back to the humerus.  I sequentially broached, and then trialed, and was found to restore soft tissue tension, and it had 2 finger tightness. Therefore the above named components were selected. The shoulder felt stable throughout functional motion.  Before I placed the real prosthesis I had also placed a total of 1 #2 and 2 #5 Maxbraid through the humerus for later subscapularis repair.  I then impacted the real prosthesis into place, as well as the real humeral tray, and reduced the shoulder. The shoulder had excellent motion, and was stable, and I irrigated the wounds copiously.    I then used the Maxbraid suture to repair the subscapularis. This came down to bone.  I then irrigated the shoulder copiously once more, repaired the deltopectoral interval with Vicryl followed by subcutaneous Vicryl with Steri-Strips and sterile gauze for the skin. The patient was awakened and returned back in stable and satisfactory condition. There were no complications and She tolerated the procedure well.

## 2023-06-05 NOTE — Anesthesia Procedure Notes (Addendum)
Anesthesia Regional Block: Interscalene brachial plexus block   Pre-Anesthetic Checklist: , timeout performed,  Correct Patient, Correct Site, Correct Laterality,  Correct Procedure, Correct Position, site marked,  Risks and benefits discussed,  Surgical consent,  Pre-op evaluation,  At surgeon's request and post-op pain management  Laterality: Left  Prep: chloraprep       Needles:  Injection technique: Single-shot  Needle Type: Echogenic Needle     Needle Length: 9cm  Needle Gauge: 21     Additional Needles:   Procedures:,,,, ultrasound used (permanent image in chart),,    Narrative:  Start time: 06/05/2023 9:32 AM End time: 06/05/2023 9:39 AM Injection made incrementally with aspirations every 5 mL.  Performed by: Personally  Anesthesiologist: Marcene Duos, MD

## 2023-06-05 NOTE — Transfer of Care (Signed)
Immediate Anesthesia Transfer of Care Note  Patient: Anita Diaz  Procedure(s) Performed: REVERSE SHOULDER ARTHROPLASTY (Left: Shoulder)  Patient Location: PACU  Anesthesia Type:General and Regional  Level of Consciousness: awake  Airway & Oxygen Therapy: Patient Spontanous Breathing  Post-op Assessment: Report given to RN and Post -op Vital signs reviewed and stable  Post vital signs: Reviewed and stable  Last Vitals:  Vitals Value Taken Time  BP 122/68 06/05/23 1245  Temp    Pulse 79 06/05/23 1245  Resp 12 06/05/23 1245  SpO2 95 % 06/05/23 1245  Vitals shown include unfiled device data.  Last Pain:  Vitals:   06/05/23 0941  TempSrc:   PainSc: 0-No pain      Patients Stated Pain Goal: 5 (06/05/23 1610)  Complications: No notable events documented.

## 2023-06-06 ENCOUNTER — Encounter (HOSPITAL_COMMUNITY): Payer: Self-pay | Admitting: Orthopedic Surgery

## 2023-06-06 NOTE — Anesthesia Postprocedure Evaluation (Signed)
Anesthesia Post Note  Patient: Anita Diaz  Procedure(s) Performed: REVERSE SHOULDER ARTHROPLASTY (Left: Shoulder)     Patient location during evaluation: PACU Anesthesia Type: General Level of consciousness: awake and alert Pain management: pain level controlled Vital Signs Assessment: post-procedure vital signs reviewed and stable Respiratory status: spontaneous breathing, nonlabored ventilation, respiratory function stable and patient connected to nasal cannula oxygen Cardiovascular status: blood pressure returned to baseline and stable Postop Assessment: no apparent nausea or vomiting Anesthetic complications: no   No notable events documented.  Last Vitals:  Vitals:   06/05/23 1345 06/05/23 1400  BP: 131/86 134/77  Pulse: 80 80  Resp: (!) 21 15  Temp:  36.8 C  SpO2: 94% 95%    Last Pain:  Vitals:   06/05/23 1400  TempSrc: Oral  PainSc: 0-No pain                 Kennieth Rad

## 2023-06-08 ENCOUNTER — Encounter: Payer: Self-pay | Admitting: Pharmacy Technician

## 2023-06-08 ENCOUNTER — Telehealth: Payer: Self-pay | Admitting: Cardiology

## 2023-06-08 ENCOUNTER — Telehealth: Payer: Self-pay | Admitting: Pharmacy Technician

## 2023-06-08 ENCOUNTER — Other Ambulatory Visit (HOSPITAL_COMMUNITY): Payer: Self-pay

## 2023-06-08 DIAGNOSIS — M19012 Primary osteoarthritis, left shoulder: Secondary | ICD-10-CM | POA: Diagnosis not present

## 2023-06-08 DIAGNOSIS — M25612 Stiffness of left shoulder, not elsewhere classified: Secondary | ICD-10-CM | POA: Diagnosis not present

## 2023-06-08 DIAGNOSIS — M6281 Muscle weakness (generalized): Secondary | ICD-10-CM | POA: Diagnosis not present

## 2023-06-08 NOTE — Telephone Encounter (Signed)
I sent the patient a message because her entresto that's still approved until 12/2023 and she has to just call and ask for shipment. I sent her a mychart.

## 2023-06-08 NOTE — Telephone Encounter (Signed)
*  STAT* If patient is at the pharmacy, call can be transferred to refill team.   1. Which medications need to be refilled? (please list name of each medication and dose if known) new prescription for Entresto   2. Would you like to learn more about the convenience, safety, & potential cost savings by using the [facility]?    3. Are you open to using the [facility] (Type [facility].    4. Which pharmacy/location (including street and city if local pharmacy) is medication to be sent to? [facility] patient Assistance-  [phone]    5. Do they need a 30 day or 90 day supply? 90 days and refills

## 2023-06-11 DIAGNOSIS — M19012 Primary osteoarthritis, left shoulder: Secondary | ICD-10-CM | POA: Diagnosis not present

## 2023-06-11 DIAGNOSIS — M25612 Stiffness of left shoulder, not elsewhere classified: Secondary | ICD-10-CM | POA: Diagnosis not present

## 2023-06-11 DIAGNOSIS — M6281 Muscle weakness (generalized): Secondary | ICD-10-CM | POA: Diagnosis not present

## 2023-06-11 NOTE — Telephone Encounter (Signed)
Pt states that per [employer] our office needs to contact them before pt is able to get medication. Pt would like a c/b once this has been done. Pt also states that she is running low on medication. Please advise

## 2023-06-12 NOTE — Telephone Encounter (Signed)
I called and they said entresto is being processed now. She should receive it by feb 21st. However, She needs to apply to the extra help medicare program and then send to [employer] once she hears back. They are only sending a month for now until they get that letter.  I called her to let her know

## 2023-06-13 DIAGNOSIS — M25612 Stiffness of left shoulder, not elsewhere classified: Secondary | ICD-10-CM | POA: Diagnosis not present

## 2023-06-13 DIAGNOSIS — M19012 Primary osteoarthritis, left shoulder: Secondary | ICD-10-CM | POA: Diagnosis not present

## 2023-06-13 DIAGNOSIS — M6281 Muscle weakness (generalized): Secondary | ICD-10-CM | POA: Diagnosis not present

## 2023-06-13 NOTE — Telephone Encounter (Signed)
 Please see previous messages

## 2023-06-14 ENCOUNTER — Ambulatory Visit (INDEPENDENT_AMBULATORY_CARE_PROVIDER_SITE_OTHER): Payer: Medicare Other

## 2023-06-14 DIAGNOSIS — I428 Other cardiomyopathies: Secondary | ICD-10-CM | POA: Diagnosis not present

## 2023-06-15 ENCOUNTER — Encounter: Payer: Self-pay | Admitting: Internal Medicine

## 2023-06-15 LAB — CUP PACEART REMOTE DEVICE CHECK
Battery Remaining Longevity: 72 mo
Battery Remaining Percentage: 81 %
Brady Statistic RA Percent Paced: 2 %
Brady Statistic RV Percent Paced: 2 %
Date Time Interrogation Session: 20250221044100
HighPow Impedance: 49 Ohm
Lead Channel Impedance Value: 395 Ohm
Lead Channel Impedance Value: 409 Ohm
Lead Channel Impedance Value: 510 Ohm
Lead Channel Setting Pacing Amplitude: 2 V
Lead Channel Setting Pacing Amplitude: 2.4 V
Lead Channel Setting Pacing Amplitude: 2.6 V
Lead Channel Setting Pacing Pulse Width: 0.4 ms
Lead Channel Setting Pacing Pulse Width: 0.4 ms
Lead Channel Setting Sensing Sensitivity: 0.5 mV
Lead Channel Setting Sensing Sensitivity: 1 mV
Pulse Gen Serial Number: 215046
Zone Setting Status: 755011

## 2023-06-15 NOTE — Telephone Encounter (Signed)
This message is from last month. She already had her surgery 10 days ago.

## 2023-06-18 DIAGNOSIS — M19012 Primary osteoarthritis, left shoulder: Secondary | ICD-10-CM | POA: Diagnosis not present

## 2023-06-19 DIAGNOSIS — M6281 Muscle weakness (generalized): Secondary | ICD-10-CM | POA: Diagnosis not present

## 2023-06-19 DIAGNOSIS — M19012 Primary osteoarthritis, left shoulder: Secondary | ICD-10-CM | POA: Diagnosis not present

## 2023-06-19 DIAGNOSIS — M25612 Stiffness of left shoulder, not elsewhere classified: Secondary | ICD-10-CM | POA: Diagnosis not present

## 2023-06-22 DIAGNOSIS — M25612 Stiffness of left shoulder, not elsewhere classified: Secondary | ICD-10-CM | POA: Diagnosis not present

## 2023-06-22 DIAGNOSIS — M19012 Primary osteoarthritis, left shoulder: Secondary | ICD-10-CM | POA: Diagnosis not present

## 2023-06-22 DIAGNOSIS — M6281 Muscle weakness (generalized): Secondary | ICD-10-CM | POA: Diagnosis not present

## 2023-06-27 ENCOUNTER — Other Ambulatory Visit: Payer: Self-pay | Admitting: Family Medicine

## 2023-06-27 DIAGNOSIS — M19012 Primary osteoarthritis, left shoulder: Secondary | ICD-10-CM | POA: Diagnosis not present

## 2023-06-27 DIAGNOSIS — M25612 Stiffness of left shoulder, not elsewhere classified: Secondary | ICD-10-CM | POA: Diagnosis not present

## 2023-06-27 DIAGNOSIS — M6281 Muscle weakness (generalized): Secondary | ICD-10-CM | POA: Diagnosis not present

## 2023-06-28 ENCOUNTER — Other Ambulatory Visit: Payer: Self-pay | Admitting: Family Medicine

## 2023-07-03 DIAGNOSIS — M25612 Stiffness of left shoulder, not elsewhere classified: Secondary | ICD-10-CM | POA: Diagnosis not present

## 2023-07-03 DIAGNOSIS — M19012 Primary osteoarthritis, left shoulder: Secondary | ICD-10-CM | POA: Diagnosis not present

## 2023-07-03 DIAGNOSIS — M6281 Muscle weakness (generalized): Secondary | ICD-10-CM | POA: Diagnosis not present

## 2023-07-05 ENCOUNTER — Other Ambulatory Visit: Payer: Self-pay | Admitting: Family Medicine

## 2023-07-09 ENCOUNTER — Ambulatory Visit (INDEPENDENT_AMBULATORY_CARE_PROVIDER_SITE_OTHER): Payer: Medicare Other

## 2023-07-09 VITALS — BP 94/64 | Ht 62.0 in | Wt 133.0 lb

## 2023-07-09 DIAGNOSIS — Z0000 Encounter for general adult medical examination without abnormal findings: Secondary | ICD-10-CM

## 2023-07-09 DIAGNOSIS — Z1231 Encounter for screening mammogram for malignant neoplasm of breast: Secondary | ICD-10-CM | POA: Diagnosis not present
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
## 2023-07-09 NOTE — Patient Instructions (Signed)
 Anita Diaz , Thank you for taking time to come for your Medicare Wellness Visit. I appreciate your ongoing commitment to your health goals. Please review the following plan we discussed and let me know if I can assist you in the future.   Referrals/Orders/Follow-Ups/Clinician Recommendations: Please discuss with your regarding your referral for ophthalmologist.  This is a list of the screening recommended for you and due dates:  Health Maintenance  Topic Date Due   Zoster (Shingles) Vaccine (1 of 2) Never done   Flu Shot  07/23/2023*   Mammogram  08/11/2023   Medicare Annual Wellness Visit  07/08/2024   DEXA scan (bone density measurement)  10/03/2024   DTaP/Tdap/Td vaccine (2 - Td or Tdap) 03/24/2031   Colon Cancer Screening  06/19/2032   Pneumonia Vaccine  Completed   Hepatitis C Screening  Completed   HPV Vaccine  Aged Out   COVID-19 Vaccine  Discontinued  *Topic was postponed. The date shown is not the original due date.    Advanced directives: (Copy Requested) Please bring a copy of your health care power of attorney and living will to the office to be added to your chart at your convenience. You can mail to [facility] [address] [place], [place] [address] or email to [email or web address]  Next Medicare Annual Wellness Visit scheduled for next year: Yes

## 2023-07-10 DIAGNOSIS — M19012 Primary osteoarthritis, left shoulder: Secondary | ICD-10-CM | POA: Diagnosis not present

## 2023-07-10 DIAGNOSIS — M6281 Muscle weakness (generalized): Secondary | ICD-10-CM | POA: Diagnosis not present

## 2023-07-10 DIAGNOSIS — M25612 Stiffness of left shoulder, not elsewhere classified: Secondary | ICD-10-CM | POA: Diagnosis not present

## 2023-07-12 ENCOUNTER — Other Ambulatory Visit: Payer: Self-pay | Admitting: Family Medicine

## 2023-07-12 ENCOUNTER — Telehealth: Admitting: Family Medicine

## 2023-07-12 VITALS — Ht 62.0 in | Wt 126.0 lb

## 2023-07-12 DIAGNOSIS — G8929 Other chronic pain: Secondary | ICD-10-CM | POA: Diagnosis not present

## 2023-07-12 DIAGNOSIS — M25512 Pain in left shoulder: Secondary | ICD-10-CM

## 2023-07-12 DIAGNOSIS — F321 Major depressive disorder, single episode, moderate: Secondary | ICD-10-CM | POA: Diagnosis not present

## 2023-07-12 MED ORDER — SERTRALINE HCL 100 MG PO TABS: 100.0000 mg | ORAL_TABLET | Freq: Every day | ORAL | 3 refills | Status: AC

## 2023-07-12 NOTE — Progress Notes (Signed)
   Anita Diaz is a 68 y.o. female who presents today for a virtual office visit.  Assessment/Plan:  Chronic Problems Addressed Today: Depression, major, single episode, moderate (HCC) Overall her mood is very well-controlled.  No SI or HI.  She has been taking both the Cymbalta 60 mg daily and Zoloft 100 mg daily for the last several months to years despite [place] trying to get her off of Zoloft multiple times.  Apparently there has been confusion with the prescriptions at the pharmacy.  Overall she has been tolerating this combination well without any signs or symptoms of serotonin syndrome.  She is also prescribed Wellbutrin 150 mg daily which she seems to be tolerating as well.  We did discuss that she is at a higher risk for serotonin syndrome with being both on Cymbalta and Zoloft however given that she has tolerated this well for the last several months to years in addition to her mood being well-controlled at this point it would be reasonable for [place] to continue with the current regimen.  We did discuss warning signs and risk factors for serotonin syndrome.  Also discussed referral to psychiatry however deferred.  She will follow-up with [place] here in a few months.  Chronic left shoulder pain Doing well status post shoulder replacement surgery.     Subjective:  HPI:  See A/P for status of chronic conditions.  Patient is here today for follow-up.  Last saw her about 5 months ago.  At her last visit she had elevated PHQ score.  Upon further questioning we determined that she had been taking both Cymbalta 60 mg daily and sertraline 100 mg daily for several weeks.  At our last visit we had recommended discontinuing the sertraline and continue Wellbutrin alone.  Patient today states that she continued with both the Cymbalta and the sertraline at the previous dose since her last visit.  She believes that she is also started the Wellbutrin however is not sure about this.  Overall she feels like her mood  is very well-controlled and would like to continue with both the Cymbalta and sertraline.  She has not had any signs or symptoms of serotonin syndrome.  No shakiness.  No agitation.  No diaphoresis or fevers.  Mood is very well-controlled with her current regimen.  She is doing very well since shoulder replacement surgery a few weeks ago.        Objective/Observations  Physical Exam: Gen: NAD, resting comfortably Pulm: Normal work of breathing Neuro: Grossly normal, moves all extremities Psych: Normal affect and thought content  Virtual Visit via Video   I connected with Anita Diaz on 07/12/23 at  2:00 PM EDT by a video enabled telemedicine application and verified that I am speaking with the correct person using two identifiers. The limitations of evaluation and management by telemedicine and the availability of in person appointments were discussed. The patient expressed understanding and agreed to proceed.   Patient location: Home Provider location: [place] [facility] [employer] Persons participating in the virtual visit: Myself and Patient     [name]. [name], MD 07/12/2023 2:35 PM

## 2023-07-12 NOTE — Assessment & Plan Note (Signed)
 Overall her mood is very well-controlled.  No SI or HI.  She has been taking both the Cymbalta 60 mg daily and Zoloft 100 mg daily for the last several months to years despite [place] trying to get her off of Zoloft multiple times.  Apparently there has been confusion with the prescriptions at the pharmacy.  Overall she has been tolerating this combination well without any signs or symptoms of serotonin syndrome.  She is also prescribed Wellbutrin 150 mg daily which she seems to be tolerating as well.  We did discuss that she is at a higher risk for serotonin syndrome with being both on Cymbalta and Zoloft however given that she has tolerated this well for the last several months to years in addition to her mood being well-controlled at this point it would be reasonable for [place] to continue with the current regimen.  We did discuss warning signs and risk factors for serotonin syndrome.  Also discussed referral to psychiatry however deferred.  She will follow-up with [place] here in a few months.

## 2023-07-12 NOTE — Assessment & Plan Note (Signed)
 Doing well status post shoulder replacement surgery.

## 2023-07-13 ENCOUNTER — Ambulatory Visit
Admission: RE | Admit: 2023-07-13 | Discharge: 2023-07-13 | Disposition: A | Discharge status: Home / Self Care | Source: Ambulatory Visit | Attending: Family Medicine | Admitting: Family Medicine

## 2023-07-13 ENCOUNTER — Ambulatory Visit: Admitting: Family Medicine

## 2023-07-13 DIAGNOSIS — Z0000 Encounter for general adult medical examination without abnormal findings: Secondary | ICD-10-CM

## 2023-07-13 DIAGNOSIS — Z1231 Encounter for screening mammogram for malignant neoplasm of breast: Secondary | ICD-10-CM | POA: Diagnosis not present

## 2023-07-16 DIAGNOSIS — M7022 Olecranon bursitis, left elbow: Secondary | ICD-10-CM | POA: Diagnosis not present

## 2023-07-16 DIAGNOSIS — M25522 Pain in left elbow: Secondary | ICD-10-CM | POA: Diagnosis not present

## 2023-07-17 ENCOUNTER — Other Ambulatory Visit: Payer: Self-pay | Admitting: Family Medicine

## 2023-07-17 DIAGNOSIS — M25612 Stiffness of left shoulder, not elsewhere classified: Secondary | ICD-10-CM | POA: Diagnosis not present

## 2023-07-17 DIAGNOSIS — M6281 Muscle weakness (generalized): Secondary | ICD-10-CM | POA: Diagnosis not present

## 2023-07-17 DIAGNOSIS — M19012 Primary osteoarthritis, left shoulder: Secondary | ICD-10-CM | POA: Diagnosis not present

## 2023-07-18 ENCOUNTER — Telehealth: Payer: Self-pay | Admitting: Cardiology

## 2023-07-18 ENCOUNTER — Other Ambulatory Visit: Payer: Self-pay | Admitting: Family Medicine

## 2023-07-18 MED ORDER — SACUBITRIL-VALSARTAN 24-26 MG PO TABS
1.0000 | ORAL_TABLET | Freq: Two times a day (BID) | ORAL | Status: DC
Start: 2023-07-18 — End: 2023-08-08

## 2023-07-18 NOTE — Addendum Note (Signed)
 Addended by: Elease Etienne A on: 07/18/2023 10:34 AM   Modules accepted: Orders

## 2023-07-18 NOTE — Telephone Encounter (Signed)
 Pt was left 2 weeks of Entresto 24-26 mg tablets at [facility] front desk for pt to pick up.

## 2023-07-18 NOTE — Progress Notes (Signed)
 Remote ICD transmission.

## 2023-07-18 NOTE — Telephone Encounter (Signed)
 Patient calling the office for samples of medication:   1.  What medication and dosage are you requesting samples for? Entresto  2.  Are you currently out of this medication? Yes- waiting on Social Security to see if they are going to help her with her medicine

## 2023-07-20 ENCOUNTER — Other Ambulatory Visit (HOSPITAL_COMMUNITY): Payer: Self-pay

## 2023-07-20 ENCOUNTER — Telehealth: Payer: Self-pay

## 2023-07-20 NOTE — Telephone Encounter (Signed)
 Pt walked in to pick up medication and is requesting clarification of Entresto 24-26mg  instructions as sample label says take 1 tablet by mouth bid.  Pt advised BID means twice daily.  Pt is to take Entresto 24-26mg  - 1 tablet by mouth twice daily.  Pt verbalizes understanding and thanked Charity fundraiser for the assistance.

## 2023-07-20 NOTE — Telephone Encounter (Signed)
 Pharmacy Patient Advocate Encounter   Received notification from Onbase that prior authorization for diazePAM 5MG  tablets is required/requested.   Insurance verification completed.   The patient is insured through [employer]  .   Per test claim: PA required; PA submitted to above mentioned insurance via CoverMyMeds Key/confirmation #/[id number] Status is pending

## 2023-07-20 NOTE — Telephone Encounter (Signed)
 Patient stated she is waiting for Social Security to return her call regarding getting medication through [employer].  Patient stated she will stop by to collect the medication from the front desk.

## 2023-07-24 ENCOUNTER — Other Ambulatory Visit (HOSPITAL_COMMUNITY): Payer: Self-pay

## 2023-07-24 DIAGNOSIS — M19012 Primary osteoarthritis, left shoulder: Secondary | ICD-10-CM | POA: Diagnosis not present

## 2023-07-24 DIAGNOSIS — M25612 Stiffness of left shoulder, not elsewhere classified: Secondary | ICD-10-CM | POA: Diagnosis not present

## 2023-07-24 DIAGNOSIS — M6281 Muscle weakness (generalized): Secondary | ICD-10-CM | POA: Diagnosis not present

## 2023-07-24 NOTE — Telephone Encounter (Signed)
 Pharmacy Patient Advocate Encounter  Received notification from [facility]  that Prior Authorization for diazePAM 5MG  tablets has been APPROVED from 07/20/23 to 07/19/24. Ran test claim, Copay is $3. This test claim was processed through [facility]- copay amounts may vary at other pharmacies due to pharmacy/plan contracts, or as the patient moves through the different stages of their insurance plan.   PA #/Case ID/Reference #: [id number]

## 2023-07-25 ENCOUNTER — Other Ambulatory Visit: Payer: Self-pay | Admitting: Family Medicine

## 2023-07-26 ENCOUNTER — Telehealth: Payer: Self-pay | Admitting: Cardiology

## 2023-07-26 ENCOUNTER — Other Ambulatory Visit (HOSPITAL_COMMUNITY): Payer: Self-pay

## 2023-07-26 NOTE — Telephone Encounter (Signed)
 Pt c/o medication issue:  1. Name of Medication:    sacubitril-valsartan (ENTRESTO) 24-26 MG    2. How are you currently taking this medication (dosage and times per day)? As written   3. Are you having a reaction (difficulty breathing--STAT)? No   4. What is your medication issue? Pt called in stating that her insurance sent her a letter stating they will not cover this medication anymore. Please advise of other assistance options.

## 2023-07-26 NOTE — Telephone Encounter (Signed)
 I called the patient and she gets her entresto through [employer]. She is going to drop off the denial letter of the extra help from medicare. Once she drops this off, if this can be sent to [phone] and we can send it to [employer] for her. She is approved until 12/2023 but they have her approval on hold until they get this letter

## 2023-07-27 ENCOUNTER — Other Ambulatory Visit: Payer: Self-pay | Admitting: Cardiology

## 2023-07-27 NOTE — Telephone Encounter (Signed)
 Faxed her extra help denial to [employer]

## 2023-07-27 NOTE — Telephone Encounter (Signed)
 07/26/23  3:36 PM Note I called the patient and she gets her entresto through [employer]. She is going to drop off the denial letter of the extra help from medicare. Once she drops this off, if this can be sent to [phone] and we can send it to [employer] for her. She is approved until 12/2023 but they have her approval on hold until they get this letter

## 2023-07-30 ENCOUNTER — Telehealth: Payer: Self-pay | Admitting: *Deleted

## 2023-07-30 ENCOUNTER — Telehealth: Payer: Self-pay

## 2023-07-30 ENCOUNTER — Encounter: Payer: Self-pay | Admitting: Cardiology

## 2023-07-30 DIAGNOSIS — M19012 Primary osteoarthritis, left shoulder: Secondary | ICD-10-CM | POA: Diagnosis not present

## 2023-07-30 DIAGNOSIS — M7022 Olecranon bursitis, left elbow: Secondary | ICD-10-CM | POA: Diagnosis not present

## 2023-07-30 NOTE — Telephone Encounter (Signed)
   Name: [name]  DOB: 31-Jan-1956  MRN: [id number]  Primary Cardiologist: Donato Schultz, MD   Preoperative team, please contact this patient and set up a phone call appointment for further preoperative risk assessment. Please obtain consent and complete medication review. Thank you for your help.  I confirm that guidance regarding antiplatelet and oral anticoagulation therapy has been completed and, if necessary, noted below.  Plavix prescribed by a noncardiology provider (PCP for history of CVA) therefore recommendations for holding deferred to prescribing provider.    I also confirmed the patient resides in the state of [place]. As per [facility] Medical Board telemedicine laws, the patient must reside in the state in which the provider is licensed.   Carlos Levering, NP 07/30/2023, 2:48 PM [facility]

## 2023-07-30 NOTE — Telephone Encounter (Signed)
   Pre-operative Risk Assessment    Patient Name: Anita Diaz  DOB: 10/19/1955 MRN: [id number]   Date of last office visit: 05/04/23 TELE PREOP APPT; 11/01/22 Robin Searing, NP Date of next office visit: 11/15/23 DR. SKAINS   Request for Surgical Clearance    Procedure:   LEFT ELBOW BURSECTOMY (SEPTIC BURSITIS)  Date of Surgery:  Clearance 08/07/23                                Surgeon:  DR. Teryl Lucy Surgeon's Group or Practice Name:  [facility] Phone number:  [phone] EXT [address] [facility] Fax number:  [phone]   Type of Clearance Requested:   - Medical  - Pharmacy:  Hold Clopidogrel (Plavix)     Type of Anesthesia:   CHOICE   Additional requests/questions:    Elpidio Anis   07/30/2023, 2:38 PM

## 2023-07-30 NOTE — Telephone Encounter (Signed)
 Pt calling in to see if [employer] received the letter. Please advise.

## 2023-07-30 NOTE — Telephone Encounter (Signed)
 Copied from CRM [id number]. Topic: Clinical - Medical Advice >> Jul 30, 2023  1:51 PM Sonny Dandy B wrote: Reason for CRM: Cordelia Pen calling from dr Dion Saucier office [phone]  called to speak with provider state pt is scheduled for  surgery on  08/07/23 would like to know when they can stop clopidogrel (PLAVIX) 75 MG tablet for pt. Please call back urgent response is needed   Please advise  [facility]

## 2023-07-30 NOTE — Telephone Encounter (Signed)
 Spoke with patient who is agreeable to do a tele visit on 4/11 at 1:40PM. Med rec and consent have been done.

## 2023-07-30 NOTE — Telephone Encounter (Addendum)
 I faxed on 07/27/23. I called today and they said: they still do not see that they have it but it could take up to 24-48 hours for it to be uploaded. I will check again later. I called the patient to let her know     07/27/23  7:33am got confirmation Note Faxed her extra help denial to [employer]

## 2023-07-30 NOTE — Telephone Encounter (Signed)
  Patient Consent for Virtual Visit        Anita Diaz has provided verbal consent on 07/30/2023 for a virtual visit (video or telephone).   CONSENT FOR VIRTUAL VISIT FOR:  Anita Diaz  By participating in this virtual visit I agree to the following:  I hereby voluntarily request, consent and authorize [facility] and its employed or contracted physicians, physician assistants, nurse practitioners or other licensed health care professionals (the Practitioner), to provide me with telemedicine health care services (the "Services") as deemed necessary by the treating Practitioner. I acknowledge and consent to receive the Services by the Practitioner via telemedicine. I understand that the telemedicine visit will involve communicating with the Practitioner through live audiovisual communication technology and the disclosure of certain medical information by electronic transmission. I acknowledge that I have been given the opportunity to request an in-person assessment or other available alternative prior to the telemedicine visit and am voluntarily participating in the telemedicine visit.  I understand that I have the right to withhold or withdraw my consent to the use of telemedicine in the course of my care at any time, without affecting my right to future care or treatment, and that the Practitioner or I may terminate the telemedicine visit at any time. I understand that I have the right to inspect all information obtained and/or recorded in the course of the telemedicine visit and may receive copies of available information for a reasonable fee.  I understand that some of the potential risks of receiving the Services via telemedicine include:  Delay or interruption in medical evaluation due to technological equipment failure or disruption; Information transmitted may not be sufficient (e.g. poor resolution of images) to allow for appropriate medical decision making by the Practitioner;  and/or  In rare instances, security protocols could fail, causing a breach of personal health information.  Furthermore, I acknowledge that it is my responsibility to provide information about my medical history, conditions and care that is complete and accurate to the best of my ability. I acknowledge that Practitioner's advice, recommendations, and/or decision may be based on factors not within their control, such as incomplete or inaccurate data provided by me or distortions of diagnostic images or specimens that may result from electronic transmissions. I understand that the practice of medicine is not an exact science and that Practitioner makes no warranties or guarantees regarding treatment outcomes. I acknowledge that a copy of this consent can be made available to me via my patient portal [facility] MyChart), or I can request a printed copy by calling the office of [facility].    I understand that my insurance will be billed for this visit.   I have read or had this consent read to me. I understand the contents of this consent, which adequately explains the benefits and risks of the Services being provided via telemedicine.  I have been provided ample opportunity to ask questions regarding this consent and the Services and have had my questions answered to my satisfaction. I give my informed consent for the services to be provided through the use of telemedicine in my medical care

## 2023-07-30 NOTE — Telephone Encounter (Signed)
 Error

## 2023-07-31 DIAGNOSIS — M25612 Stiffness of left shoulder, not elsewhere classified: Secondary | ICD-10-CM | POA: Diagnosis not present

## 2023-07-31 DIAGNOSIS — M19012 Primary osteoarthritis, left shoulder: Secondary | ICD-10-CM | POA: Diagnosis not present

## 2023-07-31 DIAGNOSIS — M6281 Muscle weakness (generalized): Secondary | ICD-10-CM | POA: Diagnosis not present

## 2023-07-31 NOTE — Telephone Encounter (Signed)
 Called and LM for Cordelia Pen to inform her that this patient sees Cardiologist Donato Schultz and he should be the one making this decision.  No further action should be necessary

## 2023-07-31 NOTE — Telephone Encounter (Signed)
 This needs to be addressed by her cardiologist.  Anita Diaz. [name], MD 07/31/2023 8:04 AM

## 2023-07-31 NOTE — Progress Notes (Signed)
 Patient has Hx CHF with decreased EF 25%. She had a Pacemaker /ICD inserted 2019. In compliance with [facility] Guidelines, patient will need to be done at Main OR. Sherri at Dr Shelba Flake office notified of need to move patient to Main OR.

## 2023-08-01 NOTE — Telephone Encounter (Signed)
 Hi, we have submitted her denial to [employer] and they are still processing it. She only has 5 days left. I am sending to see if its ok to give her more samples or when it would be ok to? I have it on my list to keep checking with [employer] on her entresto.

## 2023-08-01 NOTE — Telephone Encounter (Signed)
 Please give 2 week sample, waiting on PAP

## 2023-08-01 NOTE — Telephone Encounter (Addendum)
 They still saying its not uploaded on her acct. She said that doesn't mean they havent received it but means its not on her acct yet. I faxed again just in case. I called patient to let her know   Confirmation: To:               Recipient at [phone] Subject:          secure sending a second time Result:           The transmission was successful. Explanation:      All Pages Ok Pages Sent:       3 Connect Time:     0 minutes, 57 seconds Transmit Time:    08/01/2023 09:23

## 2023-08-01 NOTE — Progress Notes (Signed)
 Surgery orders requested via Epic inbox.

## 2023-08-01 NOTE — Telephone Encounter (Signed)
 Patient is aware. She only has 5 days left.

## 2023-08-02 DIAGNOSIS — Z87891 Personal history of nicotine dependence: Secondary | ICD-10-CM | POA: Diagnosis not present

## 2023-08-02 DIAGNOSIS — D6869 Other thrombophilia: Secondary | ICD-10-CM | POA: Diagnosis not present

## 2023-08-02 MED ORDER — ENTRESTO 24-26 MG PO TABS: 1.0000 | ORAL_TABLET | Freq: Two times a day (BID) | ORAL | 0 refills | Status: AC

## 2023-08-02 NOTE — Telephone Encounter (Signed)
 Patient calling the office for samples of medication:   1.  What medication and dosage are you requesting samples for?  sacubitril-valsartan (ENTRESTO) 24-26 MG   2.  Are you currently out of this medication?    Patient called to follow-up on if samples are available for her to pick up.  Patient stated she has 4 tablets left.

## 2023-08-02 NOTE — Telephone Encounter (Signed)
 PAP: Patient assistance application for Sherryll Burger has been approved by PAP Companies: [employer] from 08/02/23 to 04/23/24. Medication should be delivered to PAP Delivery: Home. For further shipping updates, please contact [employer] at [phone]. Patient ID is: [id number]

## 2023-08-02 NOTE — Telephone Encounter (Signed)
 Pt aware 2 weeks of samples left at front desk for her to pick up.  She appreciates our help with this.

## 2023-08-03 ENCOUNTER — Encounter: Payer: Self-pay | Admitting: Internal Medicine

## 2023-08-03 ENCOUNTER — Ambulatory Visit: Attending: Cardiology

## 2023-08-03 DIAGNOSIS — Z01810 Encounter for preprocedural cardiovascular examination: Secondary | ICD-10-CM | POA: Diagnosis not present

## 2023-08-03 NOTE — Telephone Encounter (Signed)
 Patient finally approved. [employer] is working on sending her the medication. No eta of delivery as of now

## 2023-08-03 NOTE — Patient Instructions (Addendum)
 SURGICAL WAITING ROOM VISITATION  Patients having surgery or a procedure may have no more than 2 support people in the waiting area - these visitors may rotate.    Children under the age of 35 must have an adult with them who is not the patient.  Due to an increase in RSV and influenza rates and associated hospitalizations, children ages 8 and under may not visit patients in [facility] hospitals.  Visitors with respiratory illnesses are discouraged from visiting and should remain at home.  If the patient needs to stay at the hospital during part of their recovery, the visitor guidelines for inpatient rooms apply. Pre-op nurse will coordinate an appropriate time for 1 support person to accompany patient in pre-op.  This support person may not rotate.    Please refer to the [facility] website for the visitor guidelines for Inpatients (after your surgery is over and you are in a regular room).    Your procedure is scheduled on: 08/07/23   Report to [facility] Main Entrance    Report to admitting at 11:30 AM   Call this number if you have problems the morning of surgery [phone]   Do not eat food :After Midnight.   After Midnight you may have the following liquids until 10:45 AM DAY OF SURGERY  Water Non-Citrus Juices (without pulp, NO RED-Apple, White grape, White cranberry) Black Coffee (NO MILK/CREAM OR CREAMERS, sugar ok)  Clear Tea (NO MILK/CREAM OR CREAMERS, sugar ok) regular and decaf                             Plain Jell-O (NO RED)                                           Fruit ices (not with fruit pulp, NO RED)                                     Popsicles (NO RED)                                                               Sports drinks like Gatorade (NO RED)               The day of surgery:  Drink ONE (1) Pre-Surgery Clear Ensure at 10:45 AM the morning of surgery. Drink in one sitting. Do not sip.  This drink was given to you during your hospital   pre-op appointment visit. Nothing else to drink after completing the  Pre-Surgery Clear Ensure.          If you have questions, please contact your surgeon's office.   FOLLOW BOWEL PREP AND ANY ADDITIONAL PRE OP INSTRUCTIONS YOU RECEIVED FROM YOUR SURGEON'S OFFICE!!!     Oral Hygiene is also important to reduce your risk of infection.                                    Remember - BRUSH YOUR  TEETH THE MORNING OF SURGERY WITH YOUR REGULAR TOOTHPASTE  DENTURES WILL BE REMOVED PRIOR TO SURGERY PLEASE DO NOT APPLY "Poly grip" OR ADHESIVES!!!   Stop all vitamins and herbal supplements 7 days before surgery.   Take these medicines the morning of surgery with A SIP OF WATER: Tylenol, Inhalers, Atorvastatin, Bupropion, Carvedilol, Diazepam, Duloxetine, Zetia, Claritin, Zofran Pantoprazole, Sertraline  These are anesthesia recommendations for holding your anticoagulants.  Please contact your prescribing physician to confirm IF it is safe to hold your anticoagulants for this length of time.   Eliquis Apixaban   72 hours   Xarelto Rivaroxaban   72 hours  Plavix Clopidogrel   120 hours  Pletal Cilostazol   120 hours                                You may not have any metal on your body including hair pins, jewelry, and body piercing             Do not wear make-up, lotions, powders, perfumes, or deodorant  Do not wear nail polish including gel and S&S, artificial/acrylic nails, or any other type of covering on natural nails including finger and toenails. If you have artificial nails, gel coating, etc. that needs to be removed by a nail salon please have this removed prior to surgery or surgery may need to be canceled/ delayed if the surgeon/ anesthesia feels like they are unable to be safely monitored.   Do not shave  48 hours prior to surgery.    Do not bring valuables to the hospital. [facility] IS NOT             RESPONSIBLE   FOR VALUABLES.   Contacts, glasses, dentures or bridgework  may not be worn into surgery.  DO NOT BRING YOUR HOME MEDICATIONS TO THE HOSPITAL. PHARMACY WILL DISPENSE MEDICATIONS LISTED ON YOUR MEDICATION LIST TO YOU DURING YOUR ADMISSION IN THE HOSPITAL!    Patients discharged on the day of surgery will not be allowed to drive home.  Someone NEEDS to stay with you for the first 24 hours after anesthesia.              Please read over the following fact sheets you were given: IF YOU HAVE QUESTIONS ABOUT YOUR PRE-OP INSTRUCTIONS PLEASE CALL [name]    If you received a COVID test during your pre-op visit  it is requested that you wear a mask when out in public, stay away from anyone that may not be feeling well and notify your surgeon if you develop symptoms. If you test positive for Covid or have been in contact with anyone that has tested positive in the last 10 days please notify you surgeon.    [facility] - Preparing for Surgery Before surgery, you can play an important role.  Because skin is not sterile, your skin needs to be as free of germs as possible.  You can reduce the number of germs on your skin by washing with CHG (chlorahexidine gluconate) soap before surgery.  CHG is an antiseptic cleaner which kills germs and bonds with the skin to continue killing germs even after washing. Please DO NOT use if you have an allergy to CHG or antibacterial soaps.  If your skin becomes reddened/irritated stop using the CHG and inform your nurse when you arrive at Short Stay. Do not shave (including legs and underarms) for at least 48  hours prior to the first CHG shower.  You may shave your face/neck.  Please follow these instructions carefully:  1.  Shower with CHG Soap the night before surgery and the  morning of surgery.  2.  If you choose to wash your hair, wash your hair first as usual with your normal  shampoo.  3.  After you shampoo, rinse your hair and body thoroughly to remove the shampoo.                             4.  Use CHG as you  would any other liquid soap.  You can apply chg directly to the skin and wash.  Gently with a scrungie or clean washcloth.  5.  Apply the CHG Soap to your body ONLY FROM THE NECK DOWN.   Do   not use on face/ open                           Wound or open sores. Avoid contact with eyes, ears mouth and   genitals (private parts).                       Wash face,  Genitals (private parts) with your normal soap.             6.  Wash thoroughly, paying special attention to the area where your    surgery  will be performed.  7.  Thoroughly rinse your body with warm water from the neck down.  8.  DO NOT shower/wash with your normal soap after using and rinsing off the CHG Soap.                9.  Pat yourself dry with a clean towel.            10.  Wear clean pajamas.            11.  Place clean sheets on your bed the night of your first shower and do not  sleep with pets. Day of Surgery : Do not apply any lotions/deodorants the morning of surgery.  Please wear clean clothes to the hospital/surgery center.  FAILURE TO FOLLOW THESE INSTRUCTIONS MAY RESULT IN THE CANCELLATION OF YOUR SURGERY  PATIENT SIGNATURE_________________________________  NURSE SIGNATURE__________________________________  ________________________________________________________________________

## 2023-08-03 NOTE — Progress Notes (Signed)
 PERIOPERATIVE PRESCRIPTION FOR IMPLANTED CARDIAC DEVICE PROGRAMMING  Patient Information: Name:  Anita Diaz  DOB:  11/24/1955  MRN:  [id number]    Planned Procedure:  left elbow bursectomy  Surgeon:  Dr. Dion Saucier  Date of Procedure:  08/07/23  Cautery will be used.  Position during surgery:  unknown   Please send documentation back to:  [facility] (Fax # [phone])  Device Information:  Clinic EP Physician:  Lewayne Bunting, MD   Device Type:  Defibrillator Manufacturer and Phone #:  [employer]: [phone] Pacemaker Dependent?:  No. Date of Last Device Check:  06/21/23 Normal Device Function?:  Yes.    Electrophysiologist's Recommendations:  Have magnet available. Provide continuous ECG monitoring when magnet is used or reprogramming is to be performed.  Procedure may interfere with device function.  Magnet should be placed over device during procedure.  Per Device Clinic Standing Orders, Lisbeth Renshaw, RN  1:52 PM 08/03/2023
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
## 2023-08-03 NOTE — Progress Notes (Addendum)
 COVID Vaccine Completed: yes  Date of COVID positive in last 90 days: no  PCP - Valdene Garret, MD Cardiologist - Dorothye Gathers, MD  Cardiac clearance 08/03/23 by Rejeana Card, NP in Epic   Chest x-ray - 05/07/23 Epic EKG - 08/06/23 Epic/chart Stress Test - n/a ECHO - 01/14/21 Epic Cardiac Cath - 06/29/2017 Epic Pacemaker/ICD device last checked: 06/15/23 Epic, device orders in Epic. Emailed  device rep. Spinal Cord Stimulator: n/a  Bowel Prep - no  Sleep Study - n/a CPAP -   Fasting Blood Sugar - n/a Checks Blood Sugar _____ times a day  Last dose of GLP1 agonist-  N/A GLP1 instructions:  Hold 7 days before surgery    Last dose of SGLT-2 inhibitors-  N/A SGLT-2 instructions:  Hold 3 days before surgery    Blood Thinner Instructions: Plavix, hold 7 days per pt. Prescribed by Dr. Daneil Dunker, did not get instructions so patient stated that she just stopped on her own because she has had to hold in past for surgeries.  Aspirin Instructions: Last Dose: 07/30/23  Activity level: Can go up a flight of stairs and perform activities of daily living without stopping and without symptoms of chest pain or shortness of breath.  Anesthesia review: HTN, CHF, LBBB, SVT, CVA, pacemaker  Patient denies shortness of breath, fever, cough and chest pain at PAT appointment  Patient verbalized understanding of instructions that were given to them at the PAT appointment. Patient was also instructed that they will need to review over the PAT instructions again at home before surgery.

## 2023-08-06 ENCOUNTER — Encounter (HOSPITAL_COMMUNITY): Payer: Self-pay

## 2023-08-06 ENCOUNTER — Other Ambulatory Visit: Payer: Self-pay

## 2023-08-06 ENCOUNTER — Encounter (HOSPITAL_COMMUNITY)
Admission: RE | Admit: 2023-08-06 | Discharge: 2023-08-06 | Disposition: A | Discharge status: Home / Self Care | Source: Ambulatory Visit | Attending: Orthopedic Surgery | Admitting: Orthopedic Surgery

## 2023-08-06 VITALS — BP 127/86 | HR 67 | Temp 98.1°F | Resp 16 | Ht 62.0 in | Wt 135.0 lb

## 2023-08-06 DIAGNOSIS — R9431 Abnormal electrocardiogram [ECG] [EKG]: Secondary | ICD-10-CM | POA: Diagnosis not present

## 2023-08-06 DIAGNOSIS — Z95810 Presence of automatic (implantable) cardiac defibrillator: Secondary | ICD-10-CM | POA: Diagnosis not present

## 2023-08-06 DIAGNOSIS — I110 Hypertensive heart disease with heart failure: Secondary | ICD-10-CM | POA: Insufficient documentation

## 2023-08-06 DIAGNOSIS — Z01818 Encounter for other preprocedural examination: Secondary | ICD-10-CM | POA: Diagnosis not present

## 2023-08-06 DIAGNOSIS — Z7902 Long term (current) use of antithrombotics/antiplatelets: Secondary | ICD-10-CM | POA: Insufficient documentation

## 2023-08-06 DIAGNOSIS — Z87891 Personal history of nicotine dependence: Secondary | ICD-10-CM | POA: Insufficient documentation

## 2023-08-06 DIAGNOSIS — I5022 Chronic systolic (congestive) heart failure: Secondary | ICD-10-CM | POA: Diagnosis not present

## 2023-08-06 DIAGNOSIS — Z8673 Personal history of transient ischemic attack (TIA), and cerebral infarction without residual deficits: Secondary | ICD-10-CM | POA: Insufficient documentation

## 2023-08-06 DIAGNOSIS — M71122 Other infective bursitis, left elbow: Secondary | ICD-10-CM | POA: Diagnosis not present

## 2023-08-06 DIAGNOSIS — F319 Bipolar disorder, unspecified: Secondary | ICD-10-CM | POA: Insufficient documentation

## 2023-08-06 HISTORY — DX: Headache, unspecified: R51.9

## 2023-08-06 LAB — CBC
HCT: 37.3 % (ref 36.0–46.0)
Hemoglobin: 11.2 g/dL — ABNORMAL LOW (ref 12.0–15.0)
MCH: 27.6 pg (ref 26.0–34.0)
MCHC: 30 g/dL (ref 30.0–36.0)
MCV: 91.9 fL (ref 80.0–100.0)
Platelets: 341 10*3/uL (ref 150–400)
RBC: 4.06 MIL/uL (ref 3.87–5.11)
RDW: 13.3 % (ref 11.5–15.5)
WBC: 6.6 10*3/uL (ref 4.0–10.5)
nRBC: 0 % (ref 0.0–0.2)

## 2023-08-06 LAB — BASIC METABOLIC PANEL WITH GFR
Anion gap: 7 (ref 5–15)
BUN: 18 mg/dL (ref 8–23)
CO2: 26 mmol/L (ref 22–32)
Calcium: 8.8 mg/dL — ABNORMAL LOW (ref 8.9–10.3)
Chloride: 104 mmol/L (ref 98–111)
Creatinine, Ser: 0.62 mg/dL (ref 0.44–1.00)
GFR, Estimated: >60 mL/min (ref >60)
Glucose, Bld: 102 mg/dL — ABNORMAL HIGH (ref 70–99)
Potassium: 4.6 mmol/L (ref 3.5–5.1)
Sodium: 137 mmol/L (ref 135–145)
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
## 2023-08-06 NOTE — Anesthesia Preprocedure Evaluation (Signed)
 Anesthesia Evaluation  Patient identified by MRN, date of birth, ID band Patient awake    Reviewed: Allergy & Precautions, H&P , NPO status , Patient's Chart, lab work & pertinent test results, reviewed documented beta blocker date and time   Airway Mallampati: II  TM Distance: >3 FB Neck ROM: Full    Dental no notable dental hx. (+) Teeth Intact, Dental Advisory Given   Pulmonary former smoker   Pulmonary exam normal breath sounds clear to auscultation       Cardiovascular hypertension, Pt. on medications and Pt. on home beta blockers + dysrhythmias + pacemaker  Rhythm:Regular Rate:Normal     Neuro/Psych  Headaches   Depression Bipolar Disorder   TIA   GI/Hepatic negative GI ROS, Neg liver ROS,,,  Endo/Other  negative endocrine ROS    Renal/GU negative Renal ROS  negative genitourinary   Musculoskeletal  (+) Arthritis , Osteoarthritis,    Abdominal   Peds  Hematology negative hematology ROS (+)   Anesthesia Other Findings   Reproductive/Obstetrics negative OB ROS                             Anesthesia Physical Anesthesia Plan  ASA: 3  Anesthesia Plan: General   Post-op Pain Management: Regional block* and Tylenol PO (pre-op)*   Induction: Intravenous  PONV Risk Score and Plan: 3 and Ondansetron, Dexamethasone and Treatment may vary due to age or medical condition  Airway Management Planned: Oral ETT  Additional Equipment:   Intra-op Plan:   Post-operative Plan: Extubation in OR  Informed Consent: I have reviewed the patients History and Physical, chart, labs and discussed the procedure including the risks, benefits and alternatives for the proposed anesthesia with the patient or authorized representative who has indicated his/her understanding and acceptance.     Dental advisory given  Plan Discussed with: CRNA  Anesthesia Plan Comments: (See PAT note 08/06/2023)        Anesthesia Quick Evaluation

## 2023-08-07 ENCOUNTER — Ambulatory Visit (HOSPITAL_BASED_OUTPATIENT_CLINIC_OR_DEPARTMENT_OTHER): Payer: Self-pay

## 2023-08-07 ENCOUNTER — Ambulatory Visit (HOSPITAL_COMMUNITY)
Admission: RE | Admit: 2023-08-07 | Discharge: 2023-08-07 | Disposition: A | Discharge status: Home / Self Care | Source: Ambulatory Visit | Attending: Orthopedic Surgery | Admitting: Orthopedic Surgery

## 2023-08-07 ENCOUNTER — Encounter (HOSPITAL_COMMUNITY)
Admission: RE | Disposition: A | Discharge status: Home / Self Care | Payer: Self-pay | Source: Ambulatory Visit | Attending: Orthopedic Surgery

## 2023-08-07 ENCOUNTER — Encounter (HOSPITAL_COMMUNITY): Payer: Self-pay | Admitting: Orthopedic Surgery

## 2023-08-07 ENCOUNTER — Ambulatory Visit (HOSPITAL_COMMUNITY): Payer: Self-pay | Admitting: Physician Assistant

## 2023-08-07 DIAGNOSIS — I10 Essential (primary) hypertension: Secondary | ICD-10-CM | POA: Insufficient documentation

## 2023-08-07 DIAGNOSIS — G8918 Other acute postprocedural pain: Secondary | ICD-10-CM | POA: Diagnosis not present

## 2023-08-07 DIAGNOSIS — M7032 Other bursitis of elbow, left elbow: Secondary | ICD-10-CM | POA: Diagnosis not present

## 2023-08-07 DIAGNOSIS — F319 Bipolar disorder, unspecified: Secondary | ICD-10-CM | POA: Insufficient documentation

## 2023-08-07 DIAGNOSIS — Z950 Presence of cardiac pacemaker: Secondary | ICD-10-CM | POA: Insufficient documentation

## 2023-08-07 DIAGNOSIS — Z79899 Other long term (current) drug therapy: Secondary | ICD-10-CM | POA: Insufficient documentation

## 2023-08-07 DIAGNOSIS — M71122 Other infective bursitis, left elbow: Secondary | ICD-10-CM | POA: Diagnosis not present

## 2023-08-07 DIAGNOSIS — F32A Depression, unspecified: Secondary | ICD-10-CM | POA: Diagnosis not present

## 2023-08-07 DIAGNOSIS — Z87891 Personal history of nicotine dependence: Secondary | ICD-10-CM

## 2023-08-07 DIAGNOSIS — E785 Hyperlipidemia, unspecified: Secondary | ICD-10-CM | POA: Diagnosis not present

## 2023-08-07 HISTORY — PX: OLECRANON BURSECTOMY: SHX2097

## 2023-08-07 SURGERY — BURSECTOMY, ELBOW
Anesthesia: General | Site: Elbow | Laterality: Left

## 2023-08-07 MED ORDER — FENTANYL CITRATE PF 50 MCG/ML IJ SOSY
50.0000 ug | PREFILLED_SYRINGE | INTRAMUSCULAR | Status: DC
  Administered 2023-08-07: 50 ug via INTRAVENOUS
  Filled 2023-08-07: qty 2

## 2023-08-07 MED ORDER — EPHEDRINE 5 MG/ML INJ
INTRAVENOUS | Status: AC
  Filled 2023-08-07: qty 10

## 2023-08-07 MED ORDER — EPHEDRINE SULFATE-NACL 50-0.9 MG/10ML-% IV SOSY
PREFILLED_SYRINGE | INTRAVENOUS | Status: DC | PRN
  Administered 2023-08-07 (×2): 10 mg via INTRAVENOUS
  Administered 2023-08-07: 15 mg via INTRAVENOUS

## 2023-08-07 MED ORDER — VANCOMYCIN HCL 1000 MG IV SOLR
INTRAVENOUS | Status: AC
  Filled 2023-08-07: qty 20

## 2023-08-07 MED ORDER — DEXAMETHASONE SODIUM PHOSPHATE 10 MG/ML IJ SOLN
INTRAMUSCULAR | Status: DC | PRN
  Administered 2023-08-07: 4 mg via INTRAVENOUS

## 2023-08-07 MED ORDER — ACETAMINOPHEN 500 MG PO TABS
1000.0000 mg | ORAL_TABLET | Freq: Once | ORAL | Status: AC
  Administered 2023-08-07: 1000 mg via ORAL
  Filled 2023-08-07: qty 2

## 2023-08-07 MED ORDER — CEFAZOLIN SODIUM-DEXTROSE 2-4 GM/100ML-% IV SOLN
2.0000 g | INTRAVENOUS | Status: AC
  Administered 2023-08-07: 2 g via INTRAVENOUS
  Filled 2023-08-07: qty 100

## 2023-08-07 MED ORDER — SODIUM CHLORIDE 0.9 % IR SOLN
Status: DC | PRN
  Administered 2023-08-07: 1000 mL

## 2023-08-07 MED ORDER — BUPIVACAINE-EPINEPHRINE (PF) 0.5% -1:200000 IJ SOLN
INTRAMUSCULAR | Status: DC | PRN
Start: 2023-08-07 — End: 2023-08-07
  Administered 2023-08-07: 30 mL via PERINEURAL

## 2023-08-07 MED ORDER — EPHEDRINE SULFATE (PRESSORS) 50 MG/ML IJ SOLN: INTRAMUSCULAR | Status: DC | PRN

## 2023-08-07 MED ORDER — MIDAZOLAM HCL 2 MG/2ML IJ SOLN
1.0000 mg | INTRAMUSCULAR | Status: DC
  Administered 2023-08-07: 1 mg via INTRAVENOUS

## 2023-08-07 MED ORDER — POVIDONE-IODINE 10 % EX SWAB
2.0000 | Freq: Once | CUTANEOUS | Status: DC
Start: 2023-08-07 — End: 2023-08-07

## 2023-08-07 MED ORDER — ONDANSETRON HCL 4 MG/2ML IJ SOLN
INTRAMUSCULAR | Status: DC | PRN
  Administered 2023-08-07: 4 mg via INTRAVENOUS

## 2023-08-07 MED ORDER — PROPOFOL 10 MG/ML IV BOLUS
INTRAVENOUS | Status: DC | PRN
  Administered 2023-08-07: 120 mg via INTRAVENOUS

## 2023-08-07 MED ORDER — VANCOMYCIN HCL IN DEXTROSE 1-5 GM/200ML-% IV SOLN
1000.0000 mg | INTRAVENOUS | Status: AC
Start: 2023-08-07 — End: 2023-08-07
  Administered 2023-08-07: 1000 mg via INTRAVENOUS
  Filled 2023-08-07: qty 200

## 2023-08-07 MED ORDER — LACTATED RINGERS IV SOLN: INTRAVENOUS | Status: DC

## 2023-08-07 MED ORDER — CHLORHEXIDINE GLUCONATE 0.12 % MT SOLN
15.0000 mL | Freq: Once | OROMUCOSAL | Status: AC
  Administered 2023-08-07: 15 mL via OROMUCOSAL

## 2023-08-07 MED ORDER — VANCOMYCIN HCL 1000 MG IV SOLR
INTRAVENOUS | Status: DC | PRN
  Administered 2023-08-07: 1000 mg via TOPICAL

## 2023-08-07 MED ORDER — ORAL CARE MOUTH RINSE: 15.0000 mL | Freq: Once | OROMUCOSAL | Status: AC

## 2023-08-07 MED ORDER — LIDOCAINE HCL (PF) 2 % IJ SOLN
INTRAMUSCULAR | Status: AC
  Filled 2023-08-07: qty 5

## 2023-08-07 MED ORDER — FENTANYL CITRATE (PF) 100 MCG/2ML IJ SOLN
INTRAMUSCULAR | Status: DC | PRN
  Administered 2023-08-07: 50 ug via INTRAVENOUS

## 2023-08-07 MED ORDER — LIDOCAINE HCL (CARDIAC) PF 100 MG/5ML IV SOSY
PREFILLED_SYRINGE | INTRAVENOUS | Status: DC | PRN
  Administered 2023-08-07: 30 mg via INTRAVENOUS

## 2023-08-07 MED ORDER — SUGAMMADEX SODIUM 200 MG/2ML IV SOLN
INTRAVENOUS | Status: DC | PRN
  Administered 2023-08-07: 150 mg via INTRAVENOUS

## 2023-08-07 MED ORDER — ROCURONIUM BROMIDE 100 MG/10ML IV SOLN
INTRAVENOUS | Status: DC | PRN
  Administered 2023-08-07: 45 mg via INTRAVENOUS

## 2023-08-07 MED ORDER — DOXYCYCLINE HYCLATE 50 MG PO CAPS: 100.0000 mg | ORAL_CAPSULE | Freq: Two times a day (BID) | ORAL | 0 refills | Status: AC

## 2023-08-07 MED ORDER — HYDROCODONE-ACETAMINOPHEN 5-325 MG PO TABS: 1.0000 | ORAL_TABLET | ORAL | 0 refills | Status: DC | PRN

## 2023-08-07 MED ORDER — 0.9 % SODIUM CHLORIDE (POUR BTL) OPTIME
TOPICAL | Status: DC | PRN
  Administered 2023-08-07: 1000 mL

## 2023-08-07 MED ORDER — HYDROMORPHONE HCL 1 MG/ML IJ SOLN: 0.2500 mg | INTRAMUSCULAR | Status: DC | PRN

## 2023-08-07 SURGICAL SUPPLY — 33 items
BNDG COHESIVE 4X5 TAN STRL LF (GAUZE/BANDAGES/DRESSINGS) ×2 IMPLANT
BNDG ELASTIC 4INX 5YD STR LF (GAUZE/BANDAGES/DRESSINGS) IMPLANT
BNDG ESMARK 4X9 LF (GAUZE/BANDAGES/DRESSINGS) ×2 IMPLANT
COVER SURGICAL LIGHT HANDLE (MISCELLANEOUS) ×2 IMPLANT
CUFF TOURN SGL QUICK 18X4 (TOURNIQUET CUFF) IMPLANT
DRAPE POUCH INSTRU U-SHP 10X18 (DRAPES) ×2 IMPLANT
DRAPE SURG 17X23 STRL (DRAPES) IMPLANT
DRSG XEROFORM 1X8 (GAUZE/BANDAGES/DRESSINGS) IMPLANT
DURAPREP 26ML APPLICATOR (WOUND CARE) ×2 IMPLANT
ELECT REM PT RETURN 15FT ADLT (MISCELLANEOUS) ×2 IMPLANT
GAUZE PAD ABD 8X10 STRL (GAUZE/BANDAGES/DRESSINGS) IMPLANT
GAUZE SPONGE 4X4 12PLY STRL (GAUZE/BANDAGES/DRESSINGS) IMPLANT
GLOVE BIO SURGEON STRL SZ7 (GLOVE) ×2 IMPLANT
GLOVE BIOGEL PI IND STRL 7.0 (GLOVE) ×2 IMPLANT
GLOVE BIOGEL PI IND STRL 8 (GLOVE) ×2 IMPLANT
GOWN STRL REUS W/ TWL LRG LVL3 (GOWN DISPOSABLE) ×4 IMPLANT
KIT BASIN OR (CUSTOM PROCEDURE TRAY) ×2 IMPLANT
KIT TURNOVER KIT A (KITS) IMPLANT
MANIFOLD NEPTUNE II (INSTRUMENTS) ×2 IMPLANT
NS IRRIG 1000ML POUR BTL (IV SOLUTION) ×2 IMPLANT
PACK ORTHO EXTREMITY (CUSTOM PROCEDURE TRAY) ×2 IMPLANT
PAD CAST 4YDX4 CTTN HI CHSV (CAST SUPPLIES) IMPLANT
PROTECTOR NERVE ULNAR (MISCELLANEOUS) ×2 IMPLANT
SET IRRIG Y TYPE TUR BLADDER L (SET/KITS/TRAYS/PACK) ×2 IMPLANT
SLING ARM FOAM STRAP MED (SOFTGOODS) IMPLANT
SPLINT CAST 1 STEP 5X30 WHT (MISCELLANEOUS) IMPLANT
STOCKINETTE 8 INCH (MISCELLANEOUS) IMPLANT
SUCTION TUBE FRAZIER 12FR DISP (SUCTIONS) IMPLANT
SUT NYLON 3 0 (SUTURE) ×4 IMPLANT
SWAB COLLECTION DEVICE MRSA (MISCELLANEOUS) IMPLANT
SWAB CULTURE ESWAB REG 1ML (MISCELLANEOUS) IMPLANT
TOWEL OR 17X26 10 PK STRL BLUE (TOWEL DISPOSABLE) ×2 IMPLANT
TUBING CONNECTING 10 (TUBING) IMPLANT

## 2023-08-07 NOTE — Op Note (Signed)
 08/07/2023  11:52 AM  PATIENT:  Anita Diaz    PRE-OPERATIVE DIAGNOSIS:  Septic bursitis of elbow, left  POST-OPERATIVE DIAGNOSIS:  Same  PROCEDURE:  BURSECTOMY, ELBOW  SURGEON:  Neville Barbone, MD  PHYSICIAN ASSISTANT: Hurshel Maidens, PA-C, present and scrubbed throughout the case, critical for completion in a timely fashion, and for retraction, instrumentation, and closure.  ANESTHESIA:   General  PREOPERATIVE INDICATIONS:  [name] is a  68 y.o. female with a diagnosis of Septic bursitis of elbow, left who failed conservative measures and elected for surgical management.    The risks benefits and alternatives were discussed with the patient preoperatively including but not limited to the risks of infection, bleeding, nerve injury, cardiopulmonary complications, the need for revision surgery, among others, and the patient was willing to proceed.  ESTIMATED BLOOD LOSS: Minimal  OPERATIVE IMPLANTS:   * No implants in log *  OPERATIVE FINDINGS: Boggy olecranon bursa with turbid fluid  OPERATIVE PROCEDURE: The patient was brought to the operating room and placed in supine position.  General anesthesia was administered.  She received IV vancomycin and Ancef.  She was turned into the lateral decubitus position, and the left upper extremity was prepped and draped in usual sterile fashion the arm was placed over an arm holder.  Timeout performed and posterior incision was made over the olecranon bursa, and sharp dissection was made elevating the flaps superficially.  Blunt dissection was then carried out along the direction of the ulnar nerve taking care to protect the nerve throughout the case.  The bursa was elevated, and excised in entirety.  The triceps tendon was preserved and remained intact and the sheath overlying the cubital tunnel was also preserved.  I performed a complete bursectomy and sent this for Gram stain culture and sensitivity.  The wounds were irrigated copiously,  and the olecranon tip was debrided bluntly with a rongeur, as well as a Cobb curette, a liter of fluid was irrigated through the wound, vancomycin powder was placed, followed by a loose closure with nylon and sterile gauze.  She was placed in a posterior splint, and was awakened and returned to the PACU in stable and satisfactory condition.  There were no complications and she tolerated the procedure well.

## 2023-08-07 NOTE — Discharge Instructions (Signed)
 Diet: As you were doing prior to hospitalization   Shower/Dressing:  May shower but keep the wounds dry, use an occlusive plastic wrap, NO SOAKING IN TUB. Leave the splint in place and keep the splint dry with a plastic bag. We will change your bandages during your first follow-up appointment.  We will plan to remove your stitches in about 2-3 weeks in the office.   Activity:  Increase activity slowly as tolerated, but follow the weight bearing instructions below.  The rules on driving is that you can not be taking narcotics while you drive, and you must feel in control of the vehicle.    Weight Bearing:  No bearing weight with left arm  To prevent constipation: you may use a stool softener such as -  Colace (over the counter) 100 mg by mouth twice a day  Drink plenty of fluids (prune juice may be helpful) and high fiber foods Miralax (over the counter) for constipation as needed.    Itching:  If you experience itching with your medications, try taking only a single pain pill, or even half a pain pill at a time.  You may take up to 10 pain pills per day, and you can also use benadryl over the counter for itching or also to help with sleep.   Precautions:  If you experience chest pain or shortness of breath - call 911 immediately for transfer to the hospital emergency department!!  If you develop a fever greater that 101 F, purulent drainage from wound, increased redness or drainage from wound, or calf pain -- Call the office at [phone]                                                Follow- Up Appointment:  Please call for an appointment to be seen in 2 weeks [place] - [phone]

## 2023-08-07 NOTE — Anesthesia Postprocedure Evaluation (Signed)
 Anesthesia Post Note  Patient: Anita Diaz  Procedure(s) Performed: BURSECTOMY, ELBOW (Left: Elbow)     Patient location during evaluation: PACU Anesthesia Type: General and Regional Level of consciousness: awake and alert Pain management: pain level controlled Vital Signs Assessment: post-procedure vital signs reviewed and stable Respiratory status: spontaneous breathing, nonlabored ventilation and respiratory function stable Cardiovascular status: blood pressure returned to baseline and stable Postop Assessment: no apparent nausea or vomiting Anesthetic complications: no  No notable events documented.  Last Vitals:  Vitals:   08/07/23 1315 08/07/23 1345  BP: (!) 148/80 (!) 143/75  Pulse: 70 65  Resp: 20 17  Temp:  36.8 C  SpO2: 93% 93%    Last Pain:  Vitals:   08/07/23 1345  TempSrc:   PainSc: 0-No pain                 Naziya Hegwood,W. EDMOND

## 2023-08-07 NOTE — Anesthesia Procedure Notes (Signed)
 Anesthesia Regional Block: Supraclavicular block   Pre-Anesthetic Checklist: , timeout performed,  Correct Patient, Correct Site, Correct Laterality,  Correct Procedure, Correct Position, site marked,  Risks and benefits discussed,  Pre-op evaluation,  At surgeon's request and post-op pain management  Laterality: Left  Prep: Maximum Sterile Barrier Precautions used, chloraprep       Needles:  Injection technique: Single-shot  Needle Type: Echogenic Stimulator Needle     Needle Length: 5cm  Needle Gauge: 22     Additional Needles:   Procedures:,,,, ultrasound used (permanent image in chart),,    Narrative:  Start time: 08/07/2023 10:28 AM End time: 08/07/2023 10:38 AM Injection made incrementally with aspirations every 5 mL.  Performed by: Personally  Anesthesiologist: Jake Mayers, MD

## 2023-08-07 NOTE — Anesthesia Procedure Notes (Signed)
 Procedure Name: Intubation Date/Time: 08/07/2023 11:10 AM  Performed by: Mervyn Ace, CRNAPre-anesthesia Checklist: Patient identified, Emergency Drugs available, Suction available, Patient being monitored and Timeout performed Patient Re-evaluated:Patient Re-evaluated prior to induction Oxygen Delivery Method: Circle system utilized Preoxygenation: Pre-oxygenation with 100% oxygen Induction Type: IV induction Ventilation: Mask ventilation without difficulty Laryngoscope Size: Mac and 3 Grade View: Grade I Tube type: Oral Tube size: 7.0 mm Number of attempts: 1 Airway Equipment and Method: Stylet Placement Confirmation: ETT inserted through vocal cords under direct vision, positive ETCO2, CO2 detector and breath sounds checked- equal and bilateral Secured at: 22 cm Tube secured with: secured with pink Hy-tape. Dental Injury: Teeth and Oropharynx as per pre-operative assessment

## 2023-08-07 NOTE — Interval H&P Note (Signed)
 History and Physical Interval Note:  08/07/2023 10:44 AM  Anita Diaz  has presented today for surgery, with the diagnosis of Septic bursitis of elbow, left.  The various methods of treatment have been discussed with the patient and family. After consideration of risks, benefits and other options for treatment, the patient has consented to  Procedure(s): BURSECTOMY, ELBOW (Left) as a surgical intervention.  The patient's history has been reviewed, patient examined, no change in status, stable for surgery.  I have reviewed the patient's chart and labs.  Questions were answered to the patient's satisfaction.     Neville Barbone

## 2023-08-07 NOTE — Transfer of Care (Signed)
 Immediate Anesthesia Transfer of Care Note  Patient: Anita Diaz  Procedure(s) Performed: BURSECTOMY, ELBOW (Left: Elbow)  Patient Location: PACU  Anesthesia Type:General  Level of Consciousness: awake, alert , oriented, and patient cooperative  Airway & Oxygen Therapy: Patient Spontanous Breathing and Patient connected to face mask oxygen  Post-op Assessment: Report given to RN and Post -op Vital signs reviewed and stable  Post vital signs: Reviewed and stable  Last Vitals:  Vitals Value Taken Time  BP 123/110 08/07/23 1243  Temp    Pulse 61 08/07/23 1248  Resp 15 08/07/23 1248  SpO2 93 % 08/07/23 1248  Vitals shown include unfiled device data.  Last Pain:  Vitals:   08/07/23 1009  TempSrc: Oral  PainSc: 3       Patients Stated Pain Goal: 2 (08/07/23 1009)  Complications: No notable events documented.

## 2023-08-08 ENCOUNTER — Encounter (HOSPITAL_COMMUNITY): Payer: Self-pay | Admitting: Orthopedic Surgery

## 2023-08-08 ENCOUNTER — Telehealth: Payer: Self-pay | Admitting: Pharmacy Technician

## 2023-08-08 MED ORDER — SACUBITRIL-VALSARTAN 24-26 MG PO TABS: 1.0000 | ORAL_TABLET | Freq: Two times a day (BID) | ORAL | 0 refills | Status: DC

## 2023-08-08 NOTE — Telephone Encounter (Signed)
 Hi! The patient called and said she got a message from [employer] saying we need to call in a refill. I called [employer] to verify and they said to call 9am our time to [phone] and hit the prescriber option then ask to talk to the pharmacy to call in a refill. This will be for her entresto 24-26mg  prescription. Thank you!

## 2023-08-08 NOTE — Telephone Encounter (Signed)
 Pt's medication was not sent to a pharmacy yet. Pt's medication was sent to [facility]. Confirmation received FYI

## 2023-08-08 NOTE — Addendum Note (Signed)
 Addended by: Gayleen Kawasaki D on: 08/08/2023 08:56 AM   Modules accepted: Orders

## 2023-08-09 ENCOUNTER — Telehealth: Payer: Self-pay | Admitting: Cardiology

## 2023-08-09 NOTE — Telephone Encounter (Signed)
*  STAT* If patient is at the pharmacy, call can be transferred to refill team.   1. Which medications need to be refilled? (please list name of each medication and dose if known) need a prescription for her Entresto    2. Would you like to learn more about the convenience, safety, & potential cost savings by using the [facility]?      3. Are you open to using the [facility] (Type [facility]. .   4. Which pharmacy/location (including street and city if local pharmacy) is medication to be sent to [facility]     they need a 30 day or 90 day supply? 90 days and refills

## 2023-08-12 LAB — AEROBIC/ANAEROBIC CULTURE W GRAM STAIN (SURGICAL/DEEP WOUND)
Culture: NO GROWTH
Gram Stain: NONE SEEN

## 2023-08-14 NOTE — Telephone Encounter (Signed)
 The patient called and said she has not received her medication. I called [employer] and they said they send out the prescription today for tomorrow delivery. The prescription was only for 90 days with 0 refills until she comes in July. I called the patient to make her aware and she said she can come in any time if she can be put on the cancellation list and she can come in sooner so she doesn't run out before her appt on 11/13/23. Thank you!

## 2023-08-16 ENCOUNTER — Ambulatory Visit: Attending: Cardiology | Admitting: Cardiology

## 2023-08-16 ENCOUNTER — Encounter: Payer: Self-pay | Admitting: Cardiology

## 2023-08-16 VITALS — BP 122/80 | HR 57 | Ht 62.0 in | Wt 138.0 lb

## 2023-08-16 DIAGNOSIS — I5022 Chronic systolic (congestive) heart failure: Secondary | ICD-10-CM

## 2023-08-16 DIAGNOSIS — I471 Supraventricular tachycardia, unspecified: Secondary | ICD-10-CM

## 2023-08-16 DIAGNOSIS — I428 Other cardiomyopathies: Secondary | ICD-10-CM | POA: Diagnosis not present

## 2023-08-16 DIAGNOSIS — I447 Left bundle-branch block, unspecified: Secondary | ICD-10-CM | POA: Diagnosis not present

## 2023-08-16 MED ORDER — EZETIMIBE 10 MG PO TABS
10.0000 mg | ORAL_TABLET | Freq: Every day | ORAL | 3 refills | Status: AC
Start: 2023-08-16 — End: ?

## 2023-08-16 MED ORDER — SACUBITRIL-VALSARTAN 24-26 MG PO TABS: 1.0000 | ORAL_TABLET | Freq: Two times a day (BID) | ORAL | 3 refills | Status: AC

## 2023-08-16 MED ORDER — FUROSEMIDE 20 MG PO TABS
20.0000 mg | ORAL_TABLET | Freq: Every day | ORAL | 3 refills | Status: AC
Start: 2023-08-16 — End: ?

## 2023-08-16 MED ORDER — CARVEDILOL 6.25 MG PO TABS: 6.2500 mg | ORAL_TABLET | Freq: Two times a day (BID) | ORAL | 3 refills | Status: AC

## 2023-08-16 NOTE — Patient Instructions (Signed)

## 2023-08-16 NOTE — Progress Notes (Signed)
 Cardiology Office Note:  .   Date:  08/16/2023  ID:  Anita Diaz, DOB 16-Aug-1955, MRN [id number] PCP: Rodney Clamp, MD  [facility] Providers Cardiologist:  Dorothye Gathers, MD     History of Present Illness: .   Anita Diaz is a 68 y.o. female Discussed the use of AI scribe software for clinical note transcription with the patient, who gave verbal consent to proceed.  History of Present Illness Anita Diaz is a 68 year old female with left bundle branch block and non-ischemic cardiomyopathy who presents for follow-up.  She has a history of left bundle branch block and non-ischemic cardiomyopathy, managed with a biventricular ICD placed in 2019. Her initial echocardiogram showed an ejection fraction of 25-30%, which improved to 45-50% as of her last echocardiogram in 2022. She has no issues with the defibrillator, and it has not fired. She experiences increased shortness of breath, particularly when walking or climbing stairs. Her current medications include carvedilol  6.25 mg twice daily, Entresto  24/26 mg twice daily, and furosemide  20 mg daily.  In September 2022, she experienced a stroke confirmed by MRI. She is currently on clopidogrel  75 mg daily for stroke prevention with no bleeding issues.  She underwent rotator cuff surgery ten weeks ago and subsequently developed a staph infection in her elbow, requiring surgical intervention. This has impacted her ability to attend physical therapy for her shoulder for the past three weeks.  She mentions a tingling sensation in her toes, affecting some of her toes. She was previously on [name]  10 mg but discontinued it due to headaches and dizziness.     Studies Reviewed: .        Results LABS LDL: 65 (05/2023) Hb: 11.2 (07/2023)  RADIOLOGY MRI: Stroke (12/2020)  DIAGNOSTIC Echocardiogram: EF 45-50% (2022) EKG: Atrial sensed ventricular paced rhythm, normal sinus rhythm, heart rate 60 (08/06/2023) Risk  Assessment/Calculations:            Physical Exam:   VS:  BP 122/80   Pulse (!) 57   Ht 5\' 2"  (1.575 m)   Wt 138 lb (62.6 kg)   SpO2 96%   BMI 25.24 kg/m    Wt Readings from Last 3 Encounters:  08/16/23 138 lb (62.6 kg)  08/07/23 134 lb 15.7 oz (61.2 kg)  08/06/23 135 lb (61.2 kg)    GEN: Well nourished, well developed in no acute distress NECK: No JVD; No carotid bruits CARDIAC: RRR, no murmurs, no rubs, no gallops RESPIRATORY:  Clear to auscultation without rales, wheezing or rhonchi  ABDOMEN: Soft, non-tender, non-distended EXTREMITIES:  No edema; No deformity, left arm wrapped  ASSESSMENT AND PLAN: .    Assessment and Plan Assessment & Plan Non-ischemic cardiomyopathy Non-ischemic cardiomyopathy with improved ejection fraction from initial 25-30% to current 45-50%, indicating mild reduction in pump function. Biventricular ICD aiding in coordination of cardiac efforts. Reports increased dyspnea, especially with exertion, such as climbing stairs. Discontinued [name]  due to headaches and dizziness. - Continue carvedilol  6.25 mg twice daily - Continue Entresto  24/26 mg twice daily [employer] program helping with supply) - Continue furosemide  20 mg daily - Discontinue [name]  due to adverse effects (she stopped on her own)  Status post biventricular ICD placement Biventricular ICD placed in 2019, functioning well without any recent discharges or complications.  Atrial sensed ventricular paced rhythm EKG shows atrial sensed ventricular paced rhythm with normal sinus rhythm and heart rate of 60 bpm, indicating well-managed cardiac rhythm.  Stroke Stroke confirmed by MRI  in September 2022. Currently on clopidogrel  for stroke prevention. No signs of bleeding or new neurological deficits. - Continue clopidogrel  75 mg daily  Tingling in toes, possible neuropathy Reports tingling sensation in toes, possibly indicative of neuropathy. Good peripheral pulses noted, suggesting  no vascular insufficiency. Advised to consult with primary care provider for further evaluation. - Consult with primary care provider regarding neuropathy symptoms  Staph infection post rotator cuff surgery Recent staph infection in elbow following rotator cuff surgery, requiring surgical intervention and drainage. Currently recovering from infection.         Signed, Dorothye Gathers, MD

## 2023-08-22 ENCOUNTER — Other Ambulatory Visit: Payer: Self-pay | Admitting: Family Medicine

## 2023-08-22 DIAGNOSIS — M19012 Primary osteoarthritis, left shoulder: Secondary | ICD-10-CM | POA: Diagnosis not present

## 2023-08-22 DIAGNOSIS — M71122 Other infective bursitis, left elbow: Secondary | ICD-10-CM | POA: Diagnosis not present

## 2023-08-27 ENCOUNTER — Other Ambulatory Visit: Payer: Self-pay | Admitting: Family Medicine

## 2023-08-29 DIAGNOSIS — M25612 Stiffness of left shoulder, not elsewhere classified: Secondary | ICD-10-CM | POA: Diagnosis not present

## 2023-08-29 DIAGNOSIS — M6281 Muscle weakness (generalized): Secondary | ICD-10-CM | POA: Diagnosis not present

## 2023-08-29 DIAGNOSIS — M19012 Primary osteoarthritis, left shoulder: Secondary | ICD-10-CM | POA: Diagnosis not present

## 2023-09-03 ENCOUNTER — Telehealth: Payer: Self-pay | Admitting: Family Medicine

## 2023-09-03 NOTE — Telephone Encounter (Unsigned)
 Copied from CRM [id number]. Topic: Referral - Question >> Sep 03, 2023 12:34 PM Dyann Glaser G wrote: Reason for CRM: PT IS WANTING ANOTHER EYE PLACE THAT CAN GET HER IN EARLIER THAN NOVEMBER.

## 2023-09-04 ENCOUNTER — Ambulatory Visit (INDEPENDENT_AMBULATORY_CARE_PROVIDER_SITE_OTHER): Admitting: Family Medicine

## 2023-09-04 ENCOUNTER — Encounter: Payer: Self-pay | Admitting: Family Medicine

## 2023-09-04 VITALS — BP 94/67 | HR 78 | Temp 97.9°F | Ht 62.0 in | Wt 139.8 lb

## 2023-09-04 DIAGNOSIS — E538 Deficiency of other specified B group vitamins: Secondary | ICD-10-CM | POA: Diagnosis not present

## 2023-09-04 DIAGNOSIS — R519 Headache, unspecified: Secondary | ICD-10-CM

## 2023-09-04 DIAGNOSIS — K219 Gastro-esophageal reflux disease without esophagitis: Secondary | ICD-10-CM | POA: Insufficient documentation

## 2023-09-04 DIAGNOSIS — R202 Paresthesia of skin: Secondary | ICD-10-CM

## 2023-09-04 LAB — COMPREHENSIVE METABOLIC PANEL WITH GFR
ALT: 21 U/L (ref 0–35)
AST: 18 U/L (ref 0–37)
Albumin: 4.5 g/dL (ref 3.5–5.2)
Alkaline Phosphatase: 92 U/L (ref 39–117)
BUN: 19 mg/dL (ref 6–23)
CO2: 31 meq/L (ref 19–32)
Calcium: 9.4 mg/dL (ref 8.4–10.5)
Chloride: 101 meq/L (ref 96–112)
Creatinine, Ser: 0.95 mg/dL (ref 0.40–1.20)
GFR: 62.07 mL/min (ref >60.00)
Glucose, Bld: 100 mg/dL — ABNORMAL HIGH (ref 70–99)
Potassium: 4.3 meq/L (ref 3.5–5.1)
Sodium: 139 meq/L (ref 135–145)
Total Bilirubin: 0.4 mg/dL (ref 0.2–1.2)
Total Protein: 7.3 g/dL (ref 6.0–8.3)

## 2023-09-04 LAB — CBC
HCT: 38 % (ref 36.0–46.0)
Hemoglobin: 12.6 g/dL (ref 12.0–15.0)
MCHC: 33.1 g/dL (ref 30.0–36.0)
MCV: 85.2 fl (ref 78.0–100.0)
Platelets: 390 10*3/uL (ref 150.0–400.0)
RBC: 4.46 Mil/uL (ref 3.87–5.11)
RDW: 15 % (ref 11.5–15.5)
WBC: 6 10*3/uL (ref 4.0–10.5)

## 2023-09-04 LAB — HEMOGLOBIN A1C: Hgb A1c MFr Bld: 6.1 % (ref 4.6–6.5)

## 2023-09-04 LAB — VITAMIN B12: Vitamin B-12: 404 pg/mL (ref 211–911)

## 2023-09-04 LAB — TSH: TSH: 1.56 u[IU]/mL (ref 0.35–5.50)

## 2023-09-04 MED ORDER — PANTOPRAZOLE SODIUM 40 MG PO TBEC: 40.0000 mg | DELAYED_RELEASE_TABLET | Freq: Every day | ORAL | 3 refills | Status: AC

## 2023-09-04 NOTE — Assessment & Plan Note (Addendum)
 Stable on Protonix  40 mg daily.  Will refill today. Check B12

## 2023-09-04 NOTE — Patient Instructions (Signed)
 It was very nice to see you today!  We will check blood work and refer you to see the neurologist.  I will refill your Protonix  today.  Return if symptoms worsen or fail to improve.   Take care, Dr Daneil Dunker  PLEASE NOTE:  If you had any lab tests, please let us  know if you have not heard back within a few days. You may see your results on mychart before we have a chance to review them but we will give you a call once they are reviewed by us .   If we ordered any referrals today, please let us  know if you have not heard from their office within the next week.   If you had any urgent prescriptions sent in today, please check with the pharmacy within an hour of our visit to make sure the prescription was transmitted appropriately.   Please try these tips to maintain a healthy lifestyle:  Eat at least 3 REAL meals and 1-2 snacks per day.  Aim for no more than 5 hours between eating.  If you eat breakfast, please do so within one hour of getting up.   Each meal should contain half fruits/vegetables, one quarter protein, and one quarter carbs (no bigger than a computer mouse)  Cut down on sweet beverages. This includes juice, soda, and sweet tea.   Drink at least 1 glass of water  with each meal and aim for at least 8 glasses per day  Exercise at least 150 minutes every week.

## 2023-09-04 NOTE — Progress Notes (Signed)
   Anita Diaz is a 68 y.o. female who presents today for an office visit.  Assessment/Plan:  New/Acute Problems: Paresthesias Overall reassuring neurologic exam.  No red flags.  Has history of low B12.  Will recheck this today.  Also check c-Met, A1c, and TSH.  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  Depending on results of the labs we will likely have her follow back up with orthopedics to further evaluate lumbar radiculopathy.  We discussed reasons to return to care.  Foot pain Overall symptoms are mild and manageable.  Likely does have some degenerative changes in her foot.  Also loss of transverse arch noted as well and may have some component of metatarsalgia.  We did discuss further evaluation including imaging and referral however she like to hold off on this for now until she can address above issue.  She will let us  know if she needs referral or she can also discuss with her orthopedic.  Chronic Problems Addressed Today: Nonintractable headache She has not had any significant change in symptoms since our last visit.  Overall reassuring exam today.  We did order an MRI several months ago which demonstrated empty sella turcica.  We recommended referral to neurology at that time however she declined.  She is now interested in referral.  Will place referral today.  B12 deficiency Check B12.  Likely contributing to above paresthesias.  GERD (gastroesophageal reflux disease) Stable on Protonix  40 mg daily.  Will refill today. Check B12     Subjective:  HPI:  See A/P for status of chronic conditions.  Patient is here today with paresthesias in the left foot.  This has been going on for many months.  Symptoms have been stable over that time.  She is also getting numbness and tingling in hands as  well.  Symptoms come and go.  Worse with certain positions.  She has not noticed any obvious injuries or precipitating events.  She has tried taking Tylenol  which does help.  She still has ongoing issues with headache and vertigo but this has been stable over the last several months as well.  Since our last visit she has had a few orthopedic issues including recent left shoulder replacement and left olecranon septic bursitis.  She has additionally had some pain in her right foot as well.  No reported bowel or bladder incontinence.  No reported urinary retention.      Objective:  Physical Exam: BP 94/67   Pulse 78   Temp 97.9 F (36.6 C) (Temporal)   Ht 5\' 2"  (1.575 m)   Wt 139 lb 12.8 oz (63.4 kg)   SpO2 98%   BMI 25.57 kg/m   Gen: No acute distress, resting comfortably MUSCULOSKELETAL: - Feet: Loss of transverse arch noted bilaterally.  Neurovascular intact distally.  Good distal cap refill.  Tinel sign negative at malleoli bilaterally. Neuro: CN2-12 intact.  Sensation light touch intact throughout.  Strength 5 out of 5 throughout. Psych: Normal affect and thought content      Larae Caison M. Daneil Dunker, MD 09/04/2023 8:30 AM

## 2023-09-04 NOTE — Assessment & Plan Note (Signed)
 Check B12.  Likely contributing to above paresthesias.

## 2023-09-04 NOTE — Assessment & Plan Note (Signed)
 She has not had any significant change in symptoms since our last visit.  Overall reassuring exam today.  We did order an MRI several months ago which demonstrated empty sella turcica.  We recommended referral to neurology at that time however she declined.  She is now interested in referral.  Will place referral today.

## 2023-09-05 ENCOUNTER — Ambulatory Visit: Payer: Self-pay | Admitting: Family Medicine

## 2023-09-05 DIAGNOSIS — M25612 Stiffness of left shoulder, not elsewhere classified: Secondary | ICD-10-CM | POA: Diagnosis not present

## 2023-09-05 DIAGNOSIS — M19012 Primary osteoarthritis, left shoulder: Secondary | ICD-10-CM | POA: Diagnosis not present

## 2023-09-05 DIAGNOSIS — M6281 Muscle weakness (generalized): Secondary | ICD-10-CM | POA: Diagnosis not present

## 2023-09-05 NOTE — Progress Notes (Signed)
 Her labs are all at goal.  No obvious cause for her numbness in her foot.  Her B12 level is at goal and her A1c is stable.  It is possible that her symptoms could be coming from a pinched nerve in her back.  Recommend she follow-up with orthopedics soon to discuss.  Please place referral if needed.

## 2023-09-10 ENCOUNTER — Telehealth: Payer: Self-pay | Admitting: *Deleted

## 2023-09-10 NOTE — Telephone Encounter (Addendum)
 Copied from CRM [id number]. Topic: Referral - Status >> Sep 06, 2023  3:34 PM Albertha Alosa wrote: Reason for CRM: Patient called in regarding eye referral , stating the office can't get her in until October, she would like to be seen sooner than that. Would like for the referral dept to give her a callback regarding this   Left message to return call to our office at their convenience.  Horald Lyme  Spoke with patient, patient stated will call [facility] office tomorrow for an earlier appt  [facility]

## 2023-09-13 ENCOUNTER — Ambulatory Visit (INDEPENDENT_AMBULATORY_CARE_PROVIDER_SITE_OTHER): Payer: Medicare Other

## 2023-09-13 DIAGNOSIS — I428 Other cardiomyopathies: Secondary | ICD-10-CM

## 2023-09-13 LAB — CUP PACEART REMOTE DEVICE CHECK
Battery Remaining Longevity: 78 mo
Battery Remaining Percentage: 92 %
Brady Statistic RA Percent Paced: 1 %
Brady Statistic RV Percent Paced: 2 %
Date Time Interrogation Session: 20250522044000
HighPow Impedance: 66 Ohm
Lead Channel Impedance Value: 425 Ohm
Lead Channel Impedance Value: 439 Ohm
Lead Channel Impedance Value: 525 Ohm
Lead Channel Setting Pacing Amplitude: 2 V
Lead Channel Setting Pacing Amplitude: 2.4 V
Lead Channel Setting Pacing Amplitude: 2.6 V
Lead Channel Setting Pacing Pulse Width: 0.4 ms
Lead Channel Setting Pacing Pulse Width: 0.4 ms
Lead Channel Setting Sensing Sensitivity: 0.5 mV
Lead Channel Setting Sensing Sensitivity: 1 mV
Pulse Gen Serial Number: 215046
Zone Setting Status: 755011

## 2023-09-16 ENCOUNTER — Ambulatory Visit: Payer: Self-pay | Admitting: Internal Medicine

## 2023-09-21 ENCOUNTER — Other Ambulatory Visit: Payer: Self-pay | Admitting: Family Medicine

## 2023-09-26 ENCOUNTER — Other Ambulatory Visit: Payer: Self-pay | Admitting: Family Medicine

## 2023-09-26 DIAGNOSIS — M19012 Primary osteoarthritis, left shoulder: Secondary | ICD-10-CM | POA: Diagnosis not present

## 2023-10-10 ENCOUNTER — Other Ambulatory Visit: Payer: Self-pay | Admitting: Family Medicine

## 2023-10-21 ENCOUNTER — Other Ambulatory Visit: Payer: Self-pay | Admitting: Family Medicine

## 2023-10-31 NOTE — Progress Notes (Signed)
 Remote ICD transmission.

## 2023-11-01 DIAGNOSIS — H25013 Cortical age-related cataract, bilateral: Secondary | ICD-10-CM | POA: Diagnosis not present

## 2023-11-01 DIAGNOSIS — H16223 Keratoconjunctivitis sicca, not specified as Sjogren's, bilateral: Secondary | ICD-10-CM | POA: Diagnosis not present

## 2023-11-01 DIAGNOSIS — H2513 Age-related nuclear cataract, bilateral: Secondary | ICD-10-CM | POA: Diagnosis not present

## 2023-11-01 DIAGNOSIS — H43813 Vitreous degeneration, bilateral: Secondary | ICD-10-CM | POA: Diagnosis not present

## 2023-11-05 ENCOUNTER — Telehealth: Payer: Self-pay | Admitting: Pharmacy Technician

## 2023-11-05 NOTE — Telephone Encounter (Signed)
 Hi, [employer] is asking for another prescription for Entresto . She had one written on 08/16/23 for 90 days with 3 refills but then someone cancelled it on 09/04/23 saying patient not taking but she has/is taking it. The prescription would need to go to [facility] pharmacy for [employer] please and thank you!   Also, she said she doesn't understand why the appointment on 11/15/23 was cancelled except she remembers going to the doctor recently and was told she didn't have to come back for a year. She has assistance until 04/23/24.

## 2023-11-05 NOTE — Telephone Encounter (Signed)
 Spoke with Anita Diaz at [facility] for Entresto .  ([phone], option #2) pt in need for new RX for Entresto  24-26 mg one PO BID.  Called in for #[phone] X 3. Called verbal into [facility] who will process the RX and send to pt.   Pt is aware.  She will call back if any questions or concerns.

## 2023-11-07 ENCOUNTER — Encounter: Payer: Self-pay | Admitting: Family

## 2023-11-07 ENCOUNTER — Ambulatory Visit (INDEPENDENT_AMBULATORY_CARE_PROVIDER_SITE_OTHER): Admitting: Family

## 2023-11-07 ENCOUNTER — Ambulatory Visit: Payer: Self-pay

## 2023-11-07 VITALS — BP 122/77 | HR 76 | Temp 97.7°F | Ht 62.0 in | Wt 141.1 lb

## 2023-11-07 DIAGNOSIS — R109 Unspecified abdominal pain: Secondary | ICD-10-CM

## 2023-11-07 DIAGNOSIS — B369 Superficial mycosis, unspecified: Secondary | ICD-10-CM | POA: Diagnosis not present

## 2023-11-07 LAB — POCT URINALYSIS DIPSTICK
Bilirubin, UA: NEGATIVE
Blood, UA: NEGATIVE
Glucose, UA: NEGATIVE
Ketones, UA: NEGATIVE
Nitrite, UA: NEGATIVE
Protein, UA: NEGATIVE
Spec Grav, UA: 1.015 (ref 1.010–1.025)
Urobilinogen, UA: 0.2 U/dL
pH, UA: 6 (ref 5.0–8.0)

## 2023-11-07 MED ORDER — CLOTRIMAZOLE 1 % EX CREA: 1.0000 | TOPICAL_CREAM | Freq: Two times a day (BID) | CUTANEOUS | 1 refills | Status: AC

## 2023-11-07 MED ORDER — NITROFURANTOIN MONOHYD MACRO 100 MG PO CAPS: 100.0000 mg | ORAL_CAPSULE | Freq: Two times a day (BID) | ORAL | 0 refills | Status: DC

## 2023-11-07 NOTE — Telephone Encounter (Signed)
 Noted. Patient seeing [facility] today at 4pm.
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
## 2023-11-07 NOTE — Telephone Encounter (Signed)
 FYI Only or Action Required?: FYI only for provider.  Patient was last seen in primary care on 09/04/2023 by Kennyth Worth HERO, MD.  Called Nurse Triage reporting Flank Pain.  Symptoms began several weeks ago.  Interventions attempted: Nothing.  Symptoms are: gradually worsening.  Triage Disposition: No disposition on file.  Patient/caregiver understands and will follow disposition?:     Copied from CRM [id number]. Topic: Clinical - Red Word Triage >> Nov 07, 2023  2:16 PM Jasmin G wrote: Red Word that prompted transfer to Nurse Triage: Pt. suspects she has kidney infection, she's experiencing a severe back ache on the right side. Pt. Has had issues with her kidneys since childhood. Reason for Disposition  MODERATE pain (e.g., interferes with normal activities or awakens from sleep)  Answer Assessment - Initial Assessment Questions 1. LOCATION: Where does it hurt? (e.g., left, right)     Right side 2. ONSET: When did the pain start?     At 2 weeks 3. SEVERITY: How bad is the pain? (e.g., Scale 1-10; mild, moderate, or severe) severe     Severe back pain on right side; states hx of kidney infections 4. PATTERN: Does the pain come and go, or is it constant?      constant 5. CAUSE: What do you think is causing the pain?     Kidney infection 6. OTHER SYMPTOMS:  Do you have any other symptoms? (e.g., fever, abdomen pain, vomiting, leg weakness, burning with urination, blood in urine)     no 7. PREGNANCY:  Is there any chance you are pregnant? When was your last menstrual period?     na  Protocols used: Flank Pain-A-AH

## 2023-11-09 ENCOUNTER — Ambulatory Visit: Payer: Self-pay | Admitting: Family

## 2023-11-09 LAB — URINE CULTURE
MICRO NUMBER:: 16706578
SPECIMEN QUALITY:: ADEQUATE

## 2023-11-09 NOTE — Progress Notes (Signed)
 Hi Krisa,  The urine culture indicates the antibiotic I gave you should treat your infection. Hope you are feeling better!

## 2023-11-13 NOTE — Progress Notes (Signed)
 Antibiotic still in her system for 1-2d after finishing. She should f/u w/PCP if pain persisting after that time. Must hydrate with at least 2 liters of water  = 4-5 16.9oz water  bottles. Thx

## 2023-11-15 ENCOUNTER — Ambulatory Visit: Admitting: Cardiology

## 2023-11-19 ENCOUNTER — Other Ambulatory Visit: Payer: Self-pay | Admitting: Family Medicine

## 2023-11-19 ENCOUNTER — Ambulatory Visit (INDEPENDENT_AMBULATORY_CARE_PROVIDER_SITE_OTHER): Admitting: Family Medicine

## 2023-11-19 VITALS — BP 109/72 | HR 55 | Temp 97.9°F | Ht 62.0 in | Wt 141.2 lb

## 2023-11-19 DIAGNOSIS — N390 Urinary tract infection, site not specified: Secondary | ICD-10-CM | POA: Diagnosis not present

## 2023-11-19 DIAGNOSIS — B379 Candidiasis, unspecified: Secondary | ICD-10-CM | POA: Diagnosis not present

## 2023-11-19 DIAGNOSIS — I10 Essential (primary) hypertension: Secondary | ICD-10-CM

## 2023-11-19 DIAGNOSIS — F321 Major depressive disorder, single episode, moderate: Secondary | ICD-10-CM | POA: Diagnosis not present

## 2023-11-19 LAB — POCT URINALYSIS DIPSTICK
Bilirubin, UA: NEGATIVE
Blood, UA: POSITIVE
Glucose, UA: NEGATIVE
Ketones, UA: NEGATIVE
Leukocytes, UA: NEGATIVE
Nitrite, UA: NEGATIVE
Protein, UA: NEGATIVE
Spec Grav, UA: 1.015 (ref 1.010–1.025)
Urobilinogen, UA: 0.2 U/dL
pH, UA: 6 (ref 5.0–8.0)

## 2023-11-19 MED ORDER — KETOCONAZOLE 2 % EX CREA
1.0000 | TOPICAL_CREAM | Freq: Two times a day (BID) | CUTANEOUS | 0 refills | Status: DC
Start: 2023-11-19 — End: 2023-11-20

## 2023-11-19 MED ORDER — DULOXETINE HCL 30 MG PO CPEP: 30.0000 mg | ORAL_CAPSULE | Freq: Every day | ORAL | 0 refills | Status: DC

## 2023-11-19 MED ORDER — FLUCONAZOLE 150 MG PO TABS: 150.0000 mg | ORAL_TABLET | ORAL | 0 refills | Status: DC | PRN

## 2023-11-19 MED ORDER — CEPHALEXIN 500 MG PO CAPS: 500.0000 mg | ORAL_CAPSULE | Freq: Two times a day (BID) | ORAL | 0 refills | Status: AC

## 2023-11-19 NOTE — Assessment & Plan Note (Signed)
 We did review patient's medication list today.  She does overall feel like her mood has been reasonably well-controlled though would like to wean off medications as possible.  She is currently on both Cymbalta  60 mg daily and Zoloft  100 mg daily.  She does have some issues with excessive fatigue and somnolence though no other signs or symptoms of serotonin syndrome.  It would be reasonable to wean her off of the Cymbalta  at this point.  We will go to 30 mg daily.  She will follow-up with us  in 2 weeks we can continue to wean off as tolerated.  She will continue on Zoloft  100 mg daily and Wellbutrin  150 mg daily.

## 2023-11-19 NOTE — Patient Instructions (Signed)
 It was very nice to see you today!  Please start the Keflex .  Use the ketoconazole  ointment.  Start the Diflucan  if your rash does not improve.  Please decrease your Cymbalta  to 30 mg daily.  Follow-up with us  in a couple weeks to let us  know how you are doing.  Return if symptoms worsen or fail to improve.   Take care, Dr Kennyth  PLEASE NOTE:  If you had any lab tests, please let us  know if you have not heard back within a few days. You may see your results on mychart before we have a chance to review them but we will give you a call once they are reviewed by us .   If we ordered any referrals today, please let us  know if you have not heard from their office within the next week.   If you had any urgent prescriptions sent in today, please check with the pharmacy within an hour of our visit to make sure the prescription was transmitted appropriately.   Please try these tips to maintain a healthy lifestyle:  Eat at least 3 REAL meals and 1-2 snacks per day.  Aim for no more than 5 hours between eating.  If you eat breakfast, please do so within one hour of getting up.   Each meal should contain half fruits/vegetables, one quarter protein, and one quarter carbs (no bigger than a computer mouse)  Cut down on sweet beverages. This includes juice, soda, and sweet tea.   Drink at least 1 glass of water  with each meal and aim for at least 8 glasses per day  Exercise at least 150 minutes every week.

## 2023-11-19 NOTE — Progress Notes (Signed)
   Anita Diaz is a 68 y.o. female who presents today for an office visit.  Assessment/Plan:  New/Acute Problems: Urinary tract infection Point-of-care urinalysis today shows positive hemoglobin.  Culture from 12 days ago was positive for E. coli.  She is still having some symptoms so this is improving.  We will start Keflex .  Recheck urine culture today.  Encouraged hydration.  She will let us  know if not improving  Candidal intertrigo Did not have much response to clotrimazole .  Will start ketoconazole  cream.  Will send a prescription in for oral Diflucan  if no improvement with this.  Chronic Problems Addressed Today: Depression, major, single episode, moderate (HCC) We did review patient's medication list today.  She does overall feel like her mood has been reasonably well-controlled though would like to wean off medications as possible.  She is currently on both Cymbalta  60 mg daily and Zoloft  100 mg daily.  She does have some issues with excessive fatigue and somnolence though no other signs or symptoms of serotonin syndrome.  It would be reasonable to wean her off of the Cymbalta  at this point.  We will go to 30 mg daily.  She will follow-up with us  in 2 weeks we can continue to wean off as tolerated.  She will continue on Zoloft  100 mg daily and Wellbutrin  150 mg daily.  Hypertension At goal today on Entresto  24-26 twice daily and Coreg  6.25 mg twice daily per cardiology.     Subjective:  HPI:  See A/P for status of chronic conditions.  Patient is here today for follow-up.  She was seen here by different provider 12 days ago.  Was having flank pain at that time.  Urine culture showed UTI.  She was given Macrobid .  Symptoms have improved though still having some persistent pain and dysuria.  No reported fevers or chills.  Also was having intermittent issues with rash on her breast.  Was started on clotrimazole  cream without any improvement.       Objective:  Physical Exam: BP  109/72   Pulse (!) 55   Temp 97.9 F (36.6 C) (Temporal)   Ht 5' 2 (1.575 m)   Wt 141 lb 3.2 oz (64 kg)   SpO2 98%   BMI 25.83 kg/m   Gen: No acute distress, resting comfortably CV: Regular rate and rhythm with no murmurs appreciated Pulm: Normal work of breathing, clear to auscultation bilaterally with no crackles, wheezes, or rhonchi Neuro: Grossly normal, moves all extremities Psych: Normal affect and thought content      Anita Diaz M. Kennyth, MD 11/19/2023 12:34 PM

## 2023-11-19 NOTE — Assessment & Plan Note (Signed)
 At goal today on Entresto  24-26 twice daily and Coreg  6.25 mg twice daily per cardiology.

## 2023-11-20 ENCOUNTER — Other Ambulatory Visit: Payer: Self-pay | Admitting: *Deleted

## 2023-11-20 LAB — UNLABELED: Test Ordered On Req: 395

## 2023-11-20 MED ORDER — KETOCONAZOLE 2 % EX CREA: 1.0000 | TOPICAL_CREAM | Freq: Two times a day (BID) | CUTANEOUS | 0 refills | Status: AC

## 2023-11-21 ENCOUNTER — Ambulatory Visit: Payer: Self-pay | Admitting: Family Medicine

## 2023-11-21 NOTE — Progress Notes (Signed)
 Can we check with lab on status of her urine culture?

## 2023-11-22 ENCOUNTER — Other Ambulatory Visit (INDEPENDENT_AMBULATORY_CARE_PROVIDER_SITE_OTHER)

## 2023-11-22 ENCOUNTER — Other Ambulatory Visit: Payer: Self-pay | Admitting: *Deleted

## 2023-11-22 DIAGNOSIS — N390 Urinary tract infection, site not specified: Secondary | ICD-10-CM

## 2023-11-22 NOTE — Telephone Encounter (Signed)
 Copied from CRM [id number]. Topic: Clinical - Request for Lab/Test Order >> Nov 21, 2023 11:07 AM Lavanda D wrote: Reason for CRM: Corean with [facility] calling to report a name discrepancy, the urine in her bag from Monday does not have a name on it. She is wondering if it should still be tested or if they would like to recollect. Ref #: [id number] E Call Back #: [phone]  Patient will come in today for retest  [facility]

## 2023-11-24 LAB — URINE CULTURE
MICRO NUMBER:: 16770625
Result:: NO GROWTH
SPECIMEN QUALITY:: ADEQUATE

## 2023-11-26 ENCOUNTER — Ambulatory Visit: Payer: Self-pay | Admitting: Family Medicine

## 2023-11-26 LAB — PAT ID TIQ DOC: Test Affected: 395

## 2023-11-26 LAB — URINE CULTURE
MICRO NUMBER:: 16775758
SPECIMEN QUALITY:: ADEQUATE

## 2023-11-26 NOTE — Progress Notes (Signed)
 Her urine culture is negative however this may be skewed due to her recent course of antibiotics.  She should let us  know if her symptoms are not improving.

## 2023-11-26 NOTE — Progress Notes (Signed)
 Urine culture is negative though she should let us  know if symptoms are not improving.

## 2023-11-29 NOTE — Progress Notes (Signed)
 Ok to send in one more round but recommend visit or urology referral if pain is still not improving.

## 2023-11-30 ENCOUNTER — Other Ambulatory Visit: Payer: Self-pay | Admitting: *Deleted

## 2023-12-07 ENCOUNTER — Ambulatory Visit: Payer: Self-pay

## 2023-12-07 NOTE — Telephone Encounter (Signed)
 Reason for Disposition  . [1] MODERATE back pain (e.g., interferes with normal activities) AND [2] present > 3 days    Protocols used: Back Pain-A-AH

## 2023-12-07 NOTE — Telephone Encounter (Addendum)
 FYI Only or Action Required?: FYI only for provider.  Patient was last seen in primary care on 11/19/2023 by Kennyth Worth HERO, MD.  Called Nurse Triage reporting Flank Pain.  Symptoms began ongoing sx despite 2 rounds abx .  Interventions attempted: Prescription medications: abx.  Symptoms are: unchanged.  Triage Disposition: No disposition on file.  Patient/caregiver understands and will follow disposition?:          Copied from CRM #[id number]. Topic: Clinical - Red Word Triage >> Dec 07, 2023  1:44 PM Martinique E wrote: [facility] that prompted transfer to Nurse Triage: Ongoing kidney issues. Patient is having back pain. Answer Assessment - Initial Assessment Questions 1. ONSET: When did the pain begin? (e.g., minutes, hours, days)     *No Answer* 2. LOCATION: Where does it hurt? (upper, mid or lower back)     Low backk  3. SEVERITY: How bad is the pain?  (e.g., Scale 1-10; mild, moderate, or severe)     5/10 4. PATTERN: Is the pain constant? (e.g., yes, no; constant, intermittent)      yes 5. RADIATION: Does the pain shoot into your legs or somewhere else?     no 8. MEDICINES: What have you taken so far for the pain? (e.g., nothing, acetaminophen , NSAIDS)     *No Answer* 9. NEUROLOGIC SYMPTOMS: Do you have any weakness, numbness, or problems with bowel/bladder control?     *No Answer* 10. OTHER SYMPTOMS: Do you have any other symptoms? (e.g., fever, abdomen pain, burning with urination, blood in urine)       *No Answer* 11. PREGNANCY: Is there any chance you are pregnant? When was your last menstrual period?       *No Answer*  Protocols used: Back Pain-A-AH

## 2023-12-11 ENCOUNTER — Other Ambulatory Visit: Payer: Self-pay | Admitting: *Deleted

## 2023-12-11 DIAGNOSIS — R109 Unspecified abdominal pain: Secondary | ICD-10-CM

## 2023-12-11 NOTE — Telephone Encounter (Signed)
 See note

## 2023-12-11 NOTE — Telephone Encounter (Signed)
 Please see previous note. Recommend urology referral if not improving.

## 2023-12-11 NOTE — Telephone Encounter (Signed)
 Referral Urologist placed Patient aware

## 2023-12-13 ENCOUNTER — Ambulatory Visit (INDEPENDENT_AMBULATORY_CARE_PROVIDER_SITE_OTHER): Payer: Medicare Other

## 2023-12-13 DIAGNOSIS — I428 Other cardiomyopathies: Secondary | ICD-10-CM | POA: Diagnosis not present

## 2023-12-13 LAB — CUP PACEART REMOTE DEVICE CHECK
Battery Remaining Longevity: 78 mo
Battery Remaining Percentage: 90 %
Brady Statistic RA Percent Paced: 1 %
Brady Statistic RV Percent Paced: 2 %
Date Time Interrogation Session: 20250821044200
HighPow Impedance: 72 Ohm
Lead Channel Impedance Value: 419 Ohm
Lead Channel Impedance Value: 451 Ohm
Lead Channel Impedance Value: 547 Ohm
Lead Channel Setting Pacing Amplitude: 2 V
Lead Channel Setting Pacing Amplitude: 2.4 V
Lead Channel Setting Pacing Amplitude: 2.6 V
Lead Channel Setting Pacing Pulse Width: 0.4 ms
Lead Channel Setting Pacing Pulse Width: 0.4 ms
Lead Channel Setting Sensing Sensitivity: 0.5 mV
Lead Channel Setting Sensing Sensitivity: 1 mV
Pulse Gen Serial Number: 215046
Zone Setting Status: 755011

## 2023-12-15 ENCOUNTER — Other Ambulatory Visit: Payer: Self-pay | Admitting: Family Medicine

## 2023-12-16 ENCOUNTER — Ambulatory Visit: Payer: Self-pay | Admitting: Internal Medicine

## 2023-12-25 ENCOUNTER — Other Ambulatory Visit: Payer: Self-pay | Admitting: Family Medicine

## 2023-12-26 DIAGNOSIS — M19012 Primary osteoarthritis, left shoulder: Secondary | ICD-10-CM | POA: Diagnosis not present

## 2023-12-31 ENCOUNTER — Other Ambulatory Visit: Payer: Self-pay | Admitting: Family Medicine

## 2024-01-05 ENCOUNTER — Other Ambulatory Visit: Payer: Self-pay | Admitting: Family Medicine

## 2024-01-15 DIAGNOSIS — R3912 Poor urinary stream: Secondary | ICD-10-CM | POA: Diagnosis not present

## 2024-01-15 DIAGNOSIS — N3020 Other chronic cystitis without hematuria: Secondary | ICD-10-CM | POA: Diagnosis not present

## 2024-01-15 DIAGNOSIS — R3915 Urgency of urination: Secondary | ICD-10-CM | POA: Diagnosis not present

## 2024-01-15 DIAGNOSIS — N952 Postmenopausal atrophic vaginitis: Secondary | ICD-10-CM | POA: Diagnosis not present

## 2024-01-16 ENCOUNTER — Other Ambulatory Visit: Payer: Self-pay | Admitting: Family Medicine

## 2024-01-16 NOTE — Progress Notes (Signed)
Remote ICD Transmission.

## 2024-01-23 ENCOUNTER — Other Ambulatory Visit: Payer: Self-pay | Admitting: Family Medicine

## 2024-01-28 ENCOUNTER — Ambulatory Visit: Payer: Self-pay

## 2024-01-28 NOTE — Telephone Encounter (Signed)
 FYI Only or Action Required?: FYI only for provider.  Patient was last seen in primary care on 11/19/2023 by Kennyth Worth HERO, MD.  Called Nurse Triage reporting Hip Injury.  Symptoms began about a month ago.  Interventions attempted: OTC medications: Tylenol  and Prescription medications: Muscle relaxer.  Symptoms are: gradually worsening.  Triage Disposition: See PCP When Office is Open (Within 3 Days)  Patient/caregiver understands and will follow disposition?: Yes    Copied from CRM [id number]. Topic: Clinical - Red Word Triage >> Jan 28, 2024  1:56 PM Macario HERO wrote: Red Word that prompted transfer to Nurse Triage: Patient fell 4 weeks ago, left ankle and left hip still in pain with difficulty working. Reason for Disposition  [1] After 2 weeks AND [2] still painful or swollen  Answer Assessment - Initial Assessment Questions Patient denies swelling to area and currently taking Tylenol  and muscle relaxer for symptoms.  1. MECHANISM: How did the injury happen? (e.g., twisting injury, direct blow)      Fall  2. ONSET: When did the injury happen? (e.g., minutes, hours ago)      4 weeks ago  3. LOCATION: Where is the injury located?      L hip and L ankle area  4. APPEARANCE of INJURY: What does the injury look like?  (e.g., deformity of leg)     Normal looking denies bruising or redness  5. SEVERITY: Can you put weight on that leg? Can you walk?      Yes can put weight on the leg  6. SIZE: For cuts, bruises, or swelling, ask: How large is it? (e.g., inches or centimeters;  entire joint)      Denies  7. PAIN: Is there pain? If Yes, ask: How bad is the pain?   What does it keep you from doing? (Scale 0-10; or none, mild, moderate, severe)     8/10 9. OTHER SYMPTOMS: Do you have any other symptoms?      Denies  Protocols used: Hip Injury-A-AH

## 2024-01-28 NOTE — Telephone Encounter (Signed)
 Patient has an OV with PCP on 01/30/2024

## 2024-01-30 ENCOUNTER — Encounter: Payer: Self-pay | Admitting: Family Medicine

## 2024-01-30 ENCOUNTER — Ambulatory Visit: Admitting: Family Medicine

## 2024-01-30 VITALS — BP 94/60 | HR 62 | Temp 97.9°F | Ht 62.0 in | Wt 140.4 lb

## 2024-01-30 DIAGNOSIS — I10 Essential (primary) hypertension: Secondary | ICD-10-CM | POA: Diagnosis not present

## 2024-01-30 DIAGNOSIS — K219 Gastro-esophageal reflux disease without esophagitis: Secondary | ICD-10-CM | POA: Diagnosis not present

## 2024-01-30 DIAGNOSIS — R1013 Epigastric pain: Secondary | ICD-10-CM

## 2024-01-30 DIAGNOSIS — M79605 Pain in left leg: Secondary | ICD-10-CM | POA: Diagnosis not present

## 2024-01-30 MED ORDER — NITROGLYCERIN 0.4 MG SL SUBL: SUBLINGUAL_TABLET | SUBLINGUAL | 1 refills | Status: AC

## 2024-01-30 MED ORDER — METHYLPREDNISOLONE ACETATE 40 MG/ML IJ SUSP
40.0000 mg | Freq: Once | INTRAMUSCULAR | Status: AC
  Administered 2024-01-30: 80 mg via INTRAMUSCULAR

## 2024-01-30 NOTE — Assessment & Plan Note (Signed)
 Blood pressure at goal today on Entresto  24-26 twice daily and Coreg  6.25 mg twice daily per cardiology.

## 2024-01-30 NOTE — Patient Instructions (Addendum)
 It was very nice to see you today!  VISIT SUMMARY: You visited us  today due to pain in your hip, knee, and ankle following a fall, as well as gastrointestinal symptoms like nausea and lack of appetite.  YOUR PLAN: RIGHT HIP BURSITIS AND CONTUSION: You have inflammation in your right hip that is causing pain extending to your knee and ankle. -We administered a cortisone injection to your right hip to help reduce the inflammation. -We provided you with exercises and stretches to help manage the pain. -If your symptoms persist, we may refer you to physical therapy.  OSTEOARTHRITIS OF RIGHT KNEE: You have osteoarthritis in your right knee, which is contributing to your pain.  NAUSEA AND BLOATING, POSSIBLE GASTRITIS OR PEPTIC ULCER DISEASE: Your chronic nausea and bloating may be due to gastritis or peptic ulcer disease. -We have doubled the dose of your Protonix  to help manage your symptoms. -If your symptoms persist, we may consider an abdominal ultrasound. -If there is no improvement, we may evaluate the need for an upper endoscopy.  GASTROESOPHAGEAL REFLUX DISEASE (GERD): Your GERD symptoms are generally well-controlled with occasional heartburn.  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 -We have refilled your nitroglycerin  prescription with instructions for three separate bottles for your convenience.  Return if symptoms worsen or fail to improve.   Take care, Dr Kennyth  PLEASE NOTE:  If you had any lab tests, please let us  know if you have not heard back within a few days. You may see your results on mychart before we have a chance to review them but we will give you a call once they are reviewed by us .   If we ordered any referrals today, please let us  know if you have not heard from their office within the next week.   If you had any urgent prescriptions sent in today, please check with the pharmacy within an hour of our visit to make sure  the prescription was transmitted appropriately.   Please try these tips to maintain a healthy lifestyle:  Eat at least 3 REAL meals and 1-2 snacks per day.  Aim for no more than 5 hours between eating.  If you eat breakfast, please do so within one hour of getting up.   Each meal should contain half fruits/vegetables, one quarter protein, and one quarter carbs (no bigger than a computer mouse)  Cut down on sweet beverages. This includes juice, soda, and sweet tea.   Drink at least 1 glass of water  with each meal and aim for at least 8 glasses per day  Exercise at least 150 minutes every week.

## 2024-01-30 NOTE — Progress Notes (Signed)
 Anita Diaz is a 68 y.o. female who presents today for an office visit.  Assessment/Plan:  New/Acute Problems: Left Leg Pain  Exam consistent with trochanteric bursitis.  She did have a fall a few weeks ago however low suspicion for fracture at this point.  We did discuss imaging however we will off on this for now.  She cannot take NSAIDs due to her being anticoagulated and also due to her heart issues.  We did discuss prednisone  burst however she would like to hold off on this as she has had bad experiences with this in the past.  We also discussed physical therapy referral however she declined.  She has previously done well with steroid injections.  We will give 80 mg of Depo-Medrol  today.  Also discussed home exercise program and handout was given.  She will let us  know if not proving in a couple of weeks and would consider referral to physical therapy or sports medicine at that time.  We discussed reasons to return to care.  Follow-up as needed.  Epigastric Pain / Nausea This has been persistent for several months.  Differential includes biliary colic, PUD, pancreatitis, etc.  She is concerned that she may be developing a stomach ulcer.  She will increase her Protonix  to 40 mg twice daily.  We did discuss further workup including labs and imaging however she would like to hold off on this for now.  She will let us  know if symptoms do not improve in the next couple of weeks and would consider right upper quadrant ultrasound versus referral to GI at that time.  We discussed reasons to return to care.  Chronic Problems Addressed Today: GERD (gastroesophageal reflux disease) On Protonix  40 mg daily though we will be increasing to twice daily for a few weeks as above to see if this improves her epigastric pain.  Hypertension Blood pressure at goal today on Entresto  24-26 twice daily and Coreg  6.25 mg twice daily per cardiology.     Subjective:  HPI:  See assessment / plan for status of  chronic conditions.    Discussed the use of AI scribe software for clinical note transcription with the patient, who gave verbal consent to proceed.  History of Present Illness Anita Diaz is a 68 year old female who presents with hip, knee, and ankle pain following a fall.  She has been experiencing pain in her hip, knee, and ankle following a fall approximately four weeks ago. She tripped over an object and landed hard on her hip while trying to protect her shoulder. The pain is located on the outer part of her hip and radiates down to her knee and ankle, worsening throughout the day. She has attempted to manage the pain with [facility], but it has not been effective. She reports that the pain has been getting worse over the past few weeks.  In addition to the pain, she experiences gastrointestinal symptoms characterized by persistent nausea and a lack of appetite. She has been taking an acid blocker and a nausea medication, but the symptoms persist. Nausea occurs approximately three times a week, sometimes with a sensation of impending vomiting, though she has not vomited. No specific triggers have been identified, and she has been trying to eat a blander diet. She reports occasional heartburn and reflux, which are generally well-controlled with her current medication regimen. She has not had a recent colonoscopy or upper endoscopy.         Objective:  Physical  Exam: BP 94/60   Pulse 62   Temp 97.9 F (36.6 C) (Temporal)   Ht 5' 2 (1.575 m)   Wt 140 lb 6.4 oz (63.7 kg)   SpO2 95%   BMI 25.68 kg/m   Gen: No acute distress, resting comfortably CV: Regular rate and rhythm with no murmurs appreciated Pulm: Normal work of breathing, clear to auscultation bilaterally with no crackles, wheezes, or rhonchi Abdomen: Soft, nontender, nondistended.  Bowel sounds present MUSCULOSKELETAL: - Left Leg: No deformities.  Tender to palpation along posterior aspect of greater trochanter.  Limited  internal and external rotation hip secondary to pain.  Left knee without abnormality.  Crepitus on active and passive extension and flexion.  Neurovascular intact distally. Neuro: Grossly normal, moves all extremities Psych: Normal affect and thought content      Anita Diaz M. Kennyth, MD 01/30/2024 10:38 AM

## 2024-01-30 NOTE — Addendum Note (Signed)
 Addended by: IDA ELORA HERO on: 01/30/2024 10:50 AM   Modules accepted: Orders

## 2024-01-30 NOTE — Assessment & Plan Note (Signed)
 On Protonix  40 mg daily though we will be increasing to twice daily for a few weeks as above to see if this improves her epigastric pain.

## 2024-02-01 ENCOUNTER — Telehealth: Payer: Self-pay | Admitting: Pharmacy Technician

## 2024-02-01 ENCOUNTER — Other Ambulatory Visit (HOSPITAL_COMMUNITY): Payer: Self-pay

## 2024-02-01 ENCOUNTER — Telehealth: Payer: Self-pay | Admitting: Cardiology

## 2024-02-01 NOTE — Telephone Encounter (Signed)
 Pharmacy Patient Advocate Encounter  Insurance verification completed.   The patient is insured through [employer] test claim for entresto  generic. Currently a quantity of 60 is a 30 day supply and the co-pay is $45.00 .  This test claim was processed through [facility]- copay amounts may vary at other pharmacies due to pharmacy/plan contracts, or as the patient moves through the different stages of their insurance plan.

## 2024-02-01 NOTE — Telephone Encounter (Signed)
 Patient returned RN's call regarding medication.

## 2024-02-01 NOTE — Telephone Encounter (Signed)
 Pt c/o medication issue:  1. Name of Medication: sacubitril -valsartan  (ENTRESTO ) 24-26 MG   2. How are you currently taking this medication (dosage and times per day)?   3. Are you having a reaction (difficulty breathing--STAT)? No  4. What is your medication issue? Patient states she received a letter yesterday from [employer]. They state that as of January 1st, they will no longer give her the Entresto . She would like to discuss this and see if there is a cheaper option - she states that it is <$400 for 3 months. She states this is not manageable on 1 income a month.

## 2024-02-01 NOTE — Telephone Encounter (Signed)
 Left message with call back number- informed that generic Entresto  will be $45 a month  Billy, Crystal D, CPhT to [facility] Triage (Selected Message)     02/01/24  2:03 PM Generic is $45.00 for one month Palo, Ronal SQUIBB, RN to Rx Med Assistance Team     02/01/24  1:35 PM Would the generic version be cheaper or will it still be costly?   Keven, RN

## 2024-02-01 NOTE — Telephone Encounter (Signed)
 S/w the pt and gave the information. She reports that she will not need it until January (end of Dec) Asked her to call us  when she needs the Generic Entresto  sent in. She verbalized understanding

## 2024-02-14 ENCOUNTER — Ambulatory Visit: Attending: Internal Medicine | Admitting: Internal Medicine

## 2024-02-14 VITALS — BP 118/60 | HR 60 | Ht 62.0 in | Wt 142.4 lb

## 2024-02-14 DIAGNOSIS — I5022 Chronic systolic (congestive) heart failure: Secondary | ICD-10-CM | POA: Diagnosis not present

## 2024-02-14 NOTE — Patient Instructions (Signed)
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
## 2024-02-17 ENCOUNTER — Other Ambulatory Visit: Payer: Self-pay | Admitting: Family Medicine

## 2024-03-02 ENCOUNTER — Other Ambulatory Visit: Payer: Self-pay | Admitting: Family Medicine

## 2024-03-13 ENCOUNTER — Ambulatory Visit: Payer: Self-pay | Admitting: Internal Medicine

## 2024-03-13 ENCOUNTER — Ambulatory Visit

## 2024-03-13 DIAGNOSIS — I5022 Chronic systolic (congestive) heart failure: Secondary | ICD-10-CM

## 2024-03-13 LAB — CUP PACEART REMOTE DEVICE CHECK
Battery Remaining Longevity: 78 mo
Battery Remaining Percentage: 85 %
Brady Statistic RA Percent Paced: 1 %
Brady Statistic RV Percent Paced: 0 %
Date Time Interrogation Session: 20251120044100
HighPow Impedance: 69 Ohm
Lead Channel Impedance Value: 416 Ohm
Lead Channel Impedance Value: 419 Ohm
Lead Channel Impedance Value: 525 Ohm
Lead Channel Setting Pacing Amplitude: 2 V
Lead Channel Setting Pacing Amplitude: 2.4 V
Lead Channel Setting Pacing Amplitude: 2.6 V
Lead Channel Setting Pacing Pulse Width: 0.4 ms
Lead Channel Setting Pacing Pulse Width: 0.4 ms
Lead Channel Setting Sensing Sensitivity: 0.5 mV
Lead Channel Setting Sensing Sensitivity: 1 mV
Pulse Gen Serial Number: 215046
Zone Setting Status: 755011

## 2024-03-17 NOTE — Progress Notes (Signed)
 Remote ICD Transmission

## 2024-03-19 ENCOUNTER — Other Ambulatory Visit: Payer: Self-pay | Admitting: Family Medicine

## 2024-03-23 ENCOUNTER — Other Ambulatory Visit: Payer: Self-pay | Admitting: Family Medicine

## 2024-03-27 DIAGNOSIS — R300 Dysuria: Secondary | ICD-10-CM | POA: Diagnosis not present

## 2024-03-27 DIAGNOSIS — N1330 Unspecified hydronephrosis: Secondary | ICD-10-CM | POA: Diagnosis not present

## 2024-03-27 DIAGNOSIS — R1010 Upper abdominal pain, unspecified: Secondary | ICD-10-CM | POA: Diagnosis not present

## 2024-03-27 DIAGNOSIS — N131 Hydronephrosis with ureteral stricture, not elsewhere classified: Secondary | ICD-10-CM | POA: Diagnosis not present

## 2024-03-30 ENCOUNTER — Other Ambulatory Visit: Payer: Self-pay | Admitting: Family Medicine

## 2024-04-06 ENCOUNTER — Other Ambulatory Visit: Payer: Self-pay | Admitting: Family Medicine

## 2024-04-08 DIAGNOSIS — N131 Hydronephrosis with ureteral stricture, not elsewhere classified: Secondary | ICD-10-CM | POA: Diagnosis not present

## 2024-04-10 ENCOUNTER — Ambulatory Visit: Admitting: Physician Assistant

## 2024-04-13 ENCOUNTER — Other Ambulatory Visit: Payer: Self-pay | Admitting: Family Medicine

## 2024-04-20 ENCOUNTER — Other Ambulatory Visit: Payer: Self-pay | Admitting: Family Medicine

## 2024-04-25 ENCOUNTER — Other Ambulatory Visit: Payer: Self-pay | Admitting: Family Medicine

## 2024-05-05 ENCOUNTER — Other Ambulatory Visit: Payer: Self-pay | Admitting: Family Medicine

## 2024-06-12 ENCOUNTER — Encounter

## 2024-07-08 ENCOUNTER — Encounter

## 2024-09-11 ENCOUNTER — Encounter

## 2024-12-11 ENCOUNTER — Encounter

## 2025-03-12 ENCOUNTER — Encounter
